# Patient Record
Sex: Female | Born: 1955
Health system: Southern US, Community
[De-identification: ages and names within clinical notes are randomized; demographics above are authoritative.]

## PROBLEM LIST (undated history)

## (undated) DIAGNOSIS — K219 Gastro-esophageal reflux disease without esophagitis: Secondary | ICD-10-CM

## (undated) DIAGNOSIS — E785 Hyperlipidemia, unspecified: Secondary | ICD-10-CM

## (undated) DIAGNOSIS — R1110 Vomiting, unspecified: Secondary | ICD-10-CM

## (undated) DIAGNOSIS — I10 Essential (primary) hypertension: Secondary | ICD-10-CM

## (undated) HISTORY — PX: TUBAL LIGATION: SHX77

## (undated) HISTORY — PX: COLONOSCOPY: SHX174

## (undated) HISTORY — DX: Vomiting, unspecified: R11.10

## (undated) HISTORY — DX: Essential (primary) hypertension: I10

## (undated) HISTORY — DX: Hyperlipidemia, unspecified: E78.5

## (undated) HISTORY — DX: Gastro-esophageal reflux disease without esophagitis: K21.9

---

## 2001-05-01 ENCOUNTER — Ambulatory Visit (HOSPITAL_COMMUNITY): Admission: RE | Admit: 2001-05-01 | Discharge: 2001-05-01 | Payer: Self-pay

## 2001-05-01 ENCOUNTER — Encounter: Payer: Self-pay | Admitting: Internal Medicine

## 2001-11-28 ENCOUNTER — Other Ambulatory Visit: Admission: RE | Admit: 2001-11-28 | Discharge: 2001-11-28 | Payer: Self-pay | Admitting: Obstetrics and Gynecology

## 2002-05-04 ENCOUNTER — Encounter: Payer: Self-pay | Admitting: Internal Medicine

## 2002-05-04 ENCOUNTER — Ambulatory Visit (HOSPITAL_COMMUNITY): Admission: RE | Admit: 2002-05-04 | Discharge: 2002-05-04 | Payer: Self-pay | Admitting: Internal Medicine

## 2003-05-05 ENCOUNTER — Ambulatory Visit (HOSPITAL_COMMUNITY): Admission: RE | Admit: 2003-05-05 | Discharge: 2003-05-05 | Payer: Self-pay | Admitting: Internal Medicine

## 2003-05-05 ENCOUNTER — Encounter: Payer: Self-pay | Admitting: Internal Medicine

## 2004-05-08 ENCOUNTER — Ambulatory Visit (HOSPITAL_COMMUNITY): Admission: RE | Admit: 2004-05-08 | Discharge: 2004-05-08 | Payer: Self-pay | Admitting: Internal Medicine

## 2004-11-23 ENCOUNTER — Ambulatory Visit (HOSPITAL_COMMUNITY): Admission: RE | Admit: 2004-11-23 | Discharge: 2004-11-23 | Payer: Self-pay | Admitting: Internal Medicine

## 2004-11-23 ENCOUNTER — Ambulatory Visit: Payer: Self-pay | Admitting: Internal Medicine

## 2005-05-09 ENCOUNTER — Ambulatory Visit (HOSPITAL_COMMUNITY): Admission: RE | Admit: 2005-05-09 | Discharge: 2005-05-09 | Payer: Self-pay | Admitting: Internal Medicine

## 2006-05-14 ENCOUNTER — Ambulatory Visit (HOSPITAL_COMMUNITY): Admission: RE | Admit: 2006-05-14 | Discharge: 2006-05-14 | Payer: Self-pay | Admitting: Internal Medicine

## 2007-05-16 ENCOUNTER — Ambulatory Visit (HOSPITAL_COMMUNITY): Admission: RE | Admit: 2007-05-16 | Discharge: 2007-05-16 | Payer: Self-pay | Admitting: Internal Medicine

## 2008-02-04 ENCOUNTER — Other Ambulatory Visit: Admission: RE | Admit: 2008-02-04 | Discharge: 2008-02-04 | Payer: Self-pay | Admitting: Obstetrics and Gynecology

## 2008-05-24 ENCOUNTER — Ambulatory Visit (HOSPITAL_COMMUNITY): Admission: RE | Admit: 2008-05-24 | Discharge: 2008-05-24 | Payer: Self-pay | Admitting: Internal Medicine

## 2009-02-28 ENCOUNTER — Other Ambulatory Visit: Admission: RE | Admit: 2009-02-28 | Discharge: 2009-02-28 | Payer: Self-pay | Admitting: Obstetrics and Gynecology

## 2009-05-25 ENCOUNTER — Ambulatory Visit (HOSPITAL_COMMUNITY): Admission: RE | Admit: 2009-05-25 | Discharge: 2009-05-25 | Payer: Self-pay | Admitting: Internal Medicine

## 2010-04-18 ENCOUNTER — Other Ambulatory Visit: Admission: RE | Admit: 2010-04-18 | Discharge: 2010-04-18 | Payer: Self-pay | Admitting: Obstetrics and Gynecology

## 2010-05-29 ENCOUNTER — Ambulatory Visit (HOSPITAL_COMMUNITY): Admission: RE | Admit: 2010-05-29 | Discharge: 2010-05-29 | Payer: Self-pay | Admitting: Internal Medicine

## 2010-10-12 ENCOUNTER — Ambulatory Visit (HOSPITAL_COMMUNITY): Admission: RE | Admit: 2010-10-12 | Discharge: 2010-10-12 | Payer: Self-pay | Admitting: Internal Medicine

## 2010-10-17 HISTORY — PX: ESOPHAGOGASTRODUODENOSCOPY: SHX1529

## 2010-11-07 ENCOUNTER — Ambulatory Visit: Payer: Self-pay | Admitting: Internal Medicine

## 2010-11-07 DIAGNOSIS — K219 Gastro-esophageal reflux disease without esophagitis: Secondary | ICD-10-CM

## 2010-11-07 DIAGNOSIS — I10 Essential (primary) hypertension: Secondary | ICD-10-CM

## 2010-11-07 HISTORY — DX: Gastro-esophageal reflux disease without esophagitis: K21.9

## 2010-11-07 HISTORY — DX: Essential (primary) hypertension: I10

## 2010-11-08 ENCOUNTER — Ambulatory Visit: Payer: Self-pay | Admitting: Internal Medicine

## 2010-11-08 ENCOUNTER — Ambulatory Visit (HOSPITAL_COMMUNITY): Admission: RE | Admit: 2010-11-08 | Discharge: 2010-11-08 | Payer: Self-pay | Admitting: Internal Medicine

## 2010-11-13 ENCOUNTER — Encounter (HOSPITAL_COMMUNITY)
Admission: RE | Admit: 2010-11-13 | Discharge: 2010-12-13 | Payer: Self-pay | Source: Home / Self Care | Attending: Internal Medicine | Admitting: Internal Medicine

## 2010-11-16 ENCOUNTER — Ambulatory Visit (HOSPITAL_COMMUNITY): Admission: RE | Admit: 2010-11-16 | Discharge: 2010-11-16 | Payer: Self-pay | Admitting: Internal Medicine

## 2010-12-25 ENCOUNTER — Ambulatory Visit
Admission: RE | Admit: 2010-12-25 | Discharge: 2010-12-25 | Payer: Self-pay | Source: Home / Self Care | Attending: Internal Medicine | Admitting: Internal Medicine

## 2011-01-10 HISTORY — PX: BRAVO PH STUDY: SHX5421

## 2011-03-19 ENCOUNTER — Ambulatory Visit (INDEPENDENT_AMBULATORY_CARE_PROVIDER_SITE_OTHER): Payer: 59 | Admitting: Internal Medicine

## 2011-03-19 DIAGNOSIS — R220 Localized swelling, mass and lump, head: Secondary | ICD-10-CM

## 2011-04-02 NOTE — Consult Note (Signed)
NAMEEARLY, ORD                ACCOUNT NO.:  [id number]  MEDICAL RECORD NO.:  [id number]           PATIENT TYPE: AMB.  LOCATION: [place].                    [facility]  PHYSICIAN:  Lionel December, M.D.    DATE OF BIRTH:  18-Feb-1956  DATE :  03/19/2011                                 CONSULTATION   PRESENTING COMPLAINT:  Persistent problems with throat, feeling of lump and/or fluid.  SUBJECTIVE:  Dawn Stafford is a 55 year old African American female, patient of Dr. Carylon Stafford, who is here for a scheduled visit.  She was initially seen in [facility] office in November for throat symptoms.  She states symptoms began soon after she had oral surgery in November 2009.  She had 2 first teeth extracted along with excision of a bone cyst.  Ever since she has had what she described as fizz in her throat.  She also feels as if she has a lump in her throat or fluid.  Only time she does not have this symptom is when she is actually eating food or drinking fluids.  Lot of times, she has to spit it, it is always clear frothy liquid. Occasionally, she may see food particles.  She states she has been able to sleep though.  She was treated for GERD by Dr. Ouida Stafford, but it did not make any difference.  She had upper GI series in October 2011 and there was a question of tiny Zenker's diverticulum and she had single episode of the GE reflux.  She underwent esophagogastrojejunoscopy in November 2011 and it was a normal exam.  She was subsequently evaluated by Ms. Dawn Stafford of speech pathology and no abnormality was noted.  She did have modified barium swallow. Finally, she was referred to Dawn Stafford, where she had esophageal pH study and impedance on January 10, 2011.  This study was performed off PPI and it was a normal study.  I talked with Dr. Ouida Stafford and he decided to treat her with gabapentin thinking that she may have a glossopharyngeal neuropathy or nerve damage.  Dawn Stafford states that she was begun  on Dipentum 3 weeks ago and she is feeling better.  She states her symptom is not as intense and she is able to rest better at night.  She has not experienced any side effects with therapy.  She still brings out frothy liquid at times.  She is not having any dysphagia.  She states she seemed to have less problem when she is lying supine and also when she is uses chewing gum.  She is maintaining her weight though.  CURRENT MEDICATIONS:  Hydrochlorothiazide 25 mg daily, simvastatin 40 mg daily, Xanax 0.5 mg t.i.d. p.r.n., ASA 81 mg daily, gabapentin 300 mg p.o. b.i.d.  OBJECTIVE:  VITAL SIGNS:  Weight 117 pounds, she 66 inches tall, pulse 68 per minute, blood pressure 98/70, temperature is 98. HEENT:  Conjunctivae are pink.  Sclerae are nonicteric.  Oral pharyngeal mucosa is normal.  Pharyngeal reflex is intact. NECK:  No neck masses or thyromegaly noted. ABDOMEN:  Flat, soft, and nontender without organomegaly or masses.  ASSESSMENT:  Persistent feeling of lump in her throat as  well as inability to clear liquids or saliva from her throat.  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  I suspect it must be related to oral surgery that she had in November 2009.  She has had fairly extensive workup and number of conditions have been ruled out.  It is possible that her symptoms may gradually resolve.  PLAN:  She will continue gabapentin at present dose.  Unless she has problem, she will return for OV in 4 months.  If symptoms persist, may consider repeating a barium study in 1 year.     Lionel December, M.D.     NR/MEDQ  D:  03/20/2011  T:  03/20/2011  Job:  [id number]  cc:   Kingsley Callander. Dawn Sills, MD Fax: [phone]  Electronically Signed by Lionel December M.D. on 04/01/2011 11:20:23 PM

## 2011-04-19 ENCOUNTER — Other Ambulatory Visit (INDEPENDENT_AMBULATORY_CARE_PROVIDER_SITE_OTHER): Payer: Self-pay | Admitting: Internal Medicine

## 2011-04-20 ENCOUNTER — Emergency Department (HOSPITAL_COMMUNITY)
Admission: EM | Admit: 2011-04-20 | Discharge: 2011-04-20 | Disposition: A | Discharge status: Home / Self Care | Payer: PRIVATE HEALTH INSURANCE | Attending: Emergency Medicine | Admitting: Emergency Medicine

## 2011-04-20 ENCOUNTER — Emergency Department (HOSPITAL_COMMUNITY): Payer: PRIVATE HEALTH INSURANCE

## 2011-04-20 DIAGNOSIS — S6990XA Unspecified injury of unspecified wrist, hand and finger(s), initial encounter: Secondary | ICD-10-CM | POA: Insufficient documentation

## 2011-04-20 DIAGNOSIS — R5383 Other fatigue: Secondary | ICD-10-CM | POA: Insufficient documentation

## 2011-04-20 DIAGNOSIS — Z79899 Other long term (current) drug therapy: Secondary | ICD-10-CM | POA: Insufficient documentation

## 2011-04-20 DIAGNOSIS — R5381 Other malaise: Secondary | ICD-10-CM | POA: Insufficient documentation

## 2011-04-20 DIAGNOSIS — S59909A Unspecified injury of unspecified elbow, initial encounter: Secondary | ICD-10-CM | POA: Insufficient documentation

## 2011-04-20 DIAGNOSIS — R42 Dizziness and giddiness: Secondary | ICD-10-CM | POA: Insufficient documentation

## 2011-04-20 DIAGNOSIS — M25539 Pain in unspecified wrist: Secondary | ICD-10-CM | POA: Insufficient documentation

## 2011-04-20 DIAGNOSIS — I10 Essential (primary) hypertension: Secondary | ICD-10-CM | POA: Insufficient documentation

## 2011-04-20 DIAGNOSIS — M25569 Pain in unspecified knee: Secondary | ICD-10-CM | POA: Insufficient documentation

## 2011-04-20 DIAGNOSIS — W010XXA Fall on same level from slipping, tripping and stumbling without subsequent striking against object, initial encounter: Secondary | ICD-10-CM | POA: Insufficient documentation

## 2011-04-20 DIAGNOSIS — Y9269 Other specified industrial and construction area as the place of occurrence of the external cause: Secondary | ICD-10-CM | POA: Insufficient documentation

## 2011-04-20 LAB — BASIC METABOLIC PANEL
BUN: 24 mg/dL — ABNORMAL HIGH (ref 6–23)
CO2: 32 mEq/L (ref 19–32)
Calcium: 9.4 mg/dL (ref 8.4–10.5)
Chloride: 99 mEq/L (ref 96–112)
GFR calc Af Amer: >60 mL/min (ref >60)
GFR calc non Af Amer: >60 mL/min (ref >60)
Potassium: 2.8 mEq/L — ABNORMAL LOW (ref 3.5–5.1)

## 2011-04-20 LAB — CBC
HCT: 40.6 % (ref 36.0–46.0)
MCH: 33.7 pg (ref 26.0–34.0)
MCHC: 33.7 g/dL (ref 30.0–36.0)
MCV: 99.8 fL (ref 78.0–100.0)
WBC: 4.7 10*3/uL (ref 4.0–10.5)

## 2011-04-20 LAB — DIFFERENTIAL
Basophils Absolute: 0 10*3/uL (ref 0.0–0.1)
Eosinophils Relative: 1 % (ref 0–5)
Lymphocytes Relative: 45 % (ref 12–46)
Lymphs Abs: 2.1 10*3/uL (ref 0.7–4.0)
Monocytes Absolute: 0.2 10*3/uL (ref 0.1–1.0)
Monocytes Relative: 5 % (ref 3–12)
Neutro Abs: 2.3 10*3/uL (ref 1.7–7.7)

## 2011-04-24 ENCOUNTER — Ambulatory Visit (HOSPITAL_COMMUNITY)
Admission: RE | Admit: 2011-04-24 | Discharge: 2011-04-24 | Disposition: A | Discharge status: Home / Self Care | Payer: 59 | Source: Ambulatory Visit | Attending: Internal Medicine | Admitting: Internal Medicine

## 2011-04-24 DIAGNOSIS — R1310 Dysphagia, unspecified: Secondary | ICD-10-CM | POA: Insufficient documentation

## 2011-04-24 MED ORDER — IOHEXOL 300 MG/ML  SOLN: 75.0000 mL | Freq: Once | INTRAMUSCULAR | Status: AC | PRN

## 2011-04-26 ENCOUNTER — Ambulatory Visit (INDEPENDENT_AMBULATORY_CARE_PROVIDER_SITE_OTHER): Payer: 59 | Admitting: Internal Medicine

## 2011-04-26 DIAGNOSIS — R1313 Dysphagia, pharyngeal phase: Secondary | ICD-10-CM

## 2011-05-04 NOTE — Op Note (Signed)
NAMEABBYGAIL, Stafford                ACCOUNT NO.:  [id number]   MEDICAL RECORD NO.:  [id number]          PATIENT TYPE:  AMB   LOCATION:  DAY                           FACILITY:  [facility]   PHYSICIAN:  Lionel December, M.D.    DATE OF BIRTH:  07-19-1956   DATE OF PROCEDURE:  11/23/2004  DATE OF DISCHARGE:                                 OPERATIVE REPORT   PROCEDURE:  Total colonoscopy.   INDICATIONS:  Joanny is a 55 year old African-American female who is here for  screening colonoscopy.  Family history is negative for colorectal carcinoma.  The procedure risks were reviewed with the patient and informed consent was  obtained.   PREMEDICATION:  Demerol 25 mg IV, Versed 6 mg IV.   FINDINGS:  Procedure performed in endoscopy suite.  The patient's vital  signs and O2 saturation were monitored during procedure and remained stable.  The patient was placed in the left lateral recumbent position and rectal  examination performed.  No abnormality noted on external or digital exam.  The Olympus video scope was placed in the rectum and advanced under vision  into sigmoid colon and beyond.  Preparation was satisfactory.  The scope was  advanced into cecum, which was identified by appendiceal orifice and  ileocecal valve.  Pictures taken for the record.  As the scope was  withdrawn, colonic mucosa was carefully examined and was normal throughout.  Rectal mucosa similarly was normal.  The scope was retroflexed to examine  the anorectal junction, and small hemorrhoids were noted below the dentate  line.  The endoscope was straightened and withdrawn.  The patient tolerated  the procedure well.   FINAL DIAGNOSIS:  Small external hemorrhoids, otherwise normal colonoscopy.   RECOMMENDATIONS:  1.  She will resume her usual medications.  2.  Yearly Hemoccults.  3.  She may consider next screening exam in 10 years from now.     Naje   NR/MEDQ  D:  11/23/2004  T:  11/23/2004  Job:  [id number]

## 2011-05-14 ENCOUNTER — Other Ambulatory Visit (HOSPITAL_COMMUNITY): Payer: Self-pay | Admitting: Internal Medicine

## 2011-05-14 ENCOUNTER — Other Ambulatory Visit (HOSPITAL_BASED_OUTPATIENT_CLINIC_OR_DEPARTMENT_OTHER): Payer: Self-pay | Admitting: Internal Medicine

## 2011-05-14 DIAGNOSIS — Z139 Encounter for screening, unspecified: Secondary | ICD-10-CM

## 2011-06-04 ENCOUNTER — Ambulatory Visit (HOSPITAL_COMMUNITY)
Admission: RE | Admit: 2011-06-04 | Discharge: 2011-06-04 | Disposition: A | Discharge status: Home / Self Care | Payer: 59 | Source: Ambulatory Visit | Attending: Internal Medicine | Admitting: Internal Medicine

## 2011-06-04 DIAGNOSIS — Z1231 Encounter for screening mammogram for malignant neoplasm of breast: Secondary | ICD-10-CM | POA: Insufficient documentation

## 2011-06-04 DIAGNOSIS — Z139 Encounter for screening, unspecified: Secondary | ICD-10-CM

## 2011-07-12 ENCOUNTER — Encounter (INDEPENDENT_AMBULATORY_CARE_PROVIDER_SITE_OTHER): Payer: Self-pay

## 2011-08-01 ENCOUNTER — Encounter (INDEPENDENT_AMBULATORY_CARE_PROVIDER_SITE_OTHER): Payer: Self-pay | Admitting: Internal Medicine

## 2011-08-01 ENCOUNTER — Ambulatory Visit (INDEPENDENT_AMBULATORY_CARE_PROVIDER_SITE_OTHER): Payer: 59 | Admitting: Internal Medicine

## 2011-08-01 VITALS — BP 100/64 | HR 72 | Temp 96.9°F | Ht 66.0 in | Wt 117.5 lb

## 2011-08-01 DIAGNOSIS — K219 Gastro-esophageal reflux disease without esophagitis: Secondary | ICD-10-CM

## 2011-08-01 NOTE — Progress Notes (Signed)
Subjective:     Patient ID: Dawn Stafford, female   DOB: 03/19/1956, 55 y.o.   MRN: [id number]  HPI Dawn Stafford is a 54 yr old female c/o that she is having acid reflux.   She underwent and PH and Impedence study Iin January of this year and both were normal.  There is poor symptom correlation for all symptoms. This study does not suggest that acid or non-acid gastroesophageal reflux accounts for this patients symptoms.Food feels like  they sitting in stomach and then she will have acid reflux.  She says acid reflux bubbles up into her esophagus.   When she is lying down she does not have this sensation.  When she is walking the symptoms are worse.  Appetite is good. Her weight in 2009 per Dr. Alonza Smoker dictated note was 150. Her weight today is 117. Dr. Ouida Sills has stopped all of her medications for the time being.  No abdominal pain. She also underwent a CT soft tissue of the neck with CM 04/2011 which was normal  She has been on multiple PPIs  with treatment failure.  EGD in October of last year was essential normal. She has also been evaluated by Ms. Havery Moros of Speech Pathology and no abnormalities were noted.  Symptoms since November 2009 after undergoing oral surgery.  Frequent growling of stomach.    Review of Systems:  see hpi     Objective:   Physical ExamAlert and oriented. Skin warm and dry. Oral mucosa is moist. Natural teeth in good condition. Several missing in lower back from previous dental surgery Sclera anicteric, conjunctivae is pink. Thyroid not enlarged. No cervical lymphadenopathy. Lungs clear. Heart regular rate and rhythm.  Abdomen is soft. Bowel sounds are positive. No hepatomegaly. No abdominal masses felt. No tenderness.  No edema to lower extremities. Patient is alert and oriented.      Assessment:    GERD, refractory.   So far she has had a negative work up.     Plan:    I am going to try her again on Dexilant to see how she does.  She will call with a progress report in 2  weeks.   GERD diet given to patient.  May consider gastric emptying study.

## 2011-08-15 ENCOUNTER — Other Ambulatory Visit (INDEPENDENT_AMBULATORY_CARE_PROVIDER_SITE_OTHER): Payer: Self-pay | Admitting: *Deleted

## 2011-08-15 NOTE — Telephone Encounter (Signed)
Dawn Stafford came in today wanting dexilant samples she did ask about the pharmcy at [facility] i told her they do cover ppi s so she wanted to know if you would call over to [facility]cy and send her a presc of nexium 40 mg qty 90 with 3 refills for her to start taking i advised her we would fax presc over and for her to check with the pharmcy after Monday to see if its been filled and sent to  to pick up she said any questions just give her a call at home .

## 2011-08-16 ENCOUNTER — Other Ambulatory Visit (INDEPENDENT_AMBULATORY_CARE_PROVIDER_SITE_OTHER): Payer: Self-pay | Admitting: Internal Medicine

## 2011-08-16 DIAGNOSIS — Z8719 Personal history of other diseases of the digestive system: Secondary | ICD-10-CM

## 2011-08-16 MED ORDER — ESOMEPRAZOLE MAGNESIUM 40 MG PO CPDR: 40.0000 mg | DELAYED_RELEASE_CAPSULE | Freq: Every day | ORAL | Status: DC

## 2011-08-16 NOTE — Telephone Encounter (Signed)
Will call an Rx for Nexium to [facility].

## 2011-08-17 NOTE — Telephone Encounter (Signed)
Rx for Nedium has been sent to [facility]

## 2012-02-19 ENCOUNTER — Other Ambulatory Visit (HOSPITAL_COMMUNITY): Payer: Self-pay | Admitting: Internal Medicine

## 2012-02-20 ENCOUNTER — Other Ambulatory Visit: Payer: Self-pay | Admitting: Family Medicine

## 2012-02-25 ENCOUNTER — Ambulatory Visit (HOSPITAL_COMMUNITY)
Admission: RE | Admit: 2012-02-25 | Discharge: 2012-02-25 | Disposition: A | Discharge status: Home / Self Care | Payer: 59 | Source: Ambulatory Visit | Attending: Internal Medicine | Admitting: Internal Medicine

## 2012-02-25 DIAGNOSIS — R1310 Dysphagia, unspecified: Secondary | ICD-10-CM | POA: Insufficient documentation

## 2012-02-25 DIAGNOSIS — D259 Leiomyoma of uterus, unspecified: Secondary | ICD-10-CM | POA: Insufficient documentation

## 2012-02-25 DIAGNOSIS — R911 Solitary pulmonary nodule: Secondary | ICD-10-CM | POA: Insufficient documentation

## 2012-02-25 DIAGNOSIS — R634 Abnormal weight loss: Secondary | ICD-10-CM | POA: Insufficient documentation

## 2012-02-25 MED ORDER — IOHEXOL 300 MG/ML  SOLN
100.0000 mL | Freq: Once | INTRAMUSCULAR | Status: AC | PRN
  Administered 2012-02-25: 100 mL via INTRAVENOUS

## 2012-03-11 DIAGNOSIS — R859 Unspecified abnormal finding in specimens from digestive organs and abdominal cavity: Secondary | ICD-10-CM | POA: Insufficient documentation

## 2012-04-08 DIAGNOSIS — K6389 Other specified diseases of intestine: Secondary | ICD-10-CM | POA: Insufficient documentation

## 2012-05-08 ENCOUNTER — Other Ambulatory Visit (HOSPITAL_COMMUNITY): Payer: Self-pay | Admitting: Internal Medicine

## 2012-05-08 DIAGNOSIS — Z139 Encounter for screening, unspecified: Secondary | ICD-10-CM

## 2012-06-02 ENCOUNTER — Other Ambulatory Visit: Payer: Self-pay | Admitting: Adult Health

## 2012-06-02 ENCOUNTER — Other Ambulatory Visit (HOSPITAL_COMMUNITY)
Admission: RE | Admit: 2012-06-02 | Discharge: 2012-06-02 | Disposition: A | Discharge status: Home / Self Care | Payer: 59 | Source: Ambulatory Visit | Attending: Obstetrics and Gynecology | Admitting: Obstetrics and Gynecology

## 2012-06-02 DIAGNOSIS — Z1159 Encounter for screening for other viral diseases: Secondary | ICD-10-CM | POA: Insufficient documentation

## 2012-06-02 DIAGNOSIS — Z01419 Encounter for gynecological examination (general) (routine) without abnormal findings: Secondary | ICD-10-CM | POA: Insufficient documentation

## 2012-06-16 ENCOUNTER — Ambulatory Visit (HOSPITAL_COMMUNITY)
Admission: RE | Admit: 2012-06-16 | Discharge: 2012-06-16 | Disposition: A | Discharge status: Home / Self Care | Payer: 59 | Source: Ambulatory Visit | Attending: Internal Medicine | Admitting: Internal Medicine

## 2012-06-16 DIAGNOSIS — Z139 Encounter for screening, unspecified: Secondary | ICD-10-CM

## 2012-06-16 DIAGNOSIS — Z1231 Encounter for screening mammogram for malignant neoplasm of breast: Secondary | ICD-10-CM | POA: Insufficient documentation

## 2013-05-12 DIAGNOSIS — F458 Other somatoform disorders: Secondary | ICD-10-CM | POA: Insufficient documentation

## 2013-05-18 ENCOUNTER — Other Ambulatory Visit (HOSPITAL_COMMUNITY): Payer: Self-pay | Admitting: Internal Medicine

## 2013-05-18 DIAGNOSIS — Z139 Encounter for screening, unspecified: Secondary | ICD-10-CM

## 2013-06-22 ENCOUNTER — Ambulatory Visit (HOSPITAL_COMMUNITY)
Admission: RE | Admit: 2013-06-22 | Discharge: 2013-06-22 | Disposition: A | Discharge status: Home / Self Care | Payer: 59 | Source: Ambulatory Visit | Attending: Internal Medicine | Admitting: Internal Medicine

## 2013-06-22 DIAGNOSIS — Z139 Encounter for screening, unspecified: Secondary | ICD-10-CM

## 2013-06-22 DIAGNOSIS — Z1231 Encounter for screening mammogram for malignant neoplasm of breast: Secondary | ICD-10-CM | POA: Insufficient documentation

## 2013-08-10 ENCOUNTER — Other Ambulatory Visit: Payer: Self-pay | Admitting: Adult Health

## 2013-11-03 ENCOUNTER — Ambulatory Visit (INDEPENDENT_AMBULATORY_CARE_PROVIDER_SITE_OTHER): Payer: 59 | Admitting: Internal Medicine

## 2013-11-03 ENCOUNTER — Encounter (INDEPENDENT_AMBULATORY_CARE_PROVIDER_SITE_OTHER): Payer: Self-pay | Admitting: Internal Medicine

## 2013-11-03 VITALS — BP 116/72 | HR 74 | Temp 97.9°F | Resp 18 | Ht 67.0 in | Wt 119.8 lb

## 2013-11-03 DIAGNOSIS — K219 Gastro-esophageal reflux disease without esophagitis: Secondary | ICD-10-CM | POA: Insufficient documentation

## 2013-11-03 DIAGNOSIS — E785 Hyperlipidemia, unspecified: Secondary | ICD-10-CM | POA: Insufficient documentation

## 2013-11-03 DIAGNOSIS — I10 Essential (primary) hypertension: Secondary | ICD-10-CM | POA: Insufficient documentation

## 2013-11-03 DIAGNOSIS — R1110 Vomiting, unspecified: Secondary | ICD-10-CM

## 2013-11-03 NOTE — Patient Instructions (Addendum)
Take Reglan or metoclopramide 10 mg 4 times a day for one week. Eat six small meals daily rather than three Keep records of vomiting spells for one week. Let us know if you're able [place].

## 2013-11-03 NOTE — Progress Notes (Signed)
Presenting complaint;  Frequent regurgitation and or vomiting.  Subjective:  Patient is 57 year old African female who presents for scheduled visit. She continues to complain of multiple episodes of regurgitation and vomiting where she every day. She states [place] the beach she has a trash can close by as she vomits often. She has no difficulty swallowing and she also denies heartburn. She denies hematemesis melena or rectal bleeding. Vomitus generally consists of fluid and scant amount of food debris. She states she has gained a few pounds over the last year. And 1 point her weight was down to 110 pounds. She was initially evaluated in November 2011 for persistent throat symptoms felt to be atypical manifestation of GERD but she did not respond to any PPI. She had an esophagogastroduodenoscopy in December 2011 which was within normal limits. Around the same time she was also evaluated by speech pathologist and no abnormality was found.  In May 2012 she also had CT neck which was unremarkable. He was subsequently referred to [facility] and underwent multiple studies. She was eventually placed on metoclopramide. She was advised to keep the dose to twice daily in order to reduce risk of side effects. She does not feel that metoclopramide 10 mg twice a day as helping. She denies a chronic cough sore throat or hoarseness.   Current Medications: Current Outpatient Prescriptions  Medication Sig Dispense Refill  . amLODipine (NORVASC) 5 MG tablet Take 5 mg by mouth daily.      . metoCLOPramide (REGLAN) 10 MG tablet Take 10 mg by mouth. Patient states that when she takes this medication she will take 1 -2 as needed      . pantoprazole (PROTONIX) 40 MG tablet Take 40 mg by mouth daily.      . simvastatin (ZOCOR) 40 MG tablet Take 40 mg by mouth at bedtime.        Marland Kitchen esomeprazole (NEXIUM) 40 MG capsule Take 1 capsule (40 mg total) by mouth daily.  30 capsule  1   No current facility-administered  medications for this visit.     Objective: Blood pressure 116/72, pulse 74, temperature 97.9 F (36.6 C), temperature source Oral, resp. rate 18, height 5\' 7"  (1.702 m), weight 119 lb 12.8 oz (54.341 kg). Patient is alert and in no acute distress. Conjunctiva is pink. Sclera is nonicteric Oropharyngeal mucosa is normal. No neck masses or thyromegaly noted. Cardiac exam with regular rhythm normal S1 and S2. No murmur or gallop noted. Lungs are clear to auscultation. Abdomen symmetrical soft and nontender without organomegaly or masses.  No LE edema or clubbing noted.  Labs/studies Results: Chest and abdominopelvic CT from 02/25/2012 reviewed; Chest CT pertinent for air fluid level in esophagus.   Assessment: Patient has persistent symptoms of regurgitation and or vomiting. She has not responded to anti-reflux therapy including PPI and low-dose promotility agents. Findings of air fluid level in esophagus on CT chest from last year would suggest esophageal dysmotility given that structural abnormalities have been ruled out. Patient has undergone multiple studies at [facility] which are not available at this time. These studies need to be reviewed before further evaluation is considered.  Plan:  Will request records from [facility]. Patient advised to eat 6 small meals. Patient advised to take metoclopramide 10 mg 4 times a day for one week. She will keep symptom diary for one week. Patient also advised to find out if she can [place]per[place] from [place] which would be safer medication for her to take.  Office visit in 2 months.

## 2013-12-01 ENCOUNTER — Encounter (INDEPENDENT_AMBULATORY_CARE_PROVIDER_SITE_OTHER): Payer: Self-pay

## 2014-01-12 ENCOUNTER — Encounter (INDEPENDENT_AMBULATORY_CARE_PROVIDER_SITE_OTHER): Payer: Self-pay | Admitting: Internal Medicine

## 2014-01-12 ENCOUNTER — Ambulatory Visit (INDEPENDENT_AMBULATORY_CARE_PROVIDER_SITE_OTHER): Payer: 59 | Admitting: Internal Medicine

## 2014-01-12 VITALS — BP 98/70 | HR 72 | Temp 97.3°F | Resp 18 | Ht 67.0 in | Wt 124.0 lb

## 2014-01-12 DIAGNOSIS — K219 Gastro-esophageal reflux disease without esophagitis: Secondary | ICD-10-CM

## 2014-01-12 DIAGNOSIS — R1110 Vomiting, unspecified: Secondary | ICD-10-CM

## 2014-01-12 NOTE — Progress Notes (Signed)
Presenting complaint;  Followup for vomiting.  Subjective:  Patient is 58 year old African female who presents for scheduled visit. She was last seen on 11/03/2013. She took metoclopramide for one week could not tell any difference in episodes of regurgitation and vomiting. She has been on domperidone for over a month now and again not feeling any better. She has at least one or 2 episodes of postprandial regurgitation and vomiting. This always occurs within few minutes to an are of medial and preceded by shortening and rumbling in her abdomen. This symptom is more pronounced with certain foods such as beef. Her heartburn is well-controlled with therapy. She denies hoarseness cough or sore throat. She has gained another 5 pounds. Since he symptoms began in 2011 she dropped weight by 30 pounds down to 110 lbs. She denies abdominal pain melena rectal bleeding diarrhea or constipation. Last EGD and ED was in May 2014 by Dr. Jerene Pitch of [facility].  Current Medications: Current Outpatient Prescriptions  Medication Sig Dispense Refill  . amLODipine (NORVASC) 5 MG tablet Take 5 mg by mouth daily.      Marland Kitchen ibuprofen (ADVIL,MOTRIN) 200 MG tablet Take 200 mg by mouth as needed for mild pain.      . NONFORMULARY OR COMPOUNDED ITEM Domperidone 10 mg - Patient takes twice a day , 30 minutes before a meal.      . pantoprazole (PROTONIX) 40 MG tablet Take 40 mg by mouth daily.      . simvastatin (ZOCOR) 40 MG tablet Take 40 mg by mouth at bedtime.        . metoCLOPramide (REGLAN) 10 MG tablet Take 10 mg by mouth. Patient states that when she takes this medication she will take 1 -2 as needed       No current facility-administered medications for this visit.     Objective: Blood pressure 98/70, pulse 72, temperature 97.3 F (36.3 C), temperature source Oral, resp. rate 18, height 5\' 7"  (1.702 m), weight 124 lb (56.246 kg). Patient is alert and in no acute distress. Conjunctiva is pink. Sclera is  nonicteric Oropharyngeal mucosa is normal. No neck masses or thyromegaly noted. Cardiac exam with regular rhythm normal S1 and S2. No murmur or gallop noted. Lungs are clear to auscultation. Abdomen is flat soft and nontender without organomegaly or masses. No succussion splash noted across upper abdomen.  No LE edema or clubbing noted.  Labs/studies Results:   Assessment:  #1. Chronic/recurrent postprandial spells of regurgitation and vomiting without clearly established etiology. She has undergone multiple studies over the last 3 years including neck CT, EGD, esophageal manometry chest and abdominopelvic CT as well as small bowel follow-through and gastric emptying study. Four  Hour gastric emptying study at [facility] was normal. #2.GERD. Symptoms are well controlled with therapy.    Plan:  UGIS to rule out SMA syndrome. Discontinue domperidone. Can go back on it if symptoms get worse.

## 2014-01-12 NOTE — Patient Instructions (Addendum)
Physician will contact you with results of UGI series when completed

## 2014-01-20 ENCOUNTER — Ambulatory Visit (HOSPITAL_COMMUNITY)
Admission: RE | Admit: 2014-01-20 | Discharge: 2014-01-20 | Disposition: A | Discharge status: Home / Self Care | Payer: 59 | Source: Ambulatory Visit | Attending: Internal Medicine | Admitting: Internal Medicine

## 2014-01-20 DIAGNOSIS — I10 Essential (primary) hypertension: Secondary | ICD-10-CM | POA: Insufficient documentation

## 2014-01-20 DIAGNOSIS — K219 Gastro-esophageal reflux disease without esophagitis: Secondary | ICD-10-CM

## 2014-01-20 DIAGNOSIS — R1110 Vomiting, unspecified: Secondary | ICD-10-CM

## 2014-02-15 ENCOUNTER — Telehealth (INDEPENDENT_AMBULATORY_CARE_PROVIDER_SITE_OTHER): Payer: Self-pay | Admitting: *Deleted

## 2014-02-15 ENCOUNTER — Other Ambulatory Visit (INDEPENDENT_AMBULATORY_CARE_PROVIDER_SITE_OTHER): Payer: Self-pay | Admitting: *Deleted

## 2014-02-15 MED ORDER — PANTOPRAZOLE SODIUM 40 MG PO TBEC: 40.0000 mg | DELAYED_RELEASE_TABLET | Freq: Two times a day (BID) | ORAL | Status: DC

## 2014-02-15 NOTE — Telephone Encounter (Signed)
Refill request sent to [facility].

## 2014-02-15 NOTE — Telephone Encounter (Signed)
Spoke with Dr. Laural Golden this morning and he said he would send a refill in for her Portonix. Would like to make sure it is sent to [facility].

## 2014-02-15 NOTE — Telephone Encounter (Signed)
Patient needs refill on Pantoprazole send to [facility].

## 2014-06-28 ENCOUNTER — Other Ambulatory Visit (HOSPITAL_COMMUNITY): Payer: Self-pay | Admitting: Internal Medicine

## 2014-06-28 DIAGNOSIS — Z1231 Encounter for screening mammogram for malignant neoplasm of breast: Secondary | ICD-10-CM

## 2014-07-12 ENCOUNTER — Ambulatory Visit (HOSPITAL_COMMUNITY)
Admission: RE | Admit: 2014-07-12 | Discharge: 2014-07-12 | Disposition: A | Discharge status: Home / Self Care | Payer: 59 | Source: Ambulatory Visit | Attending: Internal Medicine | Admitting: Internal Medicine

## 2014-07-12 DIAGNOSIS — Z1231 Encounter for screening mammogram for malignant neoplasm of breast: Secondary | ICD-10-CM | POA: Insufficient documentation

## 2014-07-13 ENCOUNTER — Ambulatory Visit (INDEPENDENT_AMBULATORY_CARE_PROVIDER_SITE_OTHER): Payer: 59 | Admitting: Internal Medicine

## 2014-07-13 ENCOUNTER — Encounter (INDEPENDENT_AMBULATORY_CARE_PROVIDER_SITE_OTHER): Payer: Self-pay | Admitting: Internal Medicine

## 2014-07-13 VITALS — BP 110/74 | HR 72 | Temp 97.3°F | Resp 18 | Ht 67.0 in | Wt 122.9 lb

## 2014-07-13 DIAGNOSIS — K219 Gastro-esophageal reflux disease without esophagitis: Secondary | ICD-10-CM

## 2014-07-13 DIAGNOSIS — R1110 Vomiting, unspecified: Secondary | ICD-10-CM

## 2014-07-13 MED ORDER — AMITRIPTYLINE HCL 10 MG PO TABS: 20.0000 mg | ORAL_TABLET | Freq: Every day | ORAL | Status: DC

## 2014-07-13 NOTE — Progress Notes (Signed)
Presenting complaint;  Followup for GERD and vomiting.  Subjective:  Patient is 58 year old African female who presents for scheduled visit. She was last seen in January 2015 and underwent upper GI series with small bowel follow-through. Study was done primarily to rule out SMA syndrome and it was negative. She's been maintained on double dose PPI. She has lost 2 pounds since her last visit. She is hoping again made up to 140 lbs. She denies heartburn but continues to complain of regurgitation and vomiting. These symptoms occur within minutes of her meals. Both of these symptoms seem to get worse when she bends or stoops forward. Most of the time while she wants to regurgitate since take fluid and rarely food particles. She describes his material to be "detergent". She feels her appetite is good. Her bowels move every other day. She denies melena rectal bleeding or abdominal pain. She is interested in trying nortriptyline or similar medication.  Current Medications: Outpatient Encounter Prescriptions as of 07/13/2014  Medication Sig  . amLODipine (NORVASC) 5 MG tablet Take 5 mg by mouth daily.  Marland Kitchen ibuprofen (ADVIL,MOTRIN) 200 MG tablet Take 200 mg by mouth as needed for mild pain.  . pantoprazole (PROTONIX) 40 MG tablet Take 1 tablet (40 mg total) by mouth 2 (two) times daily.  . Probiotic Product (PROBIOTIC DAILY PO) Take by mouth daily.  . simvastatin (ZOCOR) 40 MG tablet Take 40 mg by mouth at bedtime.    . [DISCONTINUED] metoCLOPramide (REGLAN) 10 MG tablet Take 10 mg by mouth. Patient states that when she takes this medication she will take 1 -2 as needed  . [DISCONTINUED] NONFORMULARY OR COMPOUNDED ITEM Domperidone 10 mg - Patient takes twice a day , 30 minutes before a meal.     Objective: Blood pressure 110/74, pulse 72, temperature 97.3 F (36.3 C), temperature source Oral, resp. rate 18, height 5\' 7"  (1.702 m), weight 122 lb 14.4 oz (55.747 kg). Patient is alert and in no acute  distress. Conjunctiva is pink. Sclera is nonicteric Oropharyngeal mucosa is normal. No neck masses or thyromegaly noted. Cardiac exam with regular rhythm normal S1 and S2. No murmur or gallop noted. Lungs are clear to auscultation. Abdomen is flat. Bowel sounds are normal. On palpation abdomen is soft and nontender without organomegaly or masses.  No LE edema or clubbing noted.     Assessment:  #1. Frequent postprandial vomiting. She has undergone extensive evaluation both locally and at [facility] but etiology unclear. She underwent upper GI with small bowel follow-through in February this year and was negative for SMA syndrome. She possibly has on recognized motility disorder or these symptoms could be psychosomatic. #2. GERD. Heartburn is well controlled with therapy.    Plan:  Amitriptyline 10 mg by mouth daily; she will increase dose to 20 mg by mouth daily after one week if she has no side effects. Office visit in 3 months.

## 2014-07-13 NOTE — Patient Instructions (Signed)
Start with amitriptyline 10 mg by mouth daily; if no side effects experienced increased dose to 20 mg every evening. Call if you experience any side effects

## 2014-09-13 ENCOUNTER — Other Ambulatory Visit (INDEPENDENT_AMBULATORY_CARE_PROVIDER_SITE_OTHER): Payer: Self-pay | Admitting: Internal Medicine

## 2014-10-26 ENCOUNTER — Encounter (INDEPENDENT_AMBULATORY_CARE_PROVIDER_SITE_OTHER): Payer: Self-pay | Admitting: Internal Medicine

## 2014-10-26 ENCOUNTER — Ambulatory Visit (INDEPENDENT_AMBULATORY_CARE_PROVIDER_SITE_OTHER): Payer: 59 | Admitting: Internal Medicine

## 2014-10-26 VITALS — BP 110/68 | HR 72 | Temp 97.7°F | Resp 18 | Ht 67.0 in | Wt 123.8 lb

## 2014-10-26 DIAGNOSIS — K219 Gastro-esophageal reflux disease without esophagitis: Secondary | ICD-10-CM

## 2014-10-26 DIAGNOSIS — R112 Nausea with vomiting, unspecified: Secondary | ICD-10-CM

## 2014-10-26 DIAGNOSIS — K6389 Other specified diseases of intestine: Secondary | ICD-10-CM

## 2014-10-26 MED ORDER — METRONIDAZOLE 500 MG PO TABS: 500.0000 mg | ORAL_TABLET | Freq: Three times a day (TID) | ORAL | Status: DC

## 2014-10-26 NOTE — Progress Notes (Signed)
Presenting complaint;  Persistent symptoms of nausea vomiting abdominal rumbling and burping.  Subjective:  Patient is 58 year old African-American female who presents for scheduled visit. She was last seen on 07/13/2014. On her last visit she was begun on amitriptyline and dose was doubled to 20 mg daily. She states it helps her rest better at night but her symptoms persist. She complains of intense postprandial rumbling and vomiting. Vomiting occurs within a few minutes of each meal and usually involves passing fluid or bile and very rarely food debris. She denies heartburn or dysphagia. She also denies hematemesis or melena. She has not lost any weight since last visit. She reminds me that on her symptoms began after she had cyst removed from her jaw over 5 years ago. She takes ibuprofen occasionally. She does not smoke cigarettes or drink alcohol.     Current Medications: Outpatient Encounter Prescriptions as of 10/26/2014  Medication Sig  . amitriptyline (ELAVIL) 10 MG tablet Take 2 tablets (20 mg total) by mouth at bedtime.  Marland Kitchen amLODipine (NORVASC) 5 MG tablet Take 5 mg by mouth daily.  Marland Kitchen ibuprofen (ADVIL,MOTRIN) 200 MG tablet Take 200 mg by mouth as needed for mild pain.  . pantoprazole (PROTONIX) 40 MG tablet TAKE 1 TABLET BY MOUTH 2 TIMES DAILY.  . Probiotic Product (PROBIOTIC DAILY PO) Take by mouth daily.  . simvastatin (ZOCOR) 40 MG tablet Take 40 mg by mouth at bedtime.       Objective: Blood pressure 110/68, pulse 72, temperature 97.7 F (36.5 C), temperature source Oral, resp. rate 18, height 5\' 7"  (1.702 m), weight 123 lb 12.8 oz (56.155 kg). Patient is alert and in no acute distress. Conjunctiva is pink. Sclera is nonicteric Oropharyngeal mucosa is normal. No neck masses or thyromegaly noted. Cardiac exam with regular rhythm normal S1 and S2. No murmur or gallop noted. Lungs are clear to auscultation. Abdomen is symmetrical. Bowel sounds are normal. On palpation  abdomen is soft and non-tender without organomegaly or masses.  No LE edema or clubbing noted.  Prior studies and tests as follows; Upper GI series in October 2011 questioning small Zenker's diverticulum and revealed reflux. Normal EGD November 2011([facility]). Evaluation by speech pathologist in December 2011 Abdominopelvic CT in March 2013revealing air-fluid level in esophagus insistent with reflux or motility problem([facility]). Abnormal hydrogen breath test at [facility] consistent with small intestinal bacterial overgrowth. Normal colonoscopy in May 2013([facility]). Normal gastric emptying study in July 2013([facility]). Normal esophageal manometry in August 2013([facility]). Normal esophagogram in November 2013([facility]). EGD with ED in May 2014 at [facility] for another barium swallow suggesting distal esophageal narrowing. Normal UGI with small bowel follow-through on 01/20/2014 at [facility]    Assessment:  #1. Chronic nausea and vomiting. She has undergone extensive evaluation both locally and at [facility]. No therapy has provided any relief. She does not have gastroparesis or pyloric stenosis. Similarly small bowel study last year was normal. One has to wonder if it is due to motility disorder psychosomatic. #2. GERD.heartburn is well controlled with therapy. #3. History of small intestinal bacterial overgrowth.she does not have typical symptoms and remains to be seen if nausea vomiting and abdominal rumbling and burping will improve with therapy. .   Plan:  Metronidazole 500 mg by mouth 3 times a day for 2 weeks at which time patient will call with progress report. If she does not respond to therapy will consider increasing amitriptyline dose or trial with anxiolytic. Office visit in 3 months.

## 2014-10-26 NOTE — Patient Instructions (Signed)
Call office with progress report in 2 weeks. 

## 2014-11-09 ENCOUNTER — Telehealth (INDEPENDENT_AMBULATORY_CARE_PROVIDER_SITE_OTHER): Payer: Self-pay | Admitting: *Deleted

## 2014-11-09 NOTE — Telephone Encounter (Signed)
Progress report - The Metronidazole left her nauseated all day on Saturday (11/06/14) and lots of gas and pain on Sunday (11/07/14). Dawn Stafford took only one pill Sunday then started back the 3 pills on Monday (11/08/14) and has had not trouble yet through the day.  The medicine she and Dr. Laural Golden talked about is Baclofen (Liresal) 10 mg. She took 1/2 pill 3 times daily. She did all right at the time but didn't feel like it worked for her. Dr. Laural Golden said he was going to try this medicine again. Would like to know when he going to try this or does she need to finish the Metronidazole first. Her return phone number is [phone].

## 2014-11-09 NOTE — Telephone Encounter (Signed)
Per Dr.Rehman - the patient should complete the Flagyl , then contact us for the other prescription. Patient was called and made aware.

## 2014-11-15 ENCOUNTER — Other Ambulatory Visit (INDEPENDENT_AMBULATORY_CARE_PROVIDER_SITE_OTHER): Payer: Self-pay | Admitting: *Deleted

## 2014-11-15 ENCOUNTER — Telehealth (INDEPENDENT_AMBULATORY_CARE_PROVIDER_SITE_OTHER): Payer: Self-pay | Admitting: *Deleted

## 2014-11-15 NOTE — Telephone Encounter (Signed)
Dawn Stafford stopped by to let Dr. Laural Golden know she has finished the medicine he put her on and is ready for the Baclofen to be filled. Her return phone number [phone].

## 2014-11-15 NOTE — Telephone Encounter (Signed)
Patient presented to the office today and states that she had completed the Flagyl and is ready for the Baclofen. A prescription request was forwarded to [place].

## 2014-11-15 NOTE — Telephone Encounter (Signed)
A refill request was sent to Dr.Rehman. 

## 2014-11-16 ENCOUNTER — Other Ambulatory Visit (INDEPENDENT_AMBULATORY_CARE_PROVIDER_SITE_OTHER): Payer: Self-pay | Admitting: Internal Medicine

## 2014-11-16 MED ORDER — BACLOFEN 10 MG PO TABS: 5.0000 mg | ORAL_TABLET | Freq: Three times a day (TID) | ORAL | Status: DC

## 2014-11-16 NOTE — Telephone Encounter (Signed)
Prescription for baclofen 5 mg 3 times a day; 1 month supply with 2 refills sent to patient's pharmacy

## 2014-11-17 NOTE — Telephone Encounter (Signed)
Dr.Rehman has addressed this. Patient's prescription was sent to her Pharmacy.

## 2014-12-02 ENCOUNTER — Encounter (INDEPENDENT_AMBULATORY_CARE_PROVIDER_SITE_OTHER): Payer: Self-pay | Admitting: *Deleted

## 2014-12-02 ENCOUNTER — Encounter (INDEPENDENT_AMBULATORY_CARE_PROVIDER_SITE_OTHER): Payer: Self-pay

## 2014-12-13 ENCOUNTER — Telehealth (INDEPENDENT_AMBULATORY_CARE_PROVIDER_SITE_OTHER): Payer: Self-pay | Admitting: *Deleted

## 2014-12-13 NOTE — Telephone Encounter (Signed)
Patient states Baclofen 10 mg doesn't work, took for 1 month -- wants to know if ok to take the following OTC meds deglycyrrhizinated, enzyme aid, jarro-dophilus -- if ok to take these can she still take protonix --please advise -- call and leave message if ok to take --her number is [phone]

## 2014-12-20 NOTE — Telephone Encounter (Signed)
Okay to stop baclofen; continue pantoprazole and can use OTC medications Please call patient

## 2014-12-21 NOTE — Telephone Encounter (Signed)
Patient given Dr. Olevia Perches recommendation. She states that she is having hard time with reflux, it seems that all the medication for this acts the opposite with her. Ask what else she can do?

## 2014-12-21 NOTE — Telephone Encounter (Signed)
Per Dr.Rehman -  Okay to stop baclofen; continue pantoprazole and can use OTC medications Please call patient .

## 2014-12-21 NOTE — Telephone Encounter (Signed)
Patient called, uncertain that I was able to leave a voice message on her line. 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

## 2014-12-23 NOTE — Telephone Encounter (Signed)
Reviewed with Dr.Rehman on 12/22/14. He will review the patient's  info before making a recommendation.

## 2014-12-24 NOTE — Telephone Encounter (Signed)
Forwarded to [place] for a review.

## 2015-01-01 NOTE — Telephone Encounter (Signed)
Patient's call returned She states GERD symptoms are poorly controlled. She has office visit next month at which time will consider further options including anti-reflux surgery

## 2015-02-01 ENCOUNTER — Ambulatory Visit (INDEPENDENT_AMBULATORY_CARE_PROVIDER_SITE_OTHER): Payer: 59 | Admitting: Internal Medicine

## 2015-02-01 ENCOUNTER — Encounter (INDEPENDENT_AMBULATORY_CARE_PROVIDER_SITE_OTHER): Payer: Self-pay | Admitting: Internal Medicine

## 2015-02-01 ENCOUNTER — Ambulatory Visit (INDEPENDENT_AMBULATORY_CARE_PROVIDER_SITE_OTHER): Payer: Self-pay | Admitting: Internal Medicine

## 2015-02-01 VITALS — BP 108/70 | HR 66 | Temp 97.7°F | Resp 18 | Ht 67.0 in | Wt 124.1 lb

## 2015-02-01 DIAGNOSIS — K219 Gastro-esophageal reflux disease without esophagitis: Secondary | ICD-10-CM

## 2015-02-01 DIAGNOSIS — R112 Nausea with vomiting, unspecified: Secondary | ICD-10-CM

## 2015-02-01 DIAGNOSIS — R634 Abnormal weight loss: Secondary | ICD-10-CM

## 2015-02-01 MED ORDER — DICYCLOMINE HCL 10 MG PO CAPS: 10.0000 mg | ORAL_CAPSULE | Freq: Three times a day (TID) | ORAL | Status: DC

## 2015-02-01 MED ORDER — ONDANSETRON HCL 4 MG PO TABS: 4.0000 mg | ORAL_TABLET | Freq: Two times a day (BID) | ORAL | Status: DC | PRN

## 2015-02-01 NOTE — Patient Instructions (Signed)
Ondansetron 4 mg by mouth 30 minutes before breakfast daily for 1-2 weeks and then on as-needed basis.

## 2015-02-01 NOTE — Progress Notes (Signed)
Presenting complaint;  Follow-up for chronic nausea and vomiting. Weight loss.  Database;  Patient is 59 year old African female who's been having problems ever since she had cyst removed from her right mandible about 6 years ago. She has undergone extensive evaluation in the past and now returns for follow-up visit. Prior workup is summarized below.  Upper GI series in October 2011 questioning small Zenker's diverticulum and revealed reflux. Normal EGD November 2011([facility]). Evaluation by speech pathologist in December 2011 Abdominopelvic CT in March 2013revealing air-fluid level in esophagus insistent with reflux or motility problem([facility]). Abnormal hydrogen breath test at [facility] consistent with small intestinal bacterial overgrowth. Normal colonoscopy in May 2013([facility]). Normal gastric emptying study in July 2013([facility]). Normal esophageal manometry in August 2013([facility]). Normal esophagogram in November 2013([facility]). EGD with ED in May 2014 at [facility] for another barium swallow suggesting distal esophageal narrowing. Normal UGI with small bowel follow-through on 01/20/2014 at [facility]  Subjective:  Patient states she does not feel any better. On her last visit she was given prescription for metronidazole but it did not make any difference. She is also treated with amitriptyline which helped her rest better at night but did not ameliorate her symptoms. She continues to complain of postprandial nausea and vomiting. She says every time she achieved as rumbling and gurgling in her abdomen which is followed by nausea and vomiting. She vomits at least 2-3 times a day. She seemed to have more problem after lunch since she is at work in her job requires bending and stooping. She usually vomits frothy liquid and on occasion she vomits food debris. She denies hematemesis. She also denies heartburn dysphagia hoarseness or chronic cough. She has good appetite but afraid to eat. She seemed to have more problem with him  burgers than any other food. She has not lost any weight since her last. She used to weigh around 140 pounds before her mandible surgery. Her weight dropped down to 16 pounds in May 2012. Bowels move regularly. She denies melena or rectal bleeding.   Current Medications: Outpatient Encounter Prescriptions as of 02/01/2015  Medication Sig  . amLODipine (NORVASC) 5 MG tablet Take 5 mg by mouth daily.  Marland Kitchen ibuprofen (ADVIL,MOTRIN) 200 MG tablet Take 200 mg by mouth as needed for mild pain.  . pantoprazole (PROTONIX) 40 MG tablet TAKE 1 TABLET BY MOUTH 2 TIMES DAILY.  . Probiotic Product (PROBIOTIC DAILY PO) Take by mouth daily.  . simvastatin (ZOCOR) 40 MG tablet Take 40 mg by mouth at bedtime.    . [DISCONTINUED] amitriptyline (ELAVIL) 10 MG tablet Take 2 tablets (20 mg total) by mouth at bedtime. (Patient not taking: Reported on 02/01/2015)  . [DISCONTINUED] baclofen (LIORESAL) 10 MG tablet Take 0.5 tablets (5 mg total) by mouth 3 (three) times daily. (Patient not taking: Reported on 02/01/2015)  . [DISCONTINUED] metroNIDAZOLE (FLAGYL) 500 MG tablet Take 1 tablet (500 mg total) by mouth 3 (three) times daily. (Patient not taking: Reported on 02/01/2015)     Objective: Blood pressure 108/70, pulse 66, temperature 97.7 F (36.5 C), temperature source Oral, resp. rate 18, height 5\' 7"  (1.702 m), weight 124 lb 1.6 oz (56.291 kg). Patient is alert and in no acute distress. Conjunctiva is pink. Sclera is nonicteric Oropharyngeal mucosa is normal. No neck masses or thyromegaly noted. Cardiac exam with regular rhythm normal S1 and S2. No murmur or gallop noted. Lungs are clear to auscultation. Abdomen is symmetrical. Bowel sounds are normal. On palpation abdomen is soft and nontender. No succussion splash  noted over epigastric region.  No LE edema or clubbing noted.   Assessment:  #1. Chronic postprandial nausea and vomiting. She has not responded to various therapies including PPI, promotility  agent, baclofen and low-dose amitriptyline. Given her weight loss I was concerned that she may have SMA syndrome but this could not be confirmed on upper GI series formed one year ago. I believe she does not have prominent gastroesophageal reflux disease. She could have gastric arrhythmia or pyloric spasm. #2. Weight loss. Even though she is well below her baseline weight but she has gained 8 pounds from low of 116 pounds in May 2012.  Plan:  Ondansetron 4 mg by mouth twice a day when necessary. Patient advised to take 1 dose every morning for 1-2 weeks and then on as-needed basis. Dicyclomine 10 mg by mouth before each meal daily. Patient informed of potential side effects. Patient will call office with progress report in 4 weeks. If she does not feel any better will consider benzodiazepine. Office visit in 3 months.

## 2015-02-28 ENCOUNTER — Telehealth (INDEPENDENT_AMBULATORY_CARE_PROVIDER_SITE_OTHER): Payer: Self-pay | Admitting: *Deleted

## 2015-02-28 NOTE — Telephone Encounter (Signed)
Dawn Stafford came by the office wanting Dr. Laural Golden to know the Dicyclomine is not working for her. She would like to know if she could try the Omeprazole? Her return phone number is [phone], if not home please LM.

## 2015-03-02 NOTE — Telephone Encounter (Signed)
This will be addressed with Dr.Rehman. 

## 2015-03-04 ENCOUNTER — Other Ambulatory Visit (INDEPENDENT_AMBULATORY_CARE_PROVIDER_SITE_OTHER): Payer: Self-pay | Admitting: *Deleted

## 2015-03-04 MED ORDER — OMEPRAZOLE 40 MG PO CPDR: 40.0000 mg | DELAYED_RELEASE_CAPSULE | Freq: Two times a day (BID) | ORAL | Status: DC

## 2015-03-04 NOTE — Telephone Encounter (Signed)
Per Dr.Rehman the patient may start the Omeprazole 40 - ! By mouth twice a day. This was faxed to [facility]. Patient was made aware.

## 2015-03-04 NOTE — Telephone Encounter (Signed)
Per Dr.Rehman patient may take Omeprazole 40 mg take 1 by mouth twice a day. A request was sent to [facility] fro this #180 3 refills It may be that the patient will only be allowed to take 1 by mouth daily - 40 mg of Omeprazole. A message was left on the patient 's voice mail.

## 2015-04-06 ENCOUNTER — Telehealth (INDEPENDENT_AMBULATORY_CARE_PROVIDER_SITE_OTHER): Payer: Self-pay | Admitting: *Deleted

## 2015-04-06 NOTE — Telephone Encounter (Signed)
Unable to leave message

## 2015-04-06 NOTE — Telephone Encounter (Signed)
Dawn Stafford would like to get something for gas. She has tried OTC medicines and they do not work. The return phone number is [phone] and may LM.

## 2015-04-07 ENCOUNTER — Encounter (INDEPENDENT_AMBULATORY_CARE_PROVIDER_SITE_OTHER): Payer: Self-pay

## 2015-04-11 NOTE — Telephone Encounter (Signed)
Dawn Stafford said her pharmacy has not received a Rx for her gas pain. It has been several days since she has LM. Please return her call at [phone].

## 2015-04-11 NOTE — Telephone Encounter (Signed)
Message left at home home

## 2015-04-11 NOTE — Telephone Encounter (Signed)
I tried call Dawn Stafford x 2 last week. Unable to leave a message. She may try Simethicone OTC and see if this helps.

## 2015-04-25 ENCOUNTER — Telehealth (INDEPENDENT_AMBULATORY_CARE_PROVIDER_SITE_OTHER): Payer: Self-pay | Admitting: *Deleted

## 2015-04-25 NOTE — Telephone Encounter (Signed)
Paiden would like to know if she could try Carafate. She is "really needing something to coat her stomach". The return phone number is [phone].

## 2015-04-26 ENCOUNTER — Other Ambulatory Visit (INDEPENDENT_AMBULATORY_CARE_PROVIDER_SITE_OTHER): Payer: Self-pay | Admitting: *Deleted

## 2015-04-26 MED ORDER — SUCRALFATE 1 G PO TABS: 1.0000 g | ORAL_TABLET | Freq: Four times a day (QID) | ORAL | Status: DC

## 2015-04-26 NOTE — Telephone Encounter (Signed)
A request for the Carafate has been sent to [facility]. Patient made aware.

## 2015-04-26 NOTE — Telephone Encounter (Signed)
Patient called an requested carafate to coat stomach. Per Dr.Rehman may try the tablets 1 month , 2 refills.

## 2015-05-03 ENCOUNTER — Ambulatory Visit (INDEPENDENT_AMBULATORY_CARE_PROVIDER_SITE_OTHER): Payer: 59 | Admitting: Internal Medicine

## 2015-05-03 ENCOUNTER — Encounter (INDEPENDENT_AMBULATORY_CARE_PROVIDER_SITE_OTHER): Payer: Self-pay | Admitting: Internal Medicine

## 2015-05-03 VITALS — BP 104/70 | HR 68 | Temp 97.0°F | Resp 18 | Ht 67.0 in | Wt 122.7 lb

## 2015-05-03 DIAGNOSIS — K219 Gastro-esophageal reflux disease without esophagitis: Secondary | ICD-10-CM

## 2015-05-03 DIAGNOSIS — R112 Nausea with vomiting, unspecified: Secondary | ICD-10-CM | POA: Diagnosis not present

## 2015-05-03 MED ORDER — SUCRALFATE 1 G PO TABS: 2.0000 g | ORAL_TABLET | Freq: Every day | ORAL | Status: DC

## 2015-05-03 MED ORDER — BETHANECHOL CHLORIDE 10 MG PO TABS: 20.0000 mg | ORAL_TABLET | Freq: Three times a day (TID) | ORAL | Status: DC

## 2015-05-03 NOTE — Patient Instructions (Signed)
Keep symptom diary until next office visit in 2 months. Call if he experiences side effects with urecholine or bethanechol

## 2015-05-03 NOTE — Progress Notes (Signed)
Presenting complaint;  Nausea vomiting and heartburn.  Subjective:  Dawn Stafford is 59-year-old African-American female who is here for scheduled visit. She was last seen 3 months ago. On her last visit she was given dicyclomine but could not tell any difference. Then she was tried on BuSpar which gave her nightmares. She does not feel any better. She has frequent postprandial regurgitation and vomits every day usually after lunch or evening meal and when she stoops or bends at work. She seemed to do the best when she is at home lying flat resting in bed. Her appetite is good but she is afraid to eat. She has lost 2 pounds his last visit. She denies sore throat cough or hoarseness. She also denies breathing difficulty melena or rectal bleeding. She is not sure if sucralfate is helping.   Current Medications: Outpatient Encounter Prescriptions as of 05/03/2015  Medication Sig  . amLODipine (NORVASC) 5 MG tablet Take 5 mg by mouth daily.  Marland Kitchen ibuprofen (ADVIL,MOTRIN) 200 MG tablet Take 200 mg by mouth as needed for mild pain.  Marland Kitchen omeprazole (PRILOSEC) 40 MG capsule Take 1 capsule (40 mg total) by mouth 2 (two) times daily.  . simvastatin (ZOCOR) 40 MG tablet Take 40 mg by mouth at bedtime.    . sucralfate (CARAFATE) 1 G tablet Take 1 tablet (1 g total) by mouth 4 (four) times daily.  Marland Kitchen ZOSTAVAX 88110 UNT/0.65ML injection   . [DISCONTINUED] dicyclomine (BENTYL) 10 MG capsule Take 1 capsule (10 mg total) by mouth 3 (three) times daily before meals. (Patient not taking: Reported on 05/03/2015)  . [DISCONTINUED] ondansetron (ZOFRAN) 4 MG tablet Take 1 tablet (4 mg total) by mouth 2 (two) times daily as needed for nausea or vomiting. (Patient not taking: Reported on 05/03/2015)  . [DISCONTINUED] pantoprazole (PROTONIX) 40 MG tablet TAKE 1 TABLET BY MOUTH 2 TIMES DAILY. (Patient not taking: Reported on 05/03/2015)   No facility-administered encounter medications on file as of 05/03/2015.    Objective: Blood  pressure 104/70, pulse 68, temperature 97 F (36.1 C), temperature source Oral, resp. rate 18, height 5\' 7"  (1.702 m), weight 122 lb 11.2 oz (55.656 kg). Patient is alert and in no acute distress. Conjunctiva is pink. Sclera is nonicteric Oropharyngeal mucosa is normal. No neck masses or thyromegaly noted. Cardiac exam with regular rhythm normal S1 and S2. No murmur or gallop noted. Lungs are clear to auscultation. Abdomen is symmetrical soft and nontender. No organomegaly or masses.  No LE edema or clubbing noted.    Assessment:  #1. Recurrent nausea and vomiting felt to be secondary to GERD unresponsive to therapy. She has undergone extensive workup locally as well as at [facility]. #2. Weight loss. She has lost 2 pounds since her last visit 3 months ago she is still 5 pounds better than she was in August 2012.  Plan:  Change sucralfate 2 g by mouth daily at bedtime. Bethanechol 20 mg by mouth before meals for 1 week followed by 20 mg by mouth before meals if no side effects. Office visit in 2 months

## 2015-06-07 ENCOUNTER — Telehealth (INDEPENDENT_AMBULATORY_CARE_PROVIDER_SITE_OTHER): Payer: Self-pay | Admitting: *Deleted

## 2015-06-07 NOTE — Telephone Encounter (Signed)
Dawn Stafford said she has been taking the Carafate for 5 weeks and the Bethanechol for 4 weeks. She can not see a difference with her digestive system. Please give her a call at [phone].

## 2015-06-07 NOTE — Telephone Encounter (Signed)
Forwarded to Dr.Rehman to address. 

## 2015-06-23 NOTE — Telephone Encounter (Signed)
Dawn Stafford presented to the office stating Dr. Laural Golden called her and told her what medications to stop, that he would be calling her back. She has not heard back at this time. Dawn Stafford said she is needing something for her digestive system. The return phone number is [phone].

## 2015-06-24 NOTE — Telephone Encounter (Signed)
Dr.Rehman , Lucendia would like for you to return the call to her to discuss what else she may be able to try for her digestive system. [phone].

## 2015-06-30 NOTE — Telephone Encounter (Signed)
I was finally able to talk with patient. No therapy has made any difference and improvement in her symptoms. No recommendations at this time.

## 2015-07-06 ENCOUNTER — Ambulatory Visit (INDEPENDENT_AMBULATORY_CARE_PROVIDER_SITE_OTHER): Payer: 59 | Admitting: Internal Medicine

## 2015-07-06 ENCOUNTER — Encounter (INDEPENDENT_AMBULATORY_CARE_PROVIDER_SITE_OTHER): Payer: Self-pay | Admitting: Internal Medicine

## 2015-07-06 VITALS — BP 102/64 | HR 70 | Temp 98.2°F | Ht 66.0 in | Wt 122.9 lb

## 2015-07-06 DIAGNOSIS — R634 Abnormal weight loss: Secondary | ICD-10-CM | POA: Diagnosis not present

## 2015-07-06 DIAGNOSIS — R112 Nausea with vomiting, unspecified: Secondary | ICD-10-CM

## 2015-07-06 DIAGNOSIS — K219 Gastro-esophageal reflux disease without esophagitis: Secondary | ICD-10-CM | POA: Diagnosis not present

## 2015-07-06 MED ORDER — MEGESTROL ACETATE 40 MG PO TABS: 40.0000 mg | ORAL_TABLET | Freq: Every day | ORAL | Status: DC

## 2015-07-06 NOTE — Progress Notes (Signed)
   Subjective:    Patient ID: Dawn Stafford, female    DOB: June 28, 1956, 59 y.o.   MRN: [id number]  HPI Here today for f/u. She was last seen in May. Hx of nausea and vomiting, hx of GERD, wieight loss. She says she has had these symptoms since 2009. Her last weight here was 122. Today her weight is 122.9. She tells me she is still having frequent acid reflux. Symptoms started after she had a wisdom tooth pulled and a cyst removed under her gum.  She has stopped the Carafate and the Bethanechol 3-4 weeks ago.  She tells me she is till having acid reflux every day.  Her appetite is okay. She says she vomits at every meal.  She is eating 2 meals a day with a snack.  Normal PH study at [facility] was normal in 2012    11/06/2010 [id number] EGD. Dr Laural Golden    Review of Systems Past Medical History  Diagnosis Date  . GERD (gastroesophageal reflux disease)   . Hypertension 11/07/2010  . Acid reflux 11/07/2010    Past Surgical History  Procedure Laterality Date  . Esophagogastroduodenoscopy  10/2010  . Bravo ph study  01/10/2011    Dawn Stafford  . Colonoscopy      04/2013    No Known Allergies  Current Outpatient Prescriptions on File Prior to Visit  Medication Sig Dispense Refill  . amLODipine (NORVASC) 5 MG tablet Take 5 mg by mouth daily.    Marland Kitchen ibuprofen (ADVIL,MOTRIN) 200 MG tablet Take 200 mg by mouth as needed for mild pain.    Marland Kitchen omeprazole (PRILOSEC) 40 MG capsule Take 1 capsule (40 mg total) by mouth 2 (two) times daily. 180 capsule 3  . simvastatin (ZOCOR) 40 MG tablet Take 40 mg by mouth at bedtime.      Marland Kitchen ZOSTAVAX 58850 UNT/0.65ML injection   0  . bethanechol (URECHOLINE) 10 MG tablet Take 2 tablets (20 mg total) by mouth 3 (three) times daily before meals. (Patient not taking: Reported on 07/06/2015) 180 tablet 1  . sucralfate (CARAFATE) 1 G tablet Take 2 tablets (2 g total) by mouth at bedtime. (Patient not taking: Reported on 07/06/2015) 120 tablet 0   No current  facility-administered medications on file prior to visit.        Objective:   Physical ExamBlood pressure 102/64, pulse 70, temperature 98.2 F (36.8 C), height 5\' 6"  (1.676 m), weight 122 lb 14.4 oz (55.747 kg). Alert and oriented. Skin warm and dry. Oral mucosa is moist.   . Sclera anicteric, conjunctivae is pink. Thyroid not enlarged. No cervical lymphadenopathy. Lungs clear. Heart regular rate and rhythm.  Abdomen is soft. Bowel sounds are positive. No hepatomegaly. No abdominal masses felt. No tenderness.  No edema to lower extremities.          Assessment & Plan:  Weight loss. GERD. Am going to try her on Megace 40mg  daily. She has maintained her weight since May. OV in 6 weeks.

## 2015-07-06 NOTE — Patient Instructions (Signed)
Megace to her pharmacy. OV in 6 weeks.

## 2015-07-26 ENCOUNTER — Other Ambulatory Visit (HOSPITAL_COMMUNITY): Payer: Self-pay | Admitting: Internal Medicine

## 2015-07-26 DIAGNOSIS — Z1231 Encounter for screening mammogram for malignant neoplasm of breast: Secondary | ICD-10-CM

## 2015-08-01 NOTE — Telephone Encounter (Signed)
Talked with patient. She was seen by Dr. Alyson Ingles PA and she was advised to undergo repeat solid phase gastric emptying study esophageal manometry as well as pH study. Patient encouraged to proceed with studies as recommended

## 2015-08-03 ENCOUNTER — Ambulatory Visit (HOSPITAL_COMMUNITY)
Admission: RE | Admit: 2015-08-03 | Discharge: 2015-08-03 | Disposition: A | Discharge status: Home / Self Care | Payer: 59 | Source: Ambulatory Visit | Attending: Internal Medicine | Admitting: Internal Medicine

## 2015-08-03 DIAGNOSIS — Z1231 Encounter for screening mammogram for malignant neoplasm of breast: Secondary | ICD-10-CM | POA: Diagnosis present

## 2015-08-08 ENCOUNTER — Other Ambulatory Visit (HOSPITAL_COMMUNITY)
Admission: RE | Admit: 2015-08-08 | Discharge: 2015-08-08 | Disposition: A | Discharge status: Home / Self Care | Payer: 59 | Source: Ambulatory Visit | Attending: Adult Health | Admitting: Adult Health

## 2015-08-08 ENCOUNTER — Encounter: Payer: Self-pay | Admitting: Adult Health

## 2015-08-08 ENCOUNTER — Ambulatory Visit (INDEPENDENT_AMBULATORY_CARE_PROVIDER_SITE_OTHER): Payer: 59 | Admitting: Adult Health

## 2015-08-08 VITALS — BP 128/60 | HR 84 | Ht 66.0 in | Wt 124.0 lb

## 2015-08-08 DIAGNOSIS — Z01419 Encounter for gynecological examination (general) (routine) without abnormal findings: Secondary | ICD-10-CM | POA: Insufficient documentation

## 2015-08-08 DIAGNOSIS — Z1151 Encounter for screening for human papillomavirus (HPV): Secondary | ICD-10-CM | POA: Diagnosis present

## 2015-08-08 DIAGNOSIS — Z1212 Encounter for screening for malignant neoplasm of rectum: Secondary | ICD-10-CM | POA: Diagnosis not present

## 2015-08-08 LAB — HEMOCCULT GUIAC POC 1CARD (OFFICE): FECAL OCCULT BLD: NEGATIVE

## 2015-08-08 NOTE — Progress Notes (Signed)
Patient ID: Dawn Stafford, female   DOB: 1956-04-15, 59 y.o.   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 PCP is Dr Willey Blade.  Current Medications, Allergies, Past Medical History, Past Surgical History, Family History and Social History were reviewed in [employer] record.     Review of Systems: Patient denies any headaches, hearing loss, fatigue, blurred vision, shortness of breath, chest pain,, problems with bowel movements, urination, or intercourse. No joint pain or mood swings.See HPI for positives.Had colonoscopy 2 years ago.    Physical Exam:BP 128/60 mmHg  Pulse 84  Ht 5\' 6"  (1.676 m)  Wt 124 lb (56.246 kg)  BMI 20.02 kg/m2 General:  Well developed, well nourished, no acute distress Skin:  Warm and dry Neck:  Midline trachea, normal thyroid, good ROM, no lymphadenopathy Lungs; Clear to auscultation bilaterally Breast:  No dominant palpable mass, retraction, or nipple discharge Cardiovascular: Regular rate and rhythm Abdomen:  Soft, non tender, no hepatosplenomegaly Pelvic:  External genitalia is normal in appearance, no lesions.  The vagina is normal in appearance. Urethra has no lesions or masses. The cervix is smooth, pap with HPV performed.  Uterus is felt to be normal size, shape, and contour.  No adnexal masses or tenderness noted.Bladder is non tender, no masses felt. Rectal: Good sphincter tone, no polyps, or hemorrhoids felt.  Hemoccult negative. Extremities/musculoskeletal:  No swelling or varicosities noted, no clubbing or cyanosis Psych:  No mood changes, alert and cooperative,seems happy   Impression: Well woman gyn exam with pap    Plan: Mammogram yearly, last one 8/17 was normal Pap and  physical in 3 years , if pap normal with negative HPV Labs with PCP

## 2015-08-08 NOTE — Addendum Note (Signed)
Addended by: Linton Rump on: 08/08/2015 03:27 PM   Modules accepted: Orders

## 2015-08-08 NOTE — Patient Instructions (Signed)
Pap and physical in 3 year Mammogram yearly Labs with PCP

## 2015-08-10 LAB — CYTOLOGY - PAP

## 2015-08-17 ENCOUNTER — Ambulatory Visit (INDEPENDENT_AMBULATORY_CARE_PROVIDER_SITE_OTHER): Payer: Self-pay | Admitting: Internal Medicine

## 2015-12-20 MED FILL — AMLODIPINE BESYLATE 5 MG TA: 5 | 90 days supply | Qty: 90 | Fill #2

## 2016-01-06 MED FILL — PANTOPRAZOLE SOD DR 40 MG T: 40 | 90 days supply | Qty: 90 | Fill #1

## 2016-01-18 DIAGNOSIS — E785 Hyperlipidemia, unspecified: Secondary | ICD-10-CM | POA: Diagnosis not present

## 2016-01-18 DIAGNOSIS — K219 Gastro-esophageal reflux disease without esophagitis: Secondary | ICD-10-CM | POA: Diagnosis not present

## 2016-01-18 DIAGNOSIS — Z79899 Other long term (current) drug therapy: Secondary | ICD-10-CM | POA: Diagnosis not present

## 2016-01-18 DIAGNOSIS — I10 Essential (primary) hypertension: Secondary | ICD-10-CM | POA: Diagnosis not present

## 2016-01-31 DIAGNOSIS — E785 Hyperlipidemia, unspecified: Secondary | ICD-10-CM | POA: Diagnosis not present

## 2016-01-31 DIAGNOSIS — Z6820 Body mass index (BMI) 20.0-20.9, adult: Secondary | ICD-10-CM | POA: Diagnosis not present

## 2016-01-31 DIAGNOSIS — I10 Essential (primary) hypertension: Secondary | ICD-10-CM | POA: Diagnosis not present

## 2016-02-06 ENCOUNTER — Telehealth (INDEPENDENT_AMBULATORY_CARE_PROVIDER_SITE_OTHER): Payer: Self-pay | Admitting: Internal Medicine

## 2016-02-06 NOTE — Telephone Encounter (Signed)
Dawn Stafford called saying she's currently seeing a GI Specialist in [facility] and noticed they're trying to prescribe her the same medications that Dr. Laural Golden prescribed while she was a patient here. She'd like a print out of all the medications Dr. Laural Golden prescribed for her from 2015-present so she can show her current GI Provider. Please call the patient if needed.  Pt's ph# [phone]  Thank you.

## 2016-02-07 NOTE — Telephone Encounter (Signed)
Med list printed, left message for patient letting her know she can come by office and pick up

## 2016-02-14 MED FILL — SIMVASTATIN 20 MG TABLET: 20 | 90 days supply | Qty: 90 | Fill #3

## 2016-03-05 DIAGNOSIS — K210 Gastro-esophageal reflux disease with esophagitis: Secondary | ICD-10-CM | POA: Diagnosis not present

## 2016-03-09 MED FILL — DEXILANT DR 60 MG CAPSULE: 60 | 90 days supply | Qty: 90 | Fill #0

## 2016-03-19 MED FILL — AMLODIPINE BESYLATE 5 MG TA: 5 | 90 days supply | Qty: 90 | Fill #3

## 2016-05-01 ENCOUNTER — Ambulatory Visit (INDEPENDENT_AMBULATORY_CARE_PROVIDER_SITE_OTHER): Payer: 59 | Admitting: Internal Medicine

## 2016-05-01 ENCOUNTER — Encounter (INDEPENDENT_AMBULATORY_CARE_PROVIDER_SITE_OTHER): Payer: Self-pay | Admitting: Internal Medicine

## 2016-05-01 VITALS — BP 110/70 | HR 72 | Temp 97.4°F | Resp 18 | Ht 66.0 in | Wt 124.3 lb

## 2016-05-01 DIAGNOSIS — R634 Abnormal weight loss: Secondary | ICD-10-CM

## 2016-05-01 DIAGNOSIS — K219 Gastro-esophageal reflux disease without esophagitis: Secondary | ICD-10-CM

## 2016-05-01 DIAGNOSIS — R1110 Vomiting, unspecified: Secondary | ICD-10-CM | POA: Diagnosis not present

## 2016-05-01 MED ORDER — BETHANECHOL CHLORIDE 10 MG PO TABS: 10.0000 mg | ORAL_TABLET | Freq: Three times a day (TID) | ORAL | Status: DC

## 2016-05-01 MED FILL — BETHANECHOL 10 MG TABLET: 10 | 30 days supply | Qty: 120 | Fill #0

## 2016-05-01 NOTE — Patient Instructions (Signed)
Call with progress report in 2 weeks or earlier if you experience side effects with Bethanecol.

## 2016-05-01 NOTE — Progress Notes (Signed)
Presenting complaint;  Recurrent nausea vomiting and weight loss.  Subjective:  Dawn Stafford 60 year old African-American female who is here for scheduled visit. She was last seen on 07/06/2015 and was referred back to [facility] for reevaluation. She had smart pill study esophageal manometry pH with impedance as well as gastric emptying study results of which are reviewed under lab data. Patient was given prescription for Mestinon she stopped after taking for 5 weeks because it did not help. She continues to complain of postprandial nausea and vomiting she vomits virtually every day. She vomits frothy liquid and sometimes she vomits food and her bowel. She denies hematemesis. She also complains of rumbling in her abdomen but denies diarrhea melena or rectal bleeding. She stays away from fatty and fried foods which always result in heartburn and regurgitation. Her bowels move every other day. She has gained 2 pounds since her last visit. When her symptoms started 6 years ago her weight dropped to 114 pounds. Once again patient reminds me that her symptoms started sooner or she had large cyst removed from her right mandible.   Current Medications: Outpatient Encounter Prescriptions as of 05/01/2016  Medication Sig  . Alpha-D-Galactosidase (BEANO PO) Take by mouth as needed.  Marland Kitchen amLODipine (NORVASC) 5 MG tablet Take 5 mg by mouth daily.  Marland Kitchen ibuprofen (ADVIL,MOTRIN) 200 MG tablet Take 200 mg by mouth as needed for mild pain.  . simvastatin (ZOCOR) 20 MG tablet Take 20 mg by mouth daily.  . [DISCONTINUED] dicyclomine (BENTYL) 10 MG capsule Take 10 mg by mouth 4 (four) times daily -  before meals and at bedtime. Reported on 05/01/2016   No facility-administered encounter medications on file as of 05/01/2016.     Objective: Blood pressure 110/70, pulse 72, temperature 97.4 F (36.3 C), temperature source Oral, resp. rate 18, height 5\' 6"  (1.676 m), weight 124 lb 4.8 oz (56.382 kg). Patient is alert and in no  acute distress. Conjunctiva is pink. Sclera is nonicteric Oropharyngeal mucosa is normal. No neck masses or thyromegaly noted. Cardiac exam with regular rhythm normal S1 and S2. No murmur or gallop noted. Lungs are clear to auscultation. Abdomen is symmetrical. Bowel sounds are hyperactive. On palpation abdomen is soft and nontender without organomegaly or masses.  No LE edema or clubbing noted.  Labs/studies Results:  Patient had esophageal manometry along with pH study an impedance on 09/07/2015. Esophageal manometry revealed 70% swallows to be peristaltic and 30% were failed swallows.  LES pressure was normal at 34.7 with relaxation of 58% and residual pressure was 9.9   PH and impedance study. DeMeester score was 21.8(normal up to 10.7) She had 59 episodes of reflux and upper limit of normal 73. She had 120 episodes of regurgitation but reported only 29. 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 PH study was performed off PPI.  Smart pill study on 09/08/2015 Revealed normal gastric, small bowel and colonic transit time.  Gastric emptying study on 09/15/2015. Retention is 60 minutes was 60.9% Retention at 120 minutes was 21.5% Retention at 180 minutes was 9.4 % Retention at 240 minutes was 1.6%   Assessment:  #1. Chronic nausea and vomiting unresponsive to various therapies. She has undergone extensive evaluation which is summarized in my note of Fabry 2016. She had yet another evaluation at [facility] icterus essentially unremarkable other than abnormal esophageal manometry would not explain her symptoms. She has not responded to PPI, anxiolytics anti-spasmodics and she also had a short trial with bethanechol and more recently Mestinon provided  no relief. Since extensive workup has been negative one has to wonder if her symptoms are psychosomatic. #2. Weight loss. It is reassuring to note that she has gained 10 pounds despite her symptoms. #3. GERD. Heartburn is well controlled  with therapy.  Plan:  Bethanechol 10 mg by mouth before meals and daily at bedtime. She will call with progress report in 2 weeks. If she does not respond will escalate dose to 15 and then eventually to 20 mg 3-4 times a day. If she does not respond to this therapy will consider Lexapro. Office visit in 3 months.

## 2016-05-21 MED FILL — SIMVASTATIN 20 MG TABLET: 20 | 90 days supply | Qty: 90 | Fill #0

## 2016-06-04 ENCOUNTER — Other Ambulatory Visit (INDEPENDENT_AMBULATORY_CARE_PROVIDER_SITE_OTHER): Payer: Self-pay | Admitting: *Deleted

## 2016-06-04 ENCOUNTER — Telehealth (INDEPENDENT_AMBULATORY_CARE_PROVIDER_SITE_OTHER): Payer: Self-pay | Admitting: Internal Medicine

## 2016-06-04 NOTE — Telephone Encounter (Signed)
I will address this request with Dr.Rehman.

## 2016-06-04 NOTE — Telephone Encounter (Signed)
Patient presented to the office requesting a refill on Dexilant DR 60mg  capsule.  Dawn Stafford

## 2016-06-07 MED FILL — DEXILANT DR 60 MG CAPSULE: 60 | 90 days supply | Qty: 90 | Fill #1

## 2016-06-20 MED FILL — AMLODIPINE BESYLATE 5 MG TA: 5 | 90 days supply | Qty: 90 | Fill #4

## 2016-07-16 ENCOUNTER — Other Ambulatory Visit (HOSPITAL_COMMUNITY): Payer: Self-pay | Admitting: Internal Medicine

## 2016-07-16 DIAGNOSIS — Z1231 Encounter for screening mammogram for malignant neoplasm of breast: Secondary | ICD-10-CM

## 2016-07-24 DIAGNOSIS — I10 Essential (primary) hypertension: Secondary | ICD-10-CM | POA: Diagnosis not present

## 2016-07-24 DIAGNOSIS — K210 Gastro-esophageal reflux disease with esophagitis: Secondary | ICD-10-CM | POA: Diagnosis not present

## 2016-08-06 ENCOUNTER — Ambulatory Visit (HOSPITAL_COMMUNITY)
Admission: RE | Admit: 2016-08-06 | Discharge: 2016-08-06 | Disposition: A | Discharge status: Home / Self Care | Payer: 59 | Source: Ambulatory Visit | Attending: Internal Medicine | Admitting: Internal Medicine

## 2016-08-06 DIAGNOSIS — Z1231 Encounter for screening mammogram for malignant neoplasm of breast: Secondary | ICD-10-CM | POA: Insufficient documentation

## 2016-08-08 MED FILL — SIMVASTATIN 20 MG TABLET: 20 | 90 days supply | Qty: 90 | Fill #1

## 2016-08-14 ENCOUNTER — Ambulatory Visit (INDEPENDENT_AMBULATORY_CARE_PROVIDER_SITE_OTHER): Payer: 59 | Admitting: Internal Medicine

## 2016-08-14 ENCOUNTER — Encounter (INDEPENDENT_AMBULATORY_CARE_PROVIDER_SITE_OTHER): Payer: Self-pay | Admitting: Internal Medicine

## 2016-08-14 VITALS — BP 108/68 | HR 70 | Temp 97.6°F | Resp 18 | Ht 66.0 in | Wt 124.5 lb

## 2016-08-14 DIAGNOSIS — R112 Nausea with vomiting, unspecified: Secondary | ICD-10-CM | POA: Diagnosis not present

## 2016-08-14 DIAGNOSIS — K219 Gastro-esophageal reflux disease without esophagitis: Secondary | ICD-10-CM

## 2016-08-14 NOTE — Progress Notes (Signed)
Presenting complaint;  Follow-up for nausea and vomiting.  Subjective:  Dawn Stafford is 60 year old African-American female who is here for scheduled visit. She was last seen over 3 months ago and retreated with bethanechol. She says it did not make any difference. She continues to complain of postprandial nausea and vomiting. It occurs within 15 minutes. She states she vomits at least 3 times a day. He vomits frothy liquid and sometimes food fragments. She never vomits full meal. She rarely has heartburn. She denies hematemesis melena or rectal bleeding. She has not lost any weight since her last visit. She denies fever chills or night sweats. She says her last ultrasound was over 4 years ago.     Current Medications: Outpatient Encounter Prescriptions as of 08/14/2016  Medication Sig  . Alpha-D-Galactosidase (BEANO PO) Take by mouth as needed.  Marland Kitchen amLODipine (NORVASC) 5 MG tablet Take 5 mg by mouth daily.  Marland Kitchen DEXILANT 60 MG capsule Take 60 mg by mouth daily.   Marland Kitchen ibuprofen (ADVIL,MOTRIN) 200 MG tablet Take 200 mg by mouth as needed for mild pain.  . Multiple Vitamin (MULTIVITAMIN) tablet 1 tablet. Patient states that she takes for 4 weeks then will stop so that her body can take a break.  . simvastatin (ZOCOR) 20 MG tablet Take 20 mg by mouth daily.  . [DISCONTINUED] bethanechol (URECHOLINE) 10 MG tablet Take 1 tablet (10 mg total) by mouth 4 (four) times daily -  with meals and at bedtime. (Patient not taking: Reported on 08/14/2016)   No facility-administered encounter medications on file as of 08/14/2016.      Objective: Blood pressure 108/68, pulse 70, temperature 97.6 F (36.4 C), temperature source Oral, resp. rate 18, height 5\' 6"  (1.676 m), weight 124 lb 8 oz (56.5 kg). Well-developed thin female in NAD. Conjunctiva is pink. Sclera is nonicteric Oropharyngeal mucosa is normal. No neck masses or thyromegaly noted. Cardiac exam with regular rhythm normal S1 and S2. No murmur or gallop  noted. Lungs are clear to auscultation. Abdomen is flat and soft without tenderness organomegaly or masses.  No LE edema or clubbing noted.  Labs/studies Results:   multiple studies at [facility] summarized in my note from 05/01/2026 and will not be repeated.  Assessment:  #1. Chronic nausea and vomiting. She has undergone multiple studies both locally at [facility]. She had repeat evaluation at [facility] last year with EGD esophageal motility study gastric emptying etc. no therapy has provided any relief. She also has been tried on anxiolytic taken TCAs as well as metoclopramide but without symptomatic benefit. Need to make sure she does not have biliary tract disease. Last ultrasound was several years ago.  #2. Weight loss. Reassuring to know that she is maintaining her weight. At one point she was down 214 pounds and now she is stable at 124 pounds.    Plan:  Upper abdominal ultrasound. Patient CT multiple small meals rather than 3 meals a day. Bethanechol removed from the list of her medications.    '

## 2016-08-14 NOTE — Patient Instructions (Signed)
Upper abdominal ultrasound to be scheduled. 

## 2016-08-15 ENCOUNTER — Other Ambulatory Visit (INDEPENDENT_AMBULATORY_CARE_PROVIDER_SITE_OTHER): Payer: Self-pay | Admitting: Internal Medicine

## 2016-08-15 DIAGNOSIS — R112 Nausea with vomiting, unspecified: Secondary | ICD-10-CM

## 2016-08-17 ENCOUNTER — Ambulatory Visit (HOSPITAL_COMMUNITY)
Admission: RE | Admit: 2016-08-17 | Discharge: 2016-08-17 | Disposition: A | Discharge status: Home / Self Care | Payer: 59 | Source: Ambulatory Visit | Attending: Internal Medicine | Admitting: Internal Medicine

## 2016-08-17 DIAGNOSIS — R112 Nausea with vomiting, unspecified: Secondary | ICD-10-CM | POA: Diagnosis not present

## 2016-08-22 ENCOUNTER — Other Ambulatory Visit (INDEPENDENT_AMBULATORY_CARE_PROVIDER_SITE_OTHER): Payer: Self-pay | Admitting: Internal Medicine

## 2016-08-22 DIAGNOSIS — R112 Nausea with vomiting, unspecified: Secondary | ICD-10-CM

## 2016-08-24 ENCOUNTER — Ambulatory Visit (HOSPITAL_COMMUNITY): Payer: 59

## 2016-08-27 ENCOUNTER — Encounter (HOSPITAL_COMMUNITY): Payer: Self-pay

## 2016-08-27 ENCOUNTER — Encounter (HOSPITAL_COMMUNITY)
Admission: RE | Admit: 2016-08-27 | Discharge: 2016-08-27 | Disposition: A | Discharge status: Home / Self Care | Payer: 59 | Source: Ambulatory Visit | Attending: Internal Medicine | Admitting: Internal Medicine

## 2016-08-27 DIAGNOSIS — R112 Nausea with vomiting, unspecified: Secondary | ICD-10-CM | POA: Insufficient documentation

## 2016-08-27 MED ORDER — SODIUM CHLORIDE 0.9% FLUSH
INTRAVENOUS | Status: AC
  Filled 2016-08-27: qty 160

## 2016-08-27 MED ORDER — TECHNETIUM TC 99M MEBROFENIN IV KIT
5.0000 | PACK | Freq: Once | INTRAVENOUS | Status: AC | PRN
  Administered 2016-08-27: 5 via INTRAVENOUS

## 2016-08-28 ENCOUNTER — Other Ambulatory Visit (INDEPENDENT_AMBULATORY_CARE_PROVIDER_SITE_OTHER): Payer: Self-pay | Admitting: Internal Medicine

## 2016-08-28 MED ORDER — ONDANSETRON HCL 4 MG PO TABS: 4.0000 mg | ORAL_TABLET | Freq: Two times a day (BID) | ORAL | 0 refills | Status: DC

## 2016-08-28 MED FILL — DEXILANT DR 60 MG CAPSULE: 60 | 90 days supply | Qty: 90 | Fill #0

## 2016-08-29 MED FILL — ONDANSETRON HCL 4 MG TABLET: 4 | 30 days supply | Qty: 60 | Fill #0

## 2016-09-17 MED FILL — AMLODIPINE BESYLATE 5 MG TA: 5 | 90 days supply | Qty: 90 | Fill #0

## 2016-10-01 ENCOUNTER — Telehealth (INDEPENDENT_AMBULATORY_CARE_PROVIDER_SITE_OTHER): Payer: Self-pay | Admitting: Internal Medicine

## 2016-10-01 NOTE — Telephone Encounter (Signed)
Patient presented to the office, would like Dr. Laural Golden to call in a nerve pill for her.  She also wants to know if she can take the nerve pill and the  Dicyclomine 10mg .  [phone]

## 2016-10-01 NOTE — Telephone Encounter (Signed)
This will be addressed with Dr.Rehman on 10/02/2016.

## 2016-10-02 ENCOUNTER — Other Ambulatory Visit (INDEPENDENT_AMBULATORY_CARE_PROVIDER_SITE_OTHER): Payer: Self-pay | Admitting: *Deleted

## 2016-10-02 MED ORDER — ALPRAZOLAM 0.25 MG PO TABS
0.2500 mg | ORAL_TABLET | Freq: Three times a day (TID) | ORAL | 1 refills | Status: DC
Start: 2016-10-02 — End: 2017-06-10

## 2016-10-02 MED FILL — ALPRAZolam 0.25 MG TABS: 0.25 | 30 days supply | Qty: 90 | Fill #0

## 2016-10-02 NOTE — Telephone Encounter (Signed)
Per Dr.Rehman the patient may have called in Xanax 0.25 mg - the patient will take 1 by mouth 3 times daily before a meal. This was sent to the patient's pharmacy. Patient will be made aware.

## 2016-11-14 ENCOUNTER — Telehealth (INDEPENDENT_AMBULATORY_CARE_PROVIDER_SITE_OTHER): Payer: Self-pay | Admitting: Internal Medicine

## 2016-11-14 NOTE — Telephone Encounter (Signed)
Patient also wants to know when she needs to come back.  I did not see that indicated in the office note from August.

## 2016-11-14 NOTE — Telephone Encounter (Signed)
Patient presented to the office, stated that the Alprazolam .25mg  is making her so sick.  Suggestions for something else.  She sometimes has pain in the left lower part of there stomach that comes and goes.    [phone]

## 2016-11-15 MED FILL — SIMVASTATIN 20 MG TABLET: 20 | 90 days supply | Qty: 90 | Fill #2

## 2016-11-15 NOTE — Telephone Encounter (Signed)
Reviewed with Dr.Rehman. Awaiting his response.

## 2016-11-26 MED FILL — DEXILANT DR 60 MG CAPSULE: 60 | 90 days supply | Qty: 90 | Fill #1

## 2016-12-12 ENCOUNTER — Other Ambulatory Visit (INDEPENDENT_AMBULATORY_CARE_PROVIDER_SITE_OTHER): Payer: Self-pay | Admitting: *Deleted

## 2016-12-12 ENCOUNTER — Telehealth (INDEPENDENT_AMBULATORY_CARE_PROVIDER_SITE_OTHER): Payer: Self-pay | Admitting: Internal Medicine

## 2016-12-12 MED ORDER — ONDANSETRON HCL 4 MG PO TABS: 4.0000 mg | ORAL_TABLET | Freq: Two times a day (BID) | ORAL | 1 refills | Status: DC

## 2016-12-12 MED FILL — ONDANSETRON HCL 4 MG TABLET: 4 | 15 days supply | Qty: 30 | Fill #0

## 2016-12-12 NOTE — Telephone Encounter (Signed)
Rx was sent to [facility]. Patient was called and a message was left on her voicemail.

## 2016-12-12 NOTE — Telephone Encounter (Signed)
Patient presented to the ov today and ask about this medication for her nausea. Per Dr.Rehman patient may try this.

## 2016-12-12 NOTE — Telephone Encounter (Signed)
Patient called, stated that someone told her about Zofran and she would like to try this and see what it does.  [phone]

## 2016-12-18 MED FILL — AMLODIPINE BESYLATE 5 MG TA: 5 | 90 days supply | Qty: 90 | Fill #1

## 2017-01-21 DIAGNOSIS — I10 Essential (primary) hypertension: Secondary | ICD-10-CM | POA: Diagnosis not present

## 2017-01-21 DIAGNOSIS — K219 Gastro-esophageal reflux disease without esophagitis: Secondary | ICD-10-CM | POA: Diagnosis not present

## 2017-01-21 DIAGNOSIS — E785 Hyperlipidemia, unspecified: Secondary | ICD-10-CM | POA: Diagnosis not present

## 2017-01-21 DIAGNOSIS — R1310 Dysphagia, unspecified: Secondary | ICD-10-CM | POA: Diagnosis not present

## 2017-01-21 DIAGNOSIS — Z79899 Other long term (current) drug therapy: Secondary | ICD-10-CM | POA: Diagnosis not present

## 2017-02-04 ENCOUNTER — Telehealth (INDEPENDENT_AMBULATORY_CARE_PROVIDER_SITE_OTHER): Payer: Self-pay | Admitting: Internal Medicine

## 2017-02-04 NOTE — Telephone Encounter (Signed)
Patient presented to the office, following up from the encounter on 11/14/16.  She wants to know if or when Dr. Laural Golden wants to see her.  She also stated that the Zofran did not help.  She would like something else for her nerves, said the 25mg  medication he gave her before was not for her, way too strong, she would like something different.  She wanted me to be sure to tell him to find something to help her.  [phone]

## 2017-02-04 NOTE — Telephone Encounter (Signed)
This will be addressed with Dr.Rehman 02/05/2017.

## 2017-02-06 NOTE — Telephone Encounter (Signed)
Dr.Rehman , please advise on this patient.

## 2017-02-07 NOTE — Telephone Encounter (Signed)
I would like for Dawn Stafford to talk with Dr. Willey Blade about starting on a nerve pill. Please call patient.

## 2017-02-08 NOTE — Telephone Encounter (Signed)
Patient was called and made aware, she states that she has an appointment with Dr.Fagan on Monday @02 /26/2018.

## 2017-02-11 DIAGNOSIS — E785 Hyperlipidemia, unspecified: Secondary | ICD-10-CM | POA: Diagnosis not present

## 2017-02-11 DIAGNOSIS — I10 Essential (primary) hypertension: Secondary | ICD-10-CM | POA: Diagnosis not present

## 2017-02-11 DIAGNOSIS — Z6820 Body mass index (BMI) 20.0-20.9, adult: Secondary | ICD-10-CM | POA: Diagnosis not present

## 2017-02-13 MED FILL — SIMVASTATIN 20 MG TABLET: 20 | 90 days supply | Qty: 90 | Fill #3

## 2017-03-01 MED FILL — PANTOPRAZOLE SOD DR 40 MG T: 40 | 30 days supply | Qty: 30 | Fill #0

## 2017-03-15 DIAGNOSIS — H5203 Hypermetropia, bilateral: Secondary | ICD-10-CM | POA: Diagnosis not present

## 2017-03-15 DIAGNOSIS — H52223 Regular astigmatism, bilateral: Secondary | ICD-10-CM | POA: Diagnosis not present

## 2017-03-15 DIAGNOSIS — H524 Presbyopia: Secondary | ICD-10-CM | POA: Diagnosis not present

## 2017-03-18 MED FILL — AMLODIPINE BESYLATE 5 MG TA: 5 | 90 days supply | Qty: 90 | Fill #2

## 2017-04-01 MED FILL — PANTOPRAZOLE SOD DR 40 MG T: 40 | 30 days supply | Qty: 30 | Fill #1

## 2017-04-29 MED FILL — PANTOPRAZOLE SOD DR 40 MG T: 40 | 30 days supply | Qty: 30 | Fill #2

## 2017-05-14 MED FILL — SIMVASTATIN 20 MG TABLET: 20 | 90 days supply | Qty: 90 | Fill #4

## 2017-05-24 MED FILL — PANTOPRAZOLE SOD DR 40 MG T: 40 | 30 days supply | Qty: 60 | Fill #0

## 2017-06-10 ENCOUNTER — Other Ambulatory Visit (INDEPENDENT_AMBULATORY_CARE_PROVIDER_SITE_OTHER): Payer: Self-pay | Admitting: Internal Medicine

## 2017-06-10 MED FILL — ALPRAZolam 0.25 MG TABS: 0.25 | 30 days supply | Qty: 90 | Fill #0

## 2017-06-17 MED FILL — AMLODIPINE BESYLATE 5 MG TA: 5 | 90 days supply | Qty: 90 | Fill #3

## 2017-06-25 MED FILL — PANTOPRAZOLE SOD DR 40 MG T: 40 | 30 days supply | Qty: 60 | Fill #1

## 2017-07-15 ENCOUNTER — Other Ambulatory Visit (HOSPITAL_COMMUNITY): Payer: Self-pay | Admitting: Internal Medicine

## 2017-07-15 DIAGNOSIS — Z1231 Encounter for screening mammogram for malignant neoplasm of breast: Secondary | ICD-10-CM

## 2017-07-26 MED FILL — PANTOPRAZOLE SOD DR 40 MG T: 40 | 90 days supply | Qty: 180 | Fill #2

## 2017-08-06 ENCOUNTER — Encounter (INDEPENDENT_AMBULATORY_CARE_PROVIDER_SITE_OTHER): Payer: Self-pay | Admitting: Internal Medicine

## 2017-08-06 ENCOUNTER — Ambulatory Visit (INDEPENDENT_AMBULATORY_CARE_PROVIDER_SITE_OTHER): Payer: 59 | Admitting: Internal Medicine

## 2017-08-06 VITALS — BP 130/60 | HR 76 | Temp 97.7°F | Ht 66.0 in | Wt 130.7 lb

## 2017-08-06 DIAGNOSIS — K219 Gastro-esophageal reflux disease without esophagitis: Secondary | ICD-10-CM | POA: Diagnosis not present

## 2017-08-06 DIAGNOSIS — R112 Nausea with vomiting, unspecified: Secondary | ICD-10-CM | POA: Diagnosis not present

## 2017-08-06 NOTE — Progress Notes (Signed)
   Subjective:    Patient ID: Dawn Stafford, female    DOB: 1956-04-12, 61 y.o.   MRN: [id number]  HPI Here today for f/u.  Follow by [facility] for her GERD, post prandial nausea. Last seen in August of 2017. Weight last year was 124.8. Today her weight is 130.7.  She is interest in E stem. She tells me someone at work at this procedure and the procedure was successful. She tells me she is doing pretty good.  She says she has a lot of gas in her abdomen and back.  She continues to have nausea and vomiting.  Her appetite is okay. She has gained about 6 pounds since her last visit.  BM are normal.  HIDA scan in 2017 was normal.  [place] in 2017 was normal.  Emptying study in 2013 was normal.  Edison study in 2016 completed off PPI dos not demonstrate pathologic reflux with ph testing alone.The number or reflux events detected by impedance is within normal limits.  Review of Systems Past Medical History:  Diagnosis Date  . Acid reflux 11/07/2010  . GERD (gastroesophageal reflux disease)   . Hypertension 11/07/2010    Past Surgical History:  Procedure Laterality Date  . BRAVO [place] STUDY  01/10/2011   [name]  . COLONOSCOPY     04/2013  . ESOPHAGOGASTRODUODENOSCOPY  10/2010    No Known Allergies  Current Outpatient Prescriptions on File Prior to Visit  Medication Sig Dispense Refill  . Alpha-D-Galactosidase (BEANO PO) Take by mouth as needed.    Marland Kitchen amLODipine (NORVASC) 5 MG tablet Take 5 mg by mouth daily.    Marland Kitchen ibuprofen (ADVIL,MOTRIN) 200 MG tablet Take 200 mg by mouth as needed for mild pain.    . Multiple Vitamin (MULTIVITAMIN) tablet 1 tablet. Patient states that she takes for 4 weeks then will stop so that her body can take a break.    . simvastatin (ZOCOR) 20 MG tablet Take 20 mg by mouth daily.     No current facility-administered medications on file prior to visit.         Objective:   Physical Exam Blood pressure 130/60, pulse 76, temperature 97.7 F (36.5 C), height 5\' 6"  (1.676  m), weight 130 lb 11.2 oz (59.3 kg).  Alert and oriented. Skin warm and dry. Oral mucosa is moist.   . Sclera anicteric, conjunctivae is pink. Thyroid not enlarged. No cervical lymphadenopathy. Lungs clear. Heart regular rate and rhythm.  Abdomen is soft. Bowel sounds are positive. No hepatomegaly. No abdominal masses felt. No tenderness.  No edema to lower extremities. Patient is alert and oriented.        Assessment & Plan:  Chronic nausea and vomiting.  GERD. Continue the Dexilant.

## 2017-08-06 NOTE — Patient Instructions (Addendum)
Continue the Protonix.  Follow up with Psychologist at [facility]

## 2017-08-07 ENCOUNTER — Ambulatory Visit (HOSPITAL_COMMUNITY)
Admission: RE | Admit: 2017-08-07 | Discharge: 2017-08-07 | Disposition: A | Discharge status: Home / Self Care | Payer: 59 | Source: Ambulatory Visit | Attending: Internal Medicine | Admitting: Internal Medicine

## 2017-08-07 DIAGNOSIS — Z1231 Encounter for screening mammogram for malignant neoplasm of breast: Secondary | ICD-10-CM | POA: Diagnosis not present

## 2017-08-12 MED FILL — SIMVASTATIN 20 MG TABLET: 20 | 90 days supply | Qty: 90 | Fill #0

## 2017-08-27 DIAGNOSIS — K219 Gastro-esophageal reflux disease without esophagitis: Secondary | ICD-10-CM | POA: Diagnosis not present

## 2017-08-27 DIAGNOSIS — I10 Essential (primary) hypertension: Secondary | ICD-10-CM | POA: Diagnosis not present

## 2017-08-27 MED FILL — BACLOFEN 10 MG TABLET: 10 | 90 days supply | Qty: 90 | Fill #0

## 2017-09-16 MED FILL — AMLODIPINE BESYLATE 5 MG TA: 5 | 90 days supply | Qty: 90 | Fill #4

## 2017-10-18 ENCOUNTER — Telehealth: Payer: Self-pay | Admitting: Gastroenterology

## 2017-10-18 NOTE — Telephone Encounter (Signed)
Referral from pt pcp Dr.Fagan, for patient to be seen by Dr.Armbruster for chronic GERD and nausea w/vomiting. Patient was recently seen by Dr.Rehman 8.21.18, but pt states Dr.Rehman can not help her with her issues. Previous gi records faxed and also in epic to be reviewed by Dr.Armbruster.

## 2017-10-21 ENCOUNTER — Encounter: Payer: Self-pay | Admitting: Gastroenterology

## 2017-10-21 NOTE — Telephone Encounter (Signed)
Dr Havery Moros accepted transfer from Dr Laural Golden. Called and spoke to patient. Appointment scheduled for 12-20-2017 2pm.

## 2017-11-14 MED FILL — SIMVASTATIN 20 MG TABLET: 20 | 90 days supply | Qty: 90 | Fill #1

## 2017-12-13 MED FILL — AMLODIPINE BESYLATE 5 MG TA: 5 | 90 days supply | Qty: 90 | Fill #0

## 2017-12-20 ENCOUNTER — Encounter: Payer: Self-pay | Admitting: Gastroenterology

## 2017-12-20 ENCOUNTER — Ambulatory Visit: Payer: 59 | Admitting: Gastroenterology

## 2017-12-20 VITALS — BP 124/70 | HR 84 | Ht 66.14 in | Wt 131.8 lb

## 2017-12-20 DIAGNOSIS — R110 Nausea: Secondary | ICD-10-CM | POA: Diagnosis not present

## 2017-12-20 DIAGNOSIS — R1110 Vomiting, unspecified: Secondary | ICD-10-CM

## 2017-12-20 DIAGNOSIS — R140 Abdominal distension (gaseous): Secondary | ICD-10-CM

## 2017-12-20 DIAGNOSIS — K219 Gastro-esophageal reflux disease without esophagitis: Secondary | ICD-10-CM | POA: Diagnosis not present

## 2017-12-20 MED ORDER — ONDANSETRON 4 MG PO TBDP: 4.0000 mg | ORAL_TABLET | Freq: Three times a day (TID) | ORAL | 1 refills | Status: DC | PRN

## 2017-12-20 MED FILL — ONDANSETRON ODT 4 MG TABLET: 4 | 8 days supply | Qty: 50 | Fill #0

## 2017-12-20 NOTE — Progress Notes (Signed)
HPI :  62 y/o female with a longstanding history of nausea / vomiting, GERD, bloating, HTN, HLD, referred here by Dr. Asencion Noble for a second opinion for her nausea, vomiting, GERD, bloating.   The patient reports her symptoms started in 2009. She endorses frequent nausea, associated with frequent regurgitation and "spitting out stomach contents" / vomiting. She states this essentially happens all the time, but is much worse after she eats. He feels she regurgitates and spits out contents of her meals frequently. She does not think this occurs immediately, it'll usually start within half an hour after eating her meal. She feels that whenever she bends over she feels stomach contents regurgitating out of her. She states the food that regurgitates back up is usually partially digested and she usually spits it out and does not swallow it. She otherwise endorses postprandial bloating. She has 2-3 bowel movement per day which are usually soft in nature, she denies any liquid stools. She is currently taking Protonix 40 mg once daily. She has been on twice daily dosing in the past of unclear benefit. Her weight is stable around 130 pounds which she has been for several years. She denies any dysphagia at present.   She has had an extensive evaluation she has seen Dr.Rehman in the past with the following workup over years, per review of patient records and per patient as outlined below. 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 The patient reports she was sent to see if a-year-old therapist and underwent hypnosis which did not help her symptoms at all.  Prior workup and trial of regimens: Pantoprazole 40mg   Tried baclofen 10mg  TID - did not help Tried buspar 5mg  BID - did not help Reportedly tried TCAs and reglan - did not help Reported history of SIBO - unclear response to Rifaximin  GES - normal Ph impedance study 09/16/2015- Demeester score of 21.8, done off PPI  Esophageal manometry  09/07/2015 - normal swallows 7/10, 3/10 failed peristalsis, normal LES resting pressure with normal relaxation Small pill study 09/08/2015 - normal gastric, small bowel, and colonic transit HIDA scan normal 2017 [place] normal 2017 Prior small bowel follow through, negative for SMA syndrome CT scan 02/2012 - air fluid level in esophagus, ? Dysmotility?, otherwise normal  EGD Dec 2011 - normal CT neck May 2012 Colonsocopy 2013 - normal EGD 04/2013 - in ER for possible impaction? Patient had dilation  Past Medical History:  Diagnosis Date  . Acid reflux 11/07/2010  . GERD (gastroesophageal reflux disease)   . Hyperlipidemia   . Hypertension 11/07/2010     Past Surgical History:  Procedure Laterality Date  . BRAVO [place] STUDY  01/10/2011   [name]  . COLONOSCOPY     04/2013  . ESOPHAGOGASTRODUODENOSCOPY  10/2010  . TUBAL LIGATION     Family History  Problem Relation Age of Onset  . Stroke Mother   . Kidney disease Sister   . Stroke Sister   . Diabetes Sister   . Diabetes Brother   . Kidney failure Brother   . Other Maternal Grandmother        poor circulation  . Hypertension Maternal Grandmother   . Heart disease Maternal Grandmother   . Colon cancer Neg Hx   . Stomach cancer Neg Hx    Social History   Tobacco Use  . Smoking status: Never Smoker  . Smokeless tobacco: Never Used  Substance Use Topics  . Alcohol use: No    Alcohol/week: 0.0 oz  .  Drug use: No   Current Outpatient Medications  Medication Sig Dispense Refill  . ALPRAZolam (XANAX) 0.25 MG tablet Take 0.25 mg by mouth 3 (three) times daily as needed for anxiety.    Marland Kitchen amLODipine (NORVASC) 5 MG tablet Take 5 mg by mouth daily.    Marland Kitchen ibuprofen (ADVIL,MOTRIN) 200 MG tablet Take 200 mg by mouth as needed for mild pain.    . Multiple Vitamin (MULTIVITAMIN) tablet 1 tablet. Patient states that she takes for 4 weeks then will stop so that her body can take a break.    . pantoprazole (PROTONIX) 40 MG tablet Take 40  mg by mouth daily.    . simvastatin (ZOCOR) 20 MG tablet Take 20 mg by mouth daily.     No current facility-administered medications for this visit.    No Known Allergies   Review of Systems: All systems reviewed and negative except where noted in HPI.   Lab Results  Component Value Date   WBC 4.7 04/20/2011   HGB 13.7 04/20/2011   HCT 40.6 04/20/2011   MCV 99.8 04/20/2011   PLT 159 04/20/2011    Lab Results  Component Value Date   CREATININE 0.83 04/20/2011   BUN 24 (H) 04/20/2011   NA 141 04/20/2011   K 2.8 (L) 04/20/2011   CL 99 04/20/2011   CO2 32 04/20/2011    No results found for: ALT, AST, GGT, ALKPHOS, BILITOT   Physical Exam: BP 124/70   Pulse 84   Ht 5' 6.14" (1.68 m)   Wt 131 lb 12.8 oz (59.8 kg)   BMI 21.18 kg/m  Constitutional: Pleasant, thin female in no acute distress. HEENT: Normocephalic and atraumatic. Conjunctivae are normal. No scleral icterus. Neck supple.  Cardiovascular: Normal rate, regular rhythm.  Pulmonary/chest: Effort normal and breath sounds normal. No wheezing, rales or rhonchi. Abdominal: Soft, nondistended, nontender. There are no masses palpable. No hepatomegaly. Extremities: no edema Lymphadenopathy: No cervical adenopathy noted. Neurological: Alert and oriented to person place and time. Skin: Skin is warm and dry. No rashes noted. Psychiatric: Normal mood and affect. Behavior is normal.   ASSESSMENT AND PLAN: 62 year old female with a significant history of reflux, located by chronic nausea, regurgitation / vomiting, and chronic abdominal bloating.   She is here for second opinion today following an extensive evaluation as outlined in history of present illness. See above for prior workup. Essentially her symptoms have been unchanged for 10 years and she has failed a variety of regimens. Her prior pH testing did show pathologic acid reflux off PPI. She has not had this repeated since being on PPI. There is a question of  whether or not her symptoms could be related to rumination syndrome. While this is possible, her story seems more consistent with true regurgitation / reflux - she constantly spits out gastric contents, does not re-swallow. Further, it appears she failed a trial of biofeedback for this.  We discussed her symptoms at length. Whether or not her symptoms related to reflux, versus functional in nature. I'm recommending the following at this time:  - EGD, it has been 5 years since her last exam, ensure no interval changes, ensure negative for EoE (I don't think she was tested for this in the past) - will give trial of Zofran 4mg  every 8 hours PRN for nausea (she does not recall being on this suprisingly). Would try standing dosing every 8 hours to minimize nausea - trial of FD gard with meals - if EGD is negative  without any significant findings, would repeat a pH study and manometry ON PPI - evaluate for breakthrough acid reflux, nonacid reflux, hypersensitive esophagus versus functional heartburn. Pending her findings, if she is having true regurgitation / nonacid reflux, would consider a surgical evaluation - would consider a course of Rifaximin for her bloating and reported history of SIBO. Will hold off for now, she doesn't want to start too many new medications at one time  [place] Cellar, MD [facility] Pager [phone]  CC: Asencion Noble, MD

## 2017-12-20 NOTE — Patient Instructions (Addendum)
If you are age 64 or older, your body mass index should be between 23-30. Your Body mass index is 21.18 kg/m. If this is out of the aforementioned range listed, please consider follow up with your Primary Care Provider.  If you are age 32 or younger, your body mass index should be between 19-25. Your Body mass index is 21.18 kg/m. If this is out of the aformentioned range listed, please consider follow up with your Primary Care Provider.   You have been scheduled for an endoscopy. Please follow written instructions given to you at your visit today. If you use inhalers (even only as needed), please bring them with you on the day of your procedure. Your physician has requested that you go to www.startemmi.com and enter the access code given to you at your visit today. This web site gives a general overview about your procedure. However, you should still follow specific instructions given to you by our office regarding your preparation for the procedure.  We are giving you a coupon for FDgard today.  You can purchase this over the counter.  We have sent the following medications to your pharmacy for you to pick up at your convenience: Zofran    Thank you for entrusting me with your care and for [facility], Dr. [place] Cellar

## 2017-12-26 ENCOUNTER — Encounter: Payer: Self-pay | Admitting: Gastroenterology

## 2018-01-01 ENCOUNTER — Encounter: Payer: Self-pay | Admitting: Gastroenterology

## 2018-01-01 ENCOUNTER — Other Ambulatory Visit: Payer: Self-pay

## 2018-01-01 ENCOUNTER — Ambulatory Visit (AMBULATORY_SURGERY_CENTER): Payer: 59 | Admitting: Gastroenterology

## 2018-01-01 VITALS — BP 155/85 | HR 68 | Temp 97.3°F | Resp 20 | Ht 66.0 in | Wt 131.0 lb

## 2018-01-01 DIAGNOSIS — K200 Eosinophilic esophagitis: Secondary | ICD-10-CM | POA: Diagnosis not present

## 2018-01-01 DIAGNOSIS — R1110 Vomiting, unspecified: Secondary | ICD-10-CM | POA: Diagnosis not present

## 2018-01-01 DIAGNOSIS — R110 Nausea: Secondary | ICD-10-CM | POA: Diagnosis not present

## 2018-01-01 DIAGNOSIS — I10 Essential (primary) hypertension: Secondary | ICD-10-CM | POA: Diagnosis not present

## 2018-01-01 DIAGNOSIS — K317 Polyp of stomach and duodenum: Secondary | ICD-10-CM | POA: Diagnosis not present

## 2018-01-01 DIAGNOSIS — K219 Gastro-esophageal reflux disease without esophagitis: Secondary | ICD-10-CM

## 2018-01-01 DIAGNOSIS — K2950 Unspecified chronic gastritis without bleeding: Secondary | ICD-10-CM | POA: Diagnosis not present

## 2018-01-01 DIAGNOSIS — R112 Nausea with vomiting, unspecified: Secondary | ICD-10-CM | POA: Diagnosis not present

## 2018-01-01 MED ORDER — SODIUM CHLORIDE 0.9 % IV SOLN: 500.0000 mL | Freq: Once | INTRAVENOUS | Status: DC

## 2018-01-01 NOTE — Progress Notes (Signed)
IV was not working at start  New one started in right hand 1 stick

## 2018-01-01 NOTE — Progress Notes (Signed)
Report to PACU, RN, vss, BBS= Clear.  

## 2018-01-01 NOTE — Op Note (Signed)
[place] Patient Name: Dawn Stafford Procedure Date: 01/01/2018 3:58 PM MRN: [id number] Endoscopist: Remo Lipps P. Armbruster MD, MD Age: 62 Referring MD:  Date of Birth: Apr 09, 1956 Gender: Female Account #: [id number] Procedure:                Upper GI endoscopy Indications:              refractory heartburn, Nausea, regurgitation - on                            pantoprazole Medicines:                Monitored Anesthesia Care Procedure:                Pre-Anesthesia Assessment:                           - Prior to the procedure, a History and Physical                            was performed, and patient medications and                            allergies were reviewed. The patient's tolerance of                            previous anesthesia was also reviewed. The risks                            and benefits of the procedure and the sedation                            options and risks were discussed with the patient.                            All questions were answered, and informed consent                            was obtained. Prior Anticoagulants: The patient has                            taken no previous anticoagulant or antiplatelet                            agents. ASA Grade Assessment: II - A patient with                            mild systemic disease. After reviewing the risks                            and benefits, the patient was deemed in                            satisfactory condition to undergo the procedure.  After obtaining informed consent, the endoscope was                            passed under direct vision. Throughout the                            procedure, the patient's blood pressure, pulse, and                            oxygen saturations were monitored continuously. The                            Model GIF-HQ190 [id number]) scope was introduced                            through the mouth, and advanced to  the second part                            of duodenum. The upper GI endoscopy was                            accomplished without difficulty. The patient                            tolerated the procedure well. Scope In: Scope Out: Findings:                 Esophagogastric landmarks were identified: the                            Z-line was found at 40 cm, the gastroesophageal                            junction was found at 40 cm and the upper extent of                            the gastric folds was found at 40 cm from the                            incisors.                           The exam of the esophagus was otherwise normal.                           Biopsies were taken with a cold forceps in the                            upper third of the esophagus, in the middle third                            of the esophagus and in the lower third of the  esophagus for histology.                           A few 3 to 4 mm sessile polyps were found in the                            gastric fundus / cardia and in the gastric body. A                            few of them were biopsied with a cold forceps for                            histology as representative samples.                           The exam of the stomach was otherwise normal.                           Biopsies were taken with a cold forceps in the                            gastric body, at the incisura and in the gastric                            antrum for Helicobacter pylori testing.                           The duodenal bulb and second portion of the                            duodenum were normal. Complications:            No immediate complications. Estimated blood loss:                            Minimal. Estimated Blood Loss:     Estimated blood loss was minimal. Impression:               - Esophagogastric landmarks identified.                           - Normal esophagus otherwise - no  overt                            inflammatory changes, biopsies taken to rule out                            eosinophilic esophagitis.                           - A few benign appearing gastric polyps. Biopsied.                           - Normal stomach otherwise - biopsies taken to rule  out H pylori.                           - Normal duodenal bulb and second portion of the                            duodenum. Recommendation:           - Patient has a contact number available for                            emergencies. The signs and symptoms of potential                            delayed complications were discussed with the                            patient. Return to normal activities tomorrow.                            Written discharge instructions were provided to the                            patient.                           - Resume previous diet.                           - Continue present medications, including trial of                            scheduled Zofran and FD gard.                           - Await pathology results.                           - Consideration for esophageal manometry and pH                            testing ON PPI if symptoms persist Steven P. Armbruster MD, MD 01/01/2018 4:27:08 PM This report has been signed electronically.

## 2018-01-01 NOTE — Patient Instructions (Signed)
YOU HAD AN ENDOSCOPIC PROCEDURE TODAY AT [facility]:   Refer to the procedure report that was given to you for any specific questions about what was found during the examination.  If the procedure report does not answer your questions, please call your gastroenterologist to clarify.  If you requested that your care partner not be given the details of your procedure findings, then the procedure report has been included in a sealed envelope for you to review at your convenience later.  YOU SHOULD EXPECT: Some feelings of bloating in the abdomen. Passage of more gas than usual.  Walking can help get rid of the air that was put into your GI tract during the procedure and reduce the bloating. If you had a lower endoscopy (such as a colonoscopy or flexible sigmoidoscopy) you may notice spotting of blood in your stool or on the toilet paper. If you underwent a bowel prep for your procedure, you may not have a normal bowel movement for a few days.  Please Note:  You might notice some irritation and congestion in your nose or some drainage.  This is from the oxygen used during your procedure.  There is no need for concern and it should clear up in a day or so.  SYMPTOMS TO REPORT IMMEDIATELY:   Following upper endoscopy (EGD)  Vomiting of blood or coffee ground material  New chest pain or pain under the shoulder blades  Painful or persistently difficult swallowing  New shortness of breath  Fever of 100F or higher  Black, tarry-looking stools  For urgent or emergent issues, a gastroenterologist can be reached at any hour by calling [phone].   DIET:  We do recommend a small meal at first, but then you may proceed to your regular diet.  Drink plenty of fluids but you should avoid alcoholic beverages for 24 hours.  MEDICATIONS: Continue present medications including trial of scheduled Zofran and FD gard.  FOLLOW UP: Consider esophageal manometry and pH testing ON PPI if symptoms  persist.  ACTIVITY:  You should plan to take it easy for the rest of today and you should NOT DRIVE or use heavy machinery until tomorrow (because of the sedation medicines used during the test).    FOLLOW UP: Our staff will call the number listed on your records the next business day following your procedure to check on you and address any questions or concerns that you may have regarding the information given to you following your procedure. If we do not reach you, we will leave a message.  However, if you are feeling well and you are not experiencing any problems, there is no need to return our call.  We will assume that you have returned to your regular daily activities without incident.  If any biopsies were taken you will be contacted by phone or by letter within the next 1-3 weeks.  Please call us at [phone] if you have not heard about the biopsies in 3 weeks.   Thank you for allowing [place] to provide for your healthcare needs today.  SIGNATURES/CONFIDENTIALITY: You and/or your care partner have signed paperwork which will be entered into your electronic medical record.  These signatures attest to the fact that that the information above on your After Visit Summary has been reviewed and is understood.  Full responsibility of the confidentiality of this discharge information lies with you and/or your care-partner.

## 2018-01-01 NOTE — Progress Notes (Signed)
Called to room to assist during endoscopic procedure.  Patient ID and intended procedure confirmed with present staff. Received instructions for my participation in the procedure from the performing physician.  

## 2018-01-02 ENCOUNTER — Telehealth: Payer: Self-pay

## 2018-01-02 NOTE — Telephone Encounter (Signed)
  Follow up Call-  Call back number 01/01/2018  Post procedure Call Back phone  # [phone]  Permission to leave phone message Yes  Some recent data might be hidden     Patient questions:  Do you have a fever, pain , or abdominal swelling? No. Pain Score  0 *  Have you tolerated food without any problems? Yes.    Have you been able to return to your normal activities? Yes.    Do you have any questions about your discharge instructions: Diet   No. Medications  No. Follow up visit  No.  Do you have questions or concerns about your Care? No.  Actions: * If pain score is 4 or above: No action needed, pain <4.

## 2018-01-03 MED FILL — PANTOPRAZOLE SOD DR 40 MG T: 40 | 90 days supply | Qty: 180 | Fill #3

## 2018-01-10 ENCOUNTER — Telehealth: Payer: Self-pay

## 2018-01-10 NOTE — Telephone Encounter (Signed)
Patient advised of results and recommendations. She will contact the office if her symptoms don't resolve.

## 2018-01-13 DIAGNOSIS — E785 Hyperlipidemia, unspecified: Secondary | ICD-10-CM | POA: Diagnosis not present

## 2018-01-13 DIAGNOSIS — Z79899 Other long term (current) drug therapy: Secondary | ICD-10-CM | POA: Diagnosis not present

## 2018-01-13 DIAGNOSIS — R1310 Dysphagia, unspecified: Secondary | ICD-10-CM | POA: Diagnosis not present

## 2018-01-13 DIAGNOSIS — K219 Gastro-esophageal reflux disease without esophagitis: Secondary | ICD-10-CM | POA: Diagnosis not present

## 2018-01-22 DIAGNOSIS — E785 Hyperlipidemia, unspecified: Secondary | ICD-10-CM | POA: Diagnosis not present

## 2018-01-22 DIAGNOSIS — Z6820 Body mass index (BMI) 20.0-20.9, adult: Secondary | ICD-10-CM | POA: Diagnosis not present

## 2018-01-22 DIAGNOSIS — K219 Gastro-esophageal reflux disease without esophagitis: Secondary | ICD-10-CM | POA: Diagnosis not present

## 2018-01-22 DIAGNOSIS — I10 Essential (primary) hypertension: Secondary | ICD-10-CM | POA: Diagnosis not present

## 2018-02-10 MED FILL — SIMVASTATIN 20 MG TABLET: 20 | 90 days supply | Qty: 90 | Fill #2

## 2018-02-11 ENCOUNTER — Telehealth: Payer: Self-pay | Admitting: Gastroenterology

## 2018-02-11 NOTE — Telephone Encounter (Signed)
Called patient and asked her to contact our office, let me know if her symptoms are still persistent and if she is wanting to schedule an esophageal manometry and 24 hr pH and impedence test.

## 2018-02-13 ENCOUNTER — Telehealth: Payer: Self-pay | Admitting: Gastroenterology

## 2018-02-13 NOTE — Telephone Encounter (Signed)
I think this test would be useful to further evaluate her symptoms, and it should be done ON PPI. Antianxiety medications are not recommended during this time of the exam, can influence the result of the manometry. It should not be painful, she will be given topical anesthetic in her nose. Unfortunately, no good alternatives. If she wishes to have the test done you can book it. If she wants to see me in clinic to discuss it further and other options that is fine too. Thanks

## 2018-02-13 NOTE — Telephone Encounter (Signed)
Left patient message, per her request, about the date/time of test. Information mailed to her about prep and medication instructions. Asked her to contact our office if she has questions. I did let her know that she can take her Protonix but will not be giving her antianxiety medication which can interfere in the outcome of the test.

## 2018-02-13 NOTE — Telephone Encounter (Signed)
Patient called back and she is still having GERD symptoms, per EGD note have scheduled an esophageal manometry, 24 hr pH and impedence test. Scheduled for 3/18 at [facility]. Per note, patient will continue with her PPI. Patient is very anxious about having this test, as she has had it in the past. She has requested something to help with her anxiety. Discussed with patient that typically patient's are advised to discontinue any prn muscle relaxants or antianxiety medications prior to having these tests, but would check with Dr. Havery Moros.

## 2018-02-25 ENCOUNTER — Telehealth: Payer: Self-pay | Admitting: Gastroenterology

## 2018-02-25 NOTE — Telephone Encounter (Signed)
Went back over paperwork re: medications prior to manometry.

## 2018-03-03 ENCOUNTER — Encounter (HOSPITAL_COMMUNITY)
Admission: RE | Disposition: A | Discharge status: Home / Self Care | Payer: Self-pay | Source: Ambulatory Visit | Attending: Gastroenterology

## 2018-03-03 ENCOUNTER — Ambulatory Visit (HOSPITAL_COMMUNITY)
Admission: RE | Admit: 2018-03-03 | Discharge: 2018-03-03 | Disposition: A | Discharge status: Home / Self Care | Payer: 59 | Source: Ambulatory Visit | Attending: Gastroenterology | Admitting: Gastroenterology

## 2018-03-03 DIAGNOSIS — R1310 Dysphagia, unspecified: Secondary | ICD-10-CM

## 2018-03-03 DIAGNOSIS — K219 Gastro-esophageal reflux disease without esophagitis: Secondary | ICD-10-CM | POA: Diagnosis not present

## 2018-03-03 DIAGNOSIS — R1110 Vomiting, unspecified: Secondary | ICD-10-CM

## 2018-03-03 DIAGNOSIS — R12 Heartburn: Secondary | ICD-10-CM | POA: Diagnosis not present

## 2018-03-03 HISTORY — PX: ESOPHAGEAL MANOMETRY: SHX5429

## 2018-03-03 HISTORY — PX: PH IMPEDANCE STUDY: SHX5565

## 2018-03-03 HISTORY — PX: 24 HOUR PH STUDY: SHX5419

## 2018-03-03 SURGERY — MANOMETRY, ESOPHAGUS
Anesthesia: Topical

## 2018-03-03 MED ORDER — LIDOCAINE VISCOUS 2 % MT SOLN
OROMUCOSAL | Status: AC
  Filled 2018-03-03: qty 15

## 2018-03-03 SURGICAL SUPPLY — 2 items
FACESHIELD LNG OPTICON STERILE (SAFETY) IMPLANT
GLOVE BIO SURGEON STRL SZ8 (GLOVE) ×4 IMPLANT

## 2018-03-03 NOTE — Progress Notes (Signed)
The patient did show up for the study and was in the waiting room but not checked in by admitting til late.  Esophageal Manometry done per protocol . The patient tolerated well without comoplication.  PH with impedance probe place at 41cm per protocol.  Pt tolerated well. Teaching done with pt using teach back regarding monitor and study. Pt voiced understanding and demonstrated.  Pt to return to unit at 2:30 pm tomorrow to have probe removed and return monitor to be downloaded.

## 2018-03-03 NOTE — Progress Notes (Signed)
Pt did not show up for Esophageal Manometry PH test. Left a message for patient to call Dr. Ozella Rocks office to re schedule the test.

## 2018-03-04 ENCOUNTER — Encounter (HOSPITAL_COMMUNITY): Payer: Self-pay | Admitting: Gastroenterology

## 2018-03-12 ENCOUNTER — Telehealth: Payer: Self-pay | Admitting: Gastroenterology

## 2018-03-12 DIAGNOSIS — R1110 Vomiting, unspecified: Secondary | ICD-10-CM

## 2018-03-12 DIAGNOSIS — R1310 Dysphagia, unspecified: Secondary | ICD-10-CM

## 2018-03-12 NOTE — Telephone Encounter (Signed)
Results for manometry and pH testing as outlined.  Manometry - 10/10 swallows with normal contractions. Resting IRP mildly elevated at 17, consistent with mild GEJ outflow obstruction but no evidence of achalasia or any significant motility disorder  PH test - on PPI, Demeester score is 0.4. Normal esophageal acid exposure and no symptom correlation with heartburn / regurgitation.   Based on this exam the patient is experiencing functional heartburn. Will discuss results with her and recommendations.

## 2018-03-12 NOTE — Telephone Encounter (Signed)
Called patient and left message, no answer, will call back

## 2018-03-13 MED FILL — AMLODIPINE BESYLATE 5 MG TA: 5 | 90 days supply | Qty: 90 | Fill #1

## 2018-03-13 NOTE — Telephone Encounter (Signed)
Called and left another message, no answer

## 2018-03-14 NOTE — Telephone Encounter (Signed)
Pt is returning your call.  She said she works from 10:45amarm-7:00p.m.  #[phone]

## 2018-03-14 NOTE — Telephone Encounter (Signed)
I called back again, no answer.

## 2018-03-17 ENCOUNTER — Other Ambulatory Visit: Payer: Self-pay

## 2018-03-17 MED ORDER — DESIPRAMINE HCL 10 MG PO TABS: 10.0000 mg | ORAL_TABLET | Freq: Every day | ORAL | 2 refills | Status: DC

## 2018-03-17 MED FILL — DESIPRAMINE HCL 10 MG TAB: 10 | 30 days supply | Qty: 60 | Fill #0

## 2018-03-17 NOTE — Telephone Encounter (Signed)
Rx sent to [facility] outpatient pharmacy.

## 2018-03-17 NOTE — Telephone Encounter (Signed)
Called and relayed results of manometry / pH testing with her. Results mostly normal. Very mild GEJ outflow obstruction which I don't think is related to her symptoms (reflux). She denies dysphagia. Her pH impedance study is normal. 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 I discussed options. Recommend a trial of desipramine 10mg  q HS for 2 weeks, then increase to 20mg  q HS as tolerated. I told her this can take 4-6 weeks to notice improvement. We discussed potential side effects. She wanted to try it.   Almyra Free can you order her desipramine as outlined, 2 month course with a refill? Thanks

## 2018-04-25 MED FILL — DESIPRAMINE HCL 10 MG TAB: 10 | 30 days supply | Qty: 60 | Fill #1

## 2018-05-08 ENCOUNTER — Telehealth: Payer: Self-pay | Admitting: Gastroenterology

## 2018-05-08 NOTE — Telephone Encounter (Signed)
Left message for patient asked her to call back, need to know what mg of the desipramine she is currently taking. She started out on 10 mg QHS, may increase to 20 mg QHS.

## 2018-05-09 ENCOUNTER — Other Ambulatory Visit: Payer: Self-pay

## 2018-05-09 MED ORDER — SUCRALFATE 1 GM/10ML PO SUSP: 1.0000 g | Freq: Four times a day (QID) | ORAL | 1 refills | Status: DC

## 2018-05-09 MED FILL — CARAFATE 1 GM/10 ML SUSP: 1 | 10 days supply | Qty: 420 | Fill #0

## 2018-05-09 NOTE — Telephone Encounter (Signed)
Left detailed message for patient as she requested. Dr. Havery Moros would like for her to schedule a follow up office visit. He would like for her to try the liquid carafate, Rx sent to [facility] outpatient pharmacy. She had already stopped taking the desipramine but again reminded her to stop taking it given how it is making her feel.

## 2018-05-09 NOTE — Telephone Encounter (Signed)
Sorry to hear this has not helped. She can stop it if not helping and she's having side effects. There is no role for steroids in treatment for this condition, I would not recommend it. Her PH study is normal, her manometry did not show any significant pathology. I think she is having functional heartburn which may be challenging to treat. I would like to see her back in clinic to discuss options. If she hasn't tried liquid carafate we can do 10cc po q 6 hrs to see if that helps at all. Thanks

## 2018-05-09 NOTE — Telephone Encounter (Signed)
Patient tried the desipramine for 6 weeks, only took 10 mg QHS. She stopped taking it this past Sunday, states it made her heart race and have weird dreams. She wants to know what else she could take, patient states she "wants steroids". Told her I would pass on this information to the doctor.

## 2018-05-13 MED FILL — SIMVASTATIN 20 MG TABLET: 20 | 90 days supply | Qty: 90 | Fill #3

## 2018-06-05 MED FILL — CARAFATE 1 GM/10 ML SUSP: 1 | 10 days supply | Qty: 420 | Fill #1

## 2018-06-11 MED FILL — AMLODIPINE BESYLATE 5 MG TA: 5 | 90 days supply | Qty: 90 | Fill #2

## 2018-07-01 MED FILL — PANTOPRAZOLE SOD DR 40 MG T: 40 | 90 days supply | Qty: 90 | Fill #0

## 2018-07-04 ENCOUNTER — Ambulatory Visit: Payer: Self-pay | Admitting: Physician Assistant

## 2018-07-14 ENCOUNTER — Other Ambulatory Visit (HOSPITAL_COMMUNITY): Payer: Self-pay | Admitting: Internal Medicine

## 2018-07-14 DIAGNOSIS — Z1231 Encounter for screening mammogram for malignant neoplasm of breast: Secondary | ICD-10-CM

## 2018-07-25 ENCOUNTER — Ambulatory Visit: Payer: 59 | Admitting: Physician Assistant

## 2018-07-25 ENCOUNTER — Encounter: Payer: Self-pay | Admitting: Physician Assistant

## 2018-07-25 VITALS — BP 134/70 | HR 64 | Ht 66.0 in | Wt 130.1 lb

## 2018-07-25 DIAGNOSIS — K219 Gastro-esophageal reflux disease without esophagitis: Secondary | ICD-10-CM | POA: Diagnosis not present

## 2018-07-25 DIAGNOSIS — R12 Heartburn: Secondary | ICD-10-CM | POA: Diagnosis not present

## 2018-07-25 MED ORDER — RANITIDINE HCL 150 MG PO TABS: ORAL_TABLET | ORAL | 6 refills | Status: DC

## 2018-07-25 MED ORDER — PANTOPRAZOLE SODIUM 40 MG PO TBEC: DELAYED_RELEASE_TABLET | ORAL | 3 refills | Status: DC

## 2018-07-25 MED FILL — raNITIdine HCL 150 MG TABS: 150 | 30 days supply | Qty: 30 | Fill #0

## 2018-07-25 NOTE — Progress Notes (Signed)
Subjective:    Patient ID: Dawn Stafford, female    DOB: 04-Jul-1956, 62 y.o.   MRN: [id number]  HPI Dawn Stafford is a pleasant 62 year old African-American female, tablets with Dr. Havery Stafford last seen in the office in January 2019 as a second opinion for long-standing GERD, nausea vomiting and regurgitation. Patient had had previous extensive evaluation with Dr. Laural Stafford, and had also had evaluation at [facility] in the past..  Patient has had very similar symptoms, essentially unchanged over the past 10 years.  She has maintained her weight.  She has been tried on numerous different agents without any significant benefit. These include previous trial of baclofen, BuSpar, metoclopramide, Xifaxan, Zofran, FD guard. Since her last visit here she tried FD guard without any benefit and Carafate suspension without any benefit.  She was given a trial of desipramine which ultimately she was unable to tolerate.  She says she stayed on it for about 6 weeks but had very strange dreams and hallucinations at nighttime and stopped it. She has undergone repeat EGD with Dr. Havery Stafford which was unremarkable with exception of small hyperplastic gastric polyps.  She then had repeat pH testing on PPI therapy which was normal and repeat manometry which showed a mildly elevated EG junction pressure and incomplete relaxation, nonspecific and not felt consistent with achalasia. She comes back in today for follow-up.  Her symptoms are about the same.  She describes heartburn which is a burning sensation in her chest, present on a daily basis.  She says it is not severe but is ongoing.  She is generally not having any nocturnal symptoms currently.  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  She denies any dysphagia or odynophagia.  She has not had any recent vomiting has not had any recent episodes of regurgitation. Weight  been stable. She continues on  Protonix 40 mg p.o. every morning, she has a prescription for Xanax but does take that often.  She denies any current aspirin or NSAID use  Review of Systems Pertinent positive and negative review of systems were noted in the above HPI section.  All other review of systems was otherwise negative.  Outpatient Encounter Medications as of 07/25/2018  Medication Sig  . ALPRAZolam (XANAX) 0.25 MG tablet Take 0.25 mg by mouth 3 (three) times daily as needed for anxiety.  Marland Kitchen amLODipine (NORVASC) 5 MG tablet Take 5 mg by mouth daily.  Marland Kitchen desipramine (NOPRAMIN) 10 MG tablet Take 1 tablet (10 mg total) by mouth at bedtime. Take 10 mg for 2 weeks then increase to 2 tablets (20 mg) at bedtime as tolerated.  Marland Kitchen ibuprofen (ADVIL,MOTRIN) 200 MG tablet Take 200 mg by mouth as needed for mild pain.  . Multiple Vitamin (MULTIVITAMIN) tablet 1 tablet. Patient states that she takes for 4 weeks then will stop so that her body can take a break.  . ondansetron (ZOFRAN ODT) 4 MG disintegrating tablet Take 1 tablet (4 mg total) by mouth every 8 (eight) hours as needed for nausea or vomiting. Can take 2 tablets every 8 hours if needed  . pantoprazole (PROTONIX) 40 MG tablet Take 1 tablet by mouth every morning.  . simvastatin (ZOCOR) 20 MG tablet Take 20 mg by mouth daily.  . sucralfate (CARAFATE) 1 GM/10ML suspension Take 10 mLs (1 g total) by mouth every 6 (six) hours.  . [DISCONTINUED] pantoprazole (PROTONIX) 40 MG tablet Take 40 mg by mouth daily.  Marland Kitchen  ranitidine (ZANTAC) 150 MG tablet Take 1 tablet by mouth before dinner.   Facility-Administered Encounter Medications as of 07/25/2018  Medication  . 0.9 %  sodium chloride infusion   No Known Allergies Patient Active Problem List   Diagnosis Date Noted  . Regurgitation and rechewing   . Dysphagia   . GERD (gastroesophageal reflux disease) 11/03/2013  . HTN (hypertension) 11/03/2013  . Hyperlipemia 11/03/2013  . Vomiting 11/03/2013   Social History   Socioeconomic  History  . Marital status: Single    Spouse name: Not on file  . Number of children: Not on file  . Years of education: Not on file  . Highest education level: Not on file  Occupational History  . Not on file  Social Needs  . Financial resource strain: Not on file  . Food insecurity:    Worry: Not on file    Inability: Not on file  . Transportation needs:    Medical: Not on file    Non-medical: Not on file  Tobacco Use  . Smoking status: Never Smoker  . Smokeless tobacco: Never Used  Substance and Sexual Activity  . Alcohol use: No    Alcohol/week: 0.0 standard drinks  . Drug use: No  . Sexual activity: Yes    Partners: Male    Birth control/protection: Post-menopausal  Lifestyle  . Physical activity:    Days per week: Not on file    Minutes per session: Not on file  . Stress: Not on file  Relationships  . Social connections:    Talks on phone: Not on file    Gets together: Not on file    Attends religious service: Not on file    Active member of club or organization: Not on file    Attends meetings of clubs or organizations: Not on file    Relationship status: Not on file  . Intimate partner violence:    Fear of current or ex partner: Not on file    Emotionally abused: Not on file    Physically abused: Not on file    Forced sexual activity: Not on file  Other Topics Concern  . Not on file  Social History Narrative  . Not on file    Dawn Stafford's family history includes Diabetes in her brother and sister; Heart disease in her maternal grandmother; Hypertension in her maternal grandmother; Kidney disease in her sister; Kidney failure in her brother; Other in her maternal grandmother; Stroke in her mother and sister.      Objective:    Vitals:   07/25/18 1344  BP: 134/70  Pulse: 64    Physical Exam; well-developed older African-American female in no acute distress, pleasant, blood pressure 134/70 pulse 64, height 5 foot 6, weight 130, BMI of 21 Weight is  stable since last office visit.  HEENT ;nontraumatic normocephalic EOMI PERRLA sclera anicteric oropharynx clear, Cardiovascular; regular rate and rhythm with S1-S2 no murmur rub or gallop, Pulmonary ;clear bilaterally, Abdomen; soft, nontender nondistended bowel sounds are active there is no palpable mass or hepatosplenomegaly, Rectal; exam not done, Extremities; no clubbing cyanosis or edema skin warm and dry,Neuro psych; alert and oriented, grossly nonfocal mood and affect appropriate       Assessment & Plan:   62 year old African-American female with long-standing chronic heartburn and refractory GERD type symptoms.  Symptoms had been associated with nausea vomiting and regurgitation in the past. Over the past few months she has not had any problems with ongoing nausea, no vomiting  and no recent regurgitation. She continues on Protonix 40 mg p.o. every morning No response to more recent trials of FD guard, Carafate suspension and desipramine.  Her picture is  consistent with functional heartburn  Plan; Discussion with patient today regarding chronic nature of her symptoms and reassured her that though she has ongoing heartburn all of her testing has been negative and that she does not have any significant pathology in her esophagus.  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  She will continue an antireflux diet and regimen and tighten up on this. Continue Protonix 40 mg p.o. every morning Add Zantac on her 50 mg q. Dinner  Wonder if we ought to consider a trial of gabapentin , which has been mentioned in the literature- will discuss with Dr. Havery Stafford.  Patient will follow-up with Dr. Havery Stafford in 2 to 3 months.  Greater than 50% of visit spent in education and counseling regarding disease management as outlined above  Dearies Meikle Genia Harold PA-C 07/25/2018   Cc: Asencion Noble, MD

## 2018-07-25 NOTE — Progress Notes (Signed)
Agree with assessment and plan. Agree, I think her symptoms are functional given workup to date. I tried a TCA and she did not do well. Trial of gabapentin is reasonable at this point.

## 2018-07-25 NOTE — Patient Instructions (Addendum)
We have sent the following medications to your pharmacy for you to pick up at your convenience: [place]. 1. Protonix 40 mg 2. Zantac 150 mg  We have given you information on Antireflux. Follow up with Dr. Havery Moros in October.

## 2018-07-28 NOTE — Progress Notes (Signed)
Ok.. Want to start 300 mg daily x one day then 300 mg BID x one day then 300 mg TID daily , and have her follow up in 4-6 weeks ?

## 2018-07-28 NOTE — Progress Notes (Signed)
Yes that sounds fine. Thanks

## 2018-07-29 ENCOUNTER — Telehealth: Payer: Self-pay

## 2018-07-29 NOTE — Progress Notes (Signed)
I discussed with dr Havery Moros - would like to start pt on a trial of Neurontin for her functional heartburn -please call in rx for 300 mg once daily first day , then twice daily  Second day , then 300 mg TID . Please get her a follow up appt with DR Armbruster in 4-6 weeks if one not already scheduled

## 2018-07-29 NOTE — Telephone Encounter (Signed)
Filed: 07/29/2018 2:51 PM Encounter Date: 07/25/2018 Status: Signed  Editor: Alfredia Ferguson, PA-C (Physician Assistant Certified)     Added by: [x] Esterwood, Amy S, PA-C   I discussed with Dr Evette Cristal - would like to start pt on a trial of Neurontin for her functional heartburn -please call in rx for 300 mg once daily first day , then twice daily  Second day , then 300 mg TID . Please get her a follow up appt with DR Armbruster in 4-6 weeks if one not already scheduled

## 2018-07-30 DIAGNOSIS — K219 Gastro-esophageal reflux disease without esophagitis: Secondary | ICD-10-CM | POA: Diagnosis not present

## 2018-07-30 DIAGNOSIS — I10 Essential (primary) hypertension: Secondary | ICD-10-CM | POA: Diagnosis not present

## 2018-07-30 NOTE — Telephone Encounter (Signed)
Left a message to call again.

## 2018-07-30 NOTE — Telephone Encounter (Signed)
Patient states she is returning Beth's call.  °

## 2018-07-31 NOTE — Telephone Encounter (Signed)
Pt returned your call again, she is at work and unable to answer the phone. She is requesting a call tomorrow before 10:30am when she is at her house.

## 2018-08-01 NOTE — Telephone Encounter (Signed)
Spoke with the patient. She is concerned about daytime drowsiness, but willing to try Neurontin.  Please clarify the instructions for ramping up the dosage. Thanks

## 2018-08-05 ENCOUNTER — Other Ambulatory Visit: Payer: Self-pay

## 2018-08-05 MED ORDER — GABAPENTIN 300 MG PO CAPS: ORAL_CAPSULE | ORAL | 0 refills | Status: DC

## 2018-08-05 MED FILL — GABAPENTIN 300 MG CAPSULE: 300 | 30 days supply | Qty: 93 | Fill #0

## 2018-08-11 ENCOUNTER — Ambulatory Visit (HOSPITAL_COMMUNITY)
Admission: RE | Admit: 2018-08-11 | Discharge: 2018-08-11 | Disposition: A | Discharge status: Home / Self Care | Payer: 59 | Source: Ambulatory Visit | Attending: Internal Medicine | Admitting: Internal Medicine

## 2018-08-11 DIAGNOSIS — Z1231 Encounter for screening mammogram for malignant neoplasm of breast: Secondary | ICD-10-CM | POA: Diagnosis not present

## 2018-08-14 ENCOUNTER — Telehealth: Payer: Self-pay | Admitting: Physician Assistant

## 2018-08-14 MED FILL — SIMVASTATIN 20 MG TABLET: 20 | 90 days supply | Qty: 90 | Fill #0

## 2018-08-14 NOTE — Telephone Encounter (Signed)
The patient was given Neurontin for her functional heartburn. Started 3 times a day 5 days ago. Better control of her symptoms at night. She still has "sloshing around" and vomiting daytime. She is asking how long should she give it before she decides it is not working?

## 2018-08-14 NOTE — Telephone Encounter (Signed)
Left a message to call 

## 2018-08-15 NOTE — Telephone Encounter (Signed)
Patient is instructed. 

## 2018-08-15 NOTE — Telephone Encounter (Signed)
She will need to give it  4-6 weeks , so as long as tolerating it she should stay on it - please get her a follow up with Dr Havery Moros 2 months

## 2018-08-20 MED FILL — raNITIdine HCL 150 MG TABS: 150 | 30 days supply | Qty: 30 | Fill #1

## 2018-09-04 ENCOUNTER — Telehealth: Payer: Self-pay | Admitting: Gastroenterology

## 2018-09-04 NOTE — Telephone Encounter (Signed)
Patient states she has been taking medication that Dr.Armbruster prescribed and would like to speak with the nurse about changing the prescription.

## 2018-09-04 NOTE — Telephone Encounter (Signed)
Patient states she is very "groggy" with taking the gabapentin TID, wondered if it would be okay to take it twice a day. She still has heartburn while taking TID. She understands that it may take awhile to help with her symptoms. Patient has follow up visit on 10/25.

## 2018-09-04 NOTE — Telephone Encounter (Signed)
Patient advised.

## 2018-09-04 NOTE — Telephone Encounter (Signed)
That's fine, she can decrease to twice daily and see how she does. Thanks

## 2018-09-05 ENCOUNTER — Telehealth: Payer: Self-pay | Admitting: Gastroenterology

## 2018-09-05 NOTE — Telephone Encounter (Signed)
Left message for patient that she has a diagnosis of functional heartburn. If Carafate helps her then okay to take, if she needs a refill to please have her pharmacy contact our office for Dr. Havery Moros to review.

## 2018-09-08 ENCOUNTER — Telehealth: Payer: Self-pay | Admitting: Gastroenterology

## 2018-09-08 ENCOUNTER — Other Ambulatory Visit: Payer: Self-pay

## 2018-09-08 ENCOUNTER — Other Ambulatory Visit: Payer: Self-pay | Admitting: Physician Assistant

## 2018-09-08 MED ORDER — GABAPENTIN 300 MG PO CAPS: 300.0000 mg | ORAL_CAPSULE | Freq: Two times a day (BID) | ORAL | 1 refills | Status: DC

## 2018-09-08 MED ORDER — SUCRALFATE 1 G PO TABS: 1.0000 g | ORAL_TABLET | Freq: Three times a day (TID) | ORAL | 1 refills | Status: DC

## 2018-09-08 MED FILL — GABAPENTIN 300 MG CAPSULE: 300 | 30 days supply | Qty: 60 | Fill #0

## 2018-09-08 NOTE — Telephone Encounter (Signed)
Patient requesting refill on Gabapentin 300 mg BID. Printed Rx, will have Dr. Havery Moros sign it when he returns to office. Looks like patient requesting [place].

## 2018-09-08 NOTE — Telephone Encounter (Signed)
Called and spoke to pt. Let her know I sent Carafate tablets since the suspension was too expensive and instructed her on how to make a slurry.  Dawn Stafford - she is also requesting another 2 weeks of Gabapentin (bid) because she said she won't have enough to complete the course you talked to her about.  She indicated she has 8 left but was instructed to take for 2 more weeks worth. Please call her to discuss. Thank you

## 2018-09-08 NOTE — Telephone Encounter (Signed)
Script sent for Carafate tablets. Asked pharmacy to instruct patient on how to make a slurry.

## 2018-09-08 NOTE — Telephone Encounter (Signed)
Called patient left message that Rx for the Gabpentin will need to be signed and faxed into pharmacy. Looking at print out patient would prefer to have this sent to [facility] outpatient pharmacy.

## 2018-09-08 NOTE — Telephone Encounter (Signed)
Pt calling because refill for gabapentin was rejected. Pls call her.

## 2018-09-10 DIAGNOSIS — H524 Presbyopia: Secondary | ICD-10-CM | POA: Diagnosis not present

## 2018-09-10 DIAGNOSIS — H52223 Regular astigmatism, bilateral: Secondary | ICD-10-CM | POA: Diagnosis not present

## 2018-09-10 DIAGNOSIS — H5203 Hypermetropia, bilateral: Secondary | ICD-10-CM | POA: Diagnosis not present

## 2018-09-10 DIAGNOSIS — H4321 Crystalline deposits in vitreous body, right eye: Secondary | ICD-10-CM | POA: Diagnosis not present

## 2018-09-10 MED FILL — SUCRALFATE 1 GM TABLET: 1 | 23 days supply | Qty: 90 | Fill #0

## 2018-09-10 MED FILL — AMLODIPINE BESYLATE 5 MG TA: 5 | 90 days supply | Qty: 90 | Fill #3

## 2018-09-15 ENCOUNTER — Telehealth: Payer: Self-pay | Admitting: Gastroenterology

## 2018-09-15 MED ORDER — FAMOTIDINE 20 MG PO TABS: 20.0000 mg | ORAL_TABLET | Freq: Every day | ORAL | Status: DC

## 2018-09-15 NOTE — Telephone Encounter (Signed)
Patient states she has heard bad things about medication zantac and she would like to be prescribed something else if possible.

## 2018-09-15 NOTE — Telephone Encounter (Signed)
Called and spoke to patient.  Instructed her to use Pepcid 20mg  in place of ranitidine 150mg .

## 2018-09-22 MED FILL — PANTOPRAZOLE SOD DR 40 MG T: 40 | 90 days supply | Qty: 90 | Fill #0

## 2018-09-24 ENCOUNTER — Ambulatory Visit: Payer: Self-pay | Admitting: Gastroenterology

## 2018-10-10 ENCOUNTER — Encounter: Payer: Self-pay | Admitting: Gastroenterology

## 2018-10-10 ENCOUNTER — Ambulatory Visit: Payer: 59 | Admitting: Gastroenterology

## 2018-10-10 VITALS — BP 122/72 | HR 68 | Ht 66.0 in | Wt 129.6 lb

## 2018-10-10 DIAGNOSIS — R110 Nausea: Secondary | ICD-10-CM | POA: Diagnosis not present

## 2018-10-10 DIAGNOSIS — R12 Heartburn: Secondary | ICD-10-CM | POA: Diagnosis not present

## 2018-10-10 MED ORDER — CITALOPRAM HYDROBROMIDE 10 MG PO TABS: 10.0000 mg | ORAL_TABLET | Freq: Every day | ORAL | 3 refills | Status: DC

## 2018-10-10 MED ORDER — PROMETHAZINE HCL 12.5 MG PO TABS: 12.5000 mg | ORAL_TABLET | Freq: Two times a day (BID) | ORAL | 0 refills | Status: DC

## 2018-10-10 MED FILL — CITALOPRAM HBR 10 MG TABLET: 10 | 30 days supply | Qty: 30 | Fill #0

## 2018-10-10 MED FILL — PROMETHAZINE 12.5 MG TABLET: 12.5 | 15 days supply | Qty: 30 | Fill #0

## 2018-10-10 NOTE — Patient Instructions (Addendum)
If you are age 64 or older, your body mass index should be between 23-30. Your Body mass index is 20.92 kg/m. If this is out of the aforementioned range listed, please consider follow up with your Primary Care Provider.  If you are age 18 or younger, your body mass index should be between 19-25. Your Body mass index is 20.92 kg/m. If this is out of the aformentioned range listed, please consider follow up with your Primary Care Provider.   Discontinue gabapentin, Zofran, Desipramine and ibuprofen.  We have sent the following medications to your pharmacy for you to pick up at your convenience: Phenergan 12.5 mg: take twice a day, as needed  START at night to ensure you tolerate it.  Celexa 10 mg: Take once a day.  Thank you for entrusting me with your care and for choosing [facility], Dr. [place] Cellar

## 2018-10-10 NOTE — Progress Notes (Signed)
HPI :  62 y/o female with a longstanding history of nausea / vomiting, GERD, bloating, here for a follow up visit.  The patient reports her symptoms started in 2009. She endorses frequent nausea, associated with frequent regurgitation and "spitting out stomach contents" / vomiting. She states this essentially happens all the time, but is much worse after she eats. He feels she regurgitates and spits out contents of her meals frequently. She does not think this occurs immediately, it'll usually start within half an hour after eating her meal. She feels that whenever she bends over she feels stomach contents regurgitating out of her. She states the food that regurgitates back up is usually partially digested and she usually spits it out and does not swallow it. She otherwise endorses postprandial bloating. She has 2-3 bowel movement per day which are usually soft in nature, she denies any liquid stools. She is currently taking Protonix 40 mg once daily. She has been on twice daily dosing in the past of unclear benefit. Her weight is stable around 130 pounds which she has been for several years. She denies any dysphagia at present.   She has had an extensive evaluation she has seen Dr.Rehman in the past with the following workup over years, per review of patient records and per patient as outlined below. 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 The patient reports she was sent to see behavioral therapist and underwent hypnosis which did not help her symptoms at all.  Prior workup  GES - normal Ph impedance study 09/16/2015- Demeester score of 21.8, done off PPI  Esophageal manometry 09/07/2015 - normal swallows 7/10, 3/10 failed peristalsis, normal LES resting pressure with normal relaxation Small pill study 09/08/2015 - normal gastric, small bowel, and colonic transit HIDA scan normal 2017 [place] normal 2017 Prior small bowel follow through, negative for SMA syndrome CT scan 02/2012 - air  fluid level in esophagus, ? Dysmotility?, otherwise normal  EGD Dec 2011 - normal CT neck May 2012 Colonsocopy 2013 - normal EGD 04/2013 - in ER for possible impaction? Patient had dilation  Since I've met her earlier this year we repeated an EGD in January which was normal and no evidence of EoE. She underwent esophageal manometry testing which showed very mild GEJ outflow obstruction. Otherwise her pH impedance testing was NORMAL - Demeester score of 0.4 on PPI with no correlation of her reflux events to symptoms. 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 We gave her a trial of gabapentin which did not help at all.   Overall she remains largely asymptomatic. Her main complaints is chronic nausea and ongoing regurgitation and reflux. She feels nauseated all the time, regurgitates but does not have any vomiting. Nausea is not related to eating. She denies any early satiety. She denies any postprandial abdominal pain or abdominal distress. Her weight is stable, she states she is otherwise eating okay. No dysphagia. Been taking Zofran which has not helped her nausea at all.  Overall summary of regimens try this far: Tried baclofen 65m TID - did not help Tried buspar 549mBID - did not help Reportedly tried TCAs and reglan - did not help Reported history of SIBO - unclear response to Rifaximin Desipramine - no benefit Gabapentin - no benefit Trial of biofeed back / hypnosis for rumination - no benefit  Most recent workup Ph / impedance testing / esophageal manometry 03/12/18 - Results mostly normal. Very mild GEJ outflow obstruction  pH  impedance study is normal. Demeester 0.4, no correlation of symptoms to reflux episodes. EGD 01/01/2018 - normal esophagus, biopsies taken and no evidence of EoE, benign gastric polyps, normal stomach / duodenum -   Past Medical History:  Diagnosis Date  . Acid reflux 11/07/2010  . GERD (gastroesophageal reflux disease)   .  Hyperlipidemia   . Hypertension 11/07/2010     Past Surgical History:  Procedure Laterality Date  . [place] STUDY N/A 03/03/2018   Procedure: 24 HOUR PH STUDY (on PPI);  Surgeon: Mauri Pole, MD;  Location: [facility];  Service: Endoscopy;  Laterality: N/A;  . BRAVO [place] STUDY  01/10/2011   [name]  . COLONOSCOPY     04/2013  . ESOPHAGEAL MANOMETRY N/A 03/03/2018   Procedure: ESOPHAGEAL MANOMETRY (EM);  Surgeon: Mauri Pole, MD;  Location: [facility];  Service: Endoscopy;  Laterality: N/A;  . ESOPHAGOGASTRODUODENOSCOPY  10/2010  . [place] IMPEDANCE STUDY N/A 03/03/2018   Procedure: PH IMPEDANCE STUDY (on PPI);  Surgeon: Mauri Pole, MD;  Location: [facility];  Service: Endoscopy;  Laterality: N/A;  . TUBAL LIGATION     Family History  Problem Relation Age of Onset  . Stroke Mother   . Kidney disease Sister   . Stroke Sister   . Diabetes Sister   . Diabetes Brother   . Kidney failure Brother   . Other Maternal Grandmother        poor circulation  . Hypertension Maternal Grandmother   . Heart disease Maternal Grandmother   . Colon cancer Neg Hx   . Stomach cancer Neg Hx    Social History   Tobacco Use  . Smoking status: Never Smoker  . Smokeless tobacco: Never Used  Substance Use Topics  . Alcohol use: No    Alcohol/week: 0.0 standard drinks  . Drug use: No   Current Outpatient Medications  Medication Sig Dispense Refill  . amLODipine (NORVASC) 5 MG tablet Take 5 mg by mouth daily.    Marland Kitchen desipramine (NOPRAMIN) 10 MG tablet Take 1 tablet (10 mg total) by mouth at bedtime. Take 10 mg for 2 weeks then increase to 2 tablets (20 mg) at bedtime as tolerated. 60 tablet 2  . famotidine (PEPCID) 20 MG tablet Take 1 tablet (20 mg total) by mouth daily.    Marland Kitchen gabapentin (NEURONTIN) 300 MG capsule Take 1 capsule (300 mg total) by mouth 2 (two) times daily. 60 capsule 1  . ibuprofen (ADVIL,MOTRIN) 200 MG tablet Take 200 mg by mouth as needed for mild pain.     Marland Kitchen ondansetron (ZOFRAN ODT) 4 MG disintegrating tablet Take 1 tablet (4 mg total) by mouth every 8 (eight) hours as needed for nausea or vomiting. Can take 2 tablets every 8 hours if needed 50 tablet 1  . pantoprazole (PROTONIX) 40 MG tablet Take 1 tablet by mouth every morning. 90 tablet 3  . simvastatin (ZOCOR) 20 MG tablet Take 20 mg by mouth daily.    . sucralfate (CARAFATE) 1 g tablet Take 1 tablet (1 g total) by mouth 4 (four) times daily -  with meals and at bedtime. Dissolve tablet in 2 teaspoons water for 15 mins 90 tablet 1  . Multiple Vitamin (MULTIVITAMIN) tablet 1 tablet. Patient states that she takes for 4 weeks then will stop so that her body can take a break.     Current Facility-Administered Medications  Medication Dose Route Frequency Provider Last Rate Last Dose  . 0.9 %  sodium  chloride infusion  500 mL Intravenous Once Aireal Slater, Carlota Raspberry, MD       No Known Allergies   Review of Systems: All systems reviewed and negative except where noted in HPI.   No recent labson file  Physical Exam: BP 122/72   Pulse 68   Ht '5\' 6"'  (1.676 m)   Wt 129 lb 9.6 oz (58.8 kg)   SpO2 99%   BMI 20.92 kg/m  Constitutional: Pleasant, thin female in no acute distress. HEENT: Normocephalic and atraumatic. Conjunctivae are normal. No scleral icterus. Neck supple.  Cardiovascular: Normal rate, regular rhythm.  Pulmonary/chest: Effort normal and breath sounds normal. No wheezing, rales or rhonchi. Abdominal: Soft, nondistended, nontender.  There are no masses palpable. No hepatomegaly. Extremities: no edema Lymphadenopathy: No cervical adenopathy noted. Neurological: Alert and oriented to person place and time. Skin: Skin is warm and dry. No rashes noted. Psychiatric: Normal mood and affect. Behavior is normal.   ASSESSMENT AND PLAN: 62 year old female here for reassessment of the following issues:  Nausea / functional reflux symptoms - she's had an extensive evaluation up to  this point as above and failed numerous trials of therapy. Based off this workup I suspect she is having functional symptoms. She endorses ongoing reflux of gastric contents into her throat, barium studies and pH impedance studies have not shown this, pH impedance study did not correlate reflux episodes with any of her symptoms. Her endoscopy is normal. I reassured her that this is not causing any harm to her health problem from what we can see, I explained functional heartburn to her in the benign nature of it. Goal is to minimize her symptoms. Rumination is a possibility however her symptoms aren't classic for this and prior attempts at therapy for this has not been successful. For her ongoing chronic nausea, we'll try Phenergan as needed, she states she hasn't had this before, she will started at night to ensure she tolerates it. I discussed other options with her, there is some evidence for the use of Celexa with functional reflux. I discussed risks / benefits. We'll try 10 mg daily for a month, titrate up as already noted over time. She will continue the protonix. I think she can stop pepcid, doesn't seem to help. She can continue carafate PRN if it helps.  I spent a lot of time reassuring her today, this may not be something were able to fix but we can try to minimize her symptoms. She agreed with the plan of verbalized understanding.  [place] Cellar, MD [facility]

## 2018-10-22 MED FILL — SUCRALFATE 1 GM TABLET: 1 | 23 days supply | Qty: 90 | Fill #1

## 2018-11-17 ENCOUNTER — Other Ambulatory Visit: Payer: Self-pay | Admitting: Gastroenterology

## 2018-11-17 MED FILL — SUCRALFATE 1 GM TABLET: 1 | 30 days supply | Qty: 120 | Fill #0

## 2018-11-20 MED FILL — SIMVASTATIN 20 MG TABLET: 20 | 90 days supply | Qty: 90 | Fill #1

## 2018-12-12 MED FILL — AMLODIPINE BESYLATE 5 MG TA: 5 | 90 days supply | Qty: 90 | Fill #4

## 2018-12-19 MED FILL — SUCRALFATE 1 GM TABLET: 1 | 30 days supply | Qty: 120 | Fill #1

## 2018-12-19 MED FILL — PANTOPRAZOLE SOD DR 40 MG T: 40 | 90 days supply | Qty: 90 | Fill #1

## 2019-01-19 DIAGNOSIS — M79674 Pain in right toe(s): Secondary | ICD-10-CM | POA: Diagnosis not present

## 2019-01-19 DIAGNOSIS — M7740 Metatarsalgia, unspecified foot: Secondary | ICD-10-CM | POA: Diagnosis not present

## 2019-01-19 DIAGNOSIS — L851 Acquired keratosis [keratoderma] palmaris et plantaris: Secondary | ICD-10-CM | POA: Diagnosis not present

## 2019-01-20 ENCOUNTER — Other Ambulatory Visit: Payer: Self-pay | Admitting: Gastroenterology

## 2019-01-20 MED FILL — SUCRALFATE 1 GM TABLET: 1 | 30 days supply | Qty: 120 | Fill #0

## 2019-02-02 DIAGNOSIS — K219 Gastro-esophageal reflux disease without esophagitis: Secondary | ICD-10-CM | POA: Diagnosis not present

## 2019-02-02 DIAGNOSIS — I10 Essential (primary) hypertension: Secondary | ICD-10-CM | POA: Diagnosis not present

## 2019-02-02 DIAGNOSIS — Z79899 Other long term (current) drug therapy: Secondary | ICD-10-CM | POA: Diagnosis not present

## 2019-02-02 DIAGNOSIS — R1310 Dysphagia, unspecified: Secondary | ICD-10-CM | POA: Diagnosis not present

## 2019-02-11 DIAGNOSIS — K219 Gastro-esophageal reflux disease without esophagitis: Secondary | ICD-10-CM | POA: Diagnosis not present

## 2019-02-11 DIAGNOSIS — Z6820 Body mass index (BMI) 20.0-20.9, adult: Secondary | ICD-10-CM | POA: Diagnosis not present

## 2019-02-11 DIAGNOSIS — I10 Essential (primary) hypertension: Secondary | ICD-10-CM | POA: Diagnosis not present

## 2019-02-11 DIAGNOSIS — E785 Hyperlipidemia, unspecified: Secondary | ICD-10-CM | POA: Diagnosis not present

## 2019-02-18 MED FILL — SUCRALFATE 1 GM TABLET: 1 | 30 days supply | Qty: 120 | Fill #1

## 2019-02-24 ENCOUNTER — Telehealth: Payer: Self-pay | Admitting: Gastroenterology

## 2019-02-24 MED FILL — SIMVASTATIN 20 MG TABLET: 20 | 90 days supply | Qty: 90 | Fill #2 | Status: TO

## 2019-02-24 NOTE — Telephone Encounter (Signed)
The pt has nausea and would like to see Dr Havery Moros and discuss possible H Pylori appt made for 3/16.  The pt has been advised of the information and verbalized understanding.

## 2019-02-24 NOTE — Telephone Encounter (Signed)
Pt's last OV 10.25.20.  Pt requested H. Pylori test.  She has been diagnosed with this in [place] with previous GI doctor.

## 2019-03-02 ENCOUNTER — Other Ambulatory Visit: Payer: Self-pay

## 2019-03-02 ENCOUNTER — Encounter: Payer: Self-pay | Admitting: Gastroenterology

## 2019-03-02 ENCOUNTER — Ambulatory Visit: Payer: 59 | Admitting: Gastroenterology

## 2019-03-02 VITALS — BP 110/70 | HR 76 | Temp 97.7°F | Ht 66.0 in | Wt 130.2 lb

## 2019-03-02 DIAGNOSIS — R12 Heartburn: Secondary | ICD-10-CM | POA: Diagnosis not present

## 2019-03-02 DIAGNOSIS — R110 Nausea: Secondary | ICD-10-CM

## 2019-03-02 DIAGNOSIS — R194 Change in bowel habit: Secondary | ICD-10-CM

## 2019-03-02 MED ORDER — SCOPOLAMINE 1 MG/3DAYS TD PT72: 1.0000 | MEDICATED_PATCH | TRANSDERMAL | 3 refills | Status: DC

## 2019-03-02 MED FILL — SCOPOLAMINE 1 MG/3DAYS PT72: 1 | 30 days supply | Qty: 10 | Fill #0

## 2019-03-02 NOTE — Patient Instructions (Addendum)
If you are age 65 or older, your body mass index should be between 23-30. Your Body mass index is 21.01 kg/m. If this is out of the aforementioned range listed, please consider follow up with your Primary Care Provider.  If you are age 40 or younger, your body mass index should be between 19-25. Your Body mass index is 21.01 kg/m. If this is out of the aformentioned range listed, please consider follow up with your Primary Care Provider.   You have been scheduled for an MRI of the Brain at [facility], located at [address] in the [facility]. Your appointment is scheduled on Friday, 03-13-19 at 1:00pm. Please arrive 30 minutes prior to your appointment time for registration purposes. In addition, if you have any metal in your body, have a pacemaker or defibrillator, please be sure to let your ordering physician know. This test typically takes 45 minutes to 1 hour to complete. Should you need to reschedule, please call [phone].   Take Imodium every morning and then as needed.  We have sent the following medications to your pharmacy for you to pick up at your convenience: Scolpolamine patch: Change every 3 days  You will need to go to the lab for a fasting Cortisol level and a Creatinine.  Please go to the lab in the morning before you have hand anything to eat or drink. They open at 7:30am.  Thank you for entrusting me with your care and for choosing [facility], Dr. [place] Cellar

## 2019-03-02 NOTE — Progress Notes (Signed)
HPI :  63 y/o femalewith a longstanding history of nausea / vomiting, GERD, bloating, here for a follow up visit.  Symptoms ongoing for years, started in 2009. I have seen her multiple times for this. She has chronic nausea, often associated with frequent regurgitation and"spitting out stomach contents" / vomiting.She does not think this occurs immediately,it'll usually start within half an hour after eating her meal. She also feels that whenever she bends over she feels stomach contents regurgitating out of her.She states the food that regurgitates back up is usually partially digested and she usually spits it out and does not swallow it. Since I have last seen her she has had some urgent postprandial loose stools, she has a BM a few times per day, often within 20 minutes of eating if she eats heavilty. No abdominal pains. She does have a lot of borborygmi. Weight has been stable. She continues to take protonix and carafate PRN. She is not taking pepcid. She stopped phenergan which is what I gave her at the last visit, did not help. I also had given her a trial of celexa for functional heartburn since the last visit which did not help and she stopped it. She denies any dysphagia.  She has had an extensive evaluation and has tried multiple regimens, all as outlined below. she has seen Dr.Rehman in the past with the following workup over years, per review of patient records and per patientas outlined below. 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 The patient reports she was sent to see behavioral therapist and underwent hypnosis which did not help her symptoms at all.  Prior workup  GES - normal Ph impedance study 09/16/2015- Demeester score of 21.8, done off PPI  Esophageal manometry 09/07/2015 - normal swallows 7/10, 3/10 failed peristalsis, normal LES resting pressure with normal relaxation Small pill study 09/08/2015 - normal gastric, small bowel, and colonic transit HIDA scan  normal 2017 [place] normal 2017 Prior small bowel follow through, negative for SMA syndrome CT scan 02/2012 - air fluid level in esophagus, ? Dysmotility?, otherwise normal  EGD Dec 2011 - normal CT neck May 2012 Colonsocopy 2013 - normal EGD 04/2013 - in ER for possible impaction?Patient had dilation EGD 12/2017 - normal and no evidence of EoE.  Esophageal manometry testing 02/2018 - very mild GEJ outflow obstruction.  PH impedance testing 02/2018 -  NORMAL - Demeester score of 0.4 on PPI with no correlation of her reflux events to symptoms. 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 We gave her a trial of gabapentin which did not help at all.   Overall summary of regimens try this far: Tried baclofen 10mg  TID- did not help Tried buspar 5mg  BID- did not help Reportedly tried TCAs and reglan- did not help Reported history of SIBO- unclear response to Rifaximin Desipramine - no benefit Gabapentin - no benefit Trial of biofeed back / hypnosis for rumination - no benefit Trial of celexa - no benefit  Most recent workup Ph / impedance testing / esophageal manometry 03/12/18 - Results mostly normal. Very mild GEJ outflow obstruction  pH impedance study is normal. Demeester 0.4, no correlation of symptoms to reflux episodes. EGD 01/01/2018 - normal esophagus, biopsies taken and no evidence of EoE, benign gastric polyps, normal stomach / duodenum     Past Medical History:  Diagnosis Date  . Acid reflux 11/07/2010  . GERD (gastroesophageal reflux disease)   . Hyperlipidemia   . Hypertension 11/07/2010  Past Surgical History:  Procedure Laterality Date  . [place] STUDY N/A 03/03/2018   Procedure: 24 HOUR PH STUDY (on PPI);  Surgeon: Mauri Pole, MD;  Location: [facility];  Service: Endoscopy;  Laterality: N/A;  . BRAVO [place] STUDY  01/10/2011   [name]  . COLONOSCOPY     04/2013  . ESOPHAGEAL MANOMETRY N/A 03/03/2018   Procedure:  ESOPHAGEAL MANOMETRY (EM);  Surgeon: Mauri Pole, MD;  Location: [facility];  Service: Endoscopy;  Laterality: N/A;  . ESOPHAGOGASTRODUODENOSCOPY  10/2010  . [place] IMPEDANCE STUDY N/A 03/03/2018   Procedure: PH IMPEDANCE STUDY (on PPI);  Surgeon: Mauri Pole, MD;  Location: [facility];  Service: Endoscopy;  Laterality: N/A;  . TUBAL LIGATION     Family History  Problem Relation Age of Onset  . Stroke Mother   . Kidney disease Sister   . Stroke Sister   . Diabetes Sister   . Diabetes Brother   . Kidney failure Brother   . Other Maternal Grandmother        poor circulation  . Hypertension Maternal Grandmother   . Heart disease Maternal Grandmother   . Colon cancer Neg Hx   . Stomach cancer Neg Hx    Social History   Tobacco Use  . Smoking status: Never Smoker  . Smokeless tobacco: Never Used  Substance Use Topics  . Alcohol use: No    Alcohol/week: 0.0 standard drinks  . Drug use: No   Current Outpatient Medications  Medication Sig Dispense Refill  . amLODipine (NORVASC) 5 MG tablet Take 5 mg by mouth daily.    . famotidine (PEPCID) 20 MG tablet Take 1 tablet (20 mg total) by mouth daily.    . Multiple Vitamin (MULTIVITAMIN) tablet 1 tablet. Patient states that she takes for 4 weeks then will stop so that her body can take a break.    . pantoprazole (PROTONIX) 40 MG tablet Take 1 tablet by mouth every morning. 90 tablet 3  . promethazine (PHENERGAN) 12.5 MG tablet Take 1 tablet (12.5 mg total) by mouth 2 (two) times daily. Start this medication at night to start to make sure you tolerate well. 30 tablet 0  . simvastatin (ZOCOR) 20 MG tablet Take 20 mg by mouth daily.    . sucralfate (CARAFATE) 1 g tablet DISSOLVE 1 TABLET IN 2 TEASPOONS OF WATER FOR 15 MINS TO MAKE A SLURRY AND THEN TAKE BY MOUTH 4 TIMES A DAY WITH MEALS AND AT BEDTIME 120 tablet 1   Current Facility-Administered Medications  Medication Dose Route Frequency Provider Last Rate Last Dose  . 0.9  %  sodium chloride infusion  500 mL Intravenous Once Camari Quintanilla, Carlota Raspberry, MD       No Known Allergies   Review of Systems: All systems reviewed and negative except where noted in HPI.    Physical Exam: BP 110/70   Pulse 76   Temp 97.7 F (36.5 C)   Ht 5\' 6"  (1.676 m)   Wt 130 lb 3.2 oz (59.1 kg)   SpO2 99%   BMI 21.01 kg/m  Constitutional: Pleasant, thin female in no acute distress. HEENT: Normocephalic and atraumatic. Conjunctivae are normal. No scleral icterus. Neck supple.  Cardiovascular: Normal rate, regular rhythm.  Pulmonary/chest: Effort normal and breath sounds normal. No wheezing, rales or rhonchi. Abdominal: Soft, nondistended, nontender.  There are no masses palpable. No hepatomegaly. Extremities: no edema Lymphadenopathy: No cervical adenopathy noted. Neurological: Alert and oriented to person place and  time. Skin: Skin is warm and dry. No rashes noted. Psychiatric: Normal mood and affect. Behavior is normal.   ASSESSMENT AND PLAN: 63 y/o female here for reassessment of the following issues:  Chronic nausea / functional reflux symptoms / change in bowel habits - extensive evaluation as outlined, tried numerous trials of therapy including behavioral therapy and hypnosis for possible rumination, none of which has helped. Her barium swallows, EGDs, manometry, pH testing, GES are all normal - I think she has functional symptoms which I explained to her at length today. Unfortunately she has not had any benefit and symptoms largely persist. From the nausea perspective, this is chronic and bothers her significantly. She states she had recent normal labs per PCP, will obtain these. She's not had CNS imaging, will refer for MRI brain, and will screen for adrenal insufficiency to make sure we aren't missing that, but think unlikely. I offered her a trial of scopolamine patch given nothing has helped so far, counseled her on dry mouth, etc, but will see if it helps. She can  take some immodium as needed for loose stool. If no benefit on this regimen will try accupuncture and referral back to behavioral health. She also can consider tertiary care center evaluation. She agreed.  Nicholson Cellar, MD [facility]

## 2019-03-04 ENCOUNTER — Other Ambulatory Visit: Payer: Self-pay | Admitting: Gastroenterology

## 2019-03-04 DIAGNOSIS — R110 Nausea: Secondary | ICD-10-CM | POA: Diagnosis not present

## 2019-03-04 DIAGNOSIS — R12 Heartburn: Secondary | ICD-10-CM | POA: Diagnosis not present

## 2019-03-05 LAB — CREATININE, SERUM
Creatinine, Ser: 0.74 mg/dL (ref 0.57–1.00)
GFR, EST AFRICAN AMERICAN: 99 mL/min/{1.73_m2} (ref >59)
GFR, EST NON AFRICAN AMERICAN: 86 mL/min/{1.73_m2} (ref >59)

## 2019-03-05 LAB — CORTISOL-AM, BLOOD: Cortisol - AM: 10.5 ug/dL (ref 6.2–19.4)

## 2019-03-10 ENCOUNTER — Other Ambulatory Visit (HOSPITAL_COMMUNITY): Payer: Self-pay | Admitting: Internal Medicine

## 2019-03-10 ENCOUNTER — Other Ambulatory Visit: Payer: Self-pay

## 2019-03-10 ENCOUNTER — Ambulatory Visit (HOSPITAL_COMMUNITY)
Admission: RE | Admit: 2019-03-10 | Discharge: 2019-03-10 | Disposition: A | Discharge status: Home / Self Care | Payer: PRIVATE HEALTH INSURANCE | Source: Ambulatory Visit | Attending: Internal Medicine | Admitting: Internal Medicine

## 2019-03-10 DIAGNOSIS — M25552 Pain in left hip: Secondary | ICD-10-CM | POA: Diagnosis not present

## 2019-03-11 MED FILL — AMLODIPINE BESYLATE 5 MG TA: 5 | 90 days supply | Qty: 90 | Fill #0

## 2019-03-12 DIAGNOSIS — S0093XA Contusion of unspecified part of head, initial encounter: Secondary | ICD-10-CM | POA: Diagnosis not present

## 2019-03-13 ENCOUNTER — Ambulatory Visit (HOSPITAL_COMMUNITY): Payer: 59

## 2019-03-23 MED FILL — PANTOPRAZOLE SOD DR 40 MG T: 40 | 90 days supply | Qty: 90 | Fill #0

## 2019-03-25 ENCOUNTER — Emergency Department (HOSPITAL_COMMUNITY)
Admission: EM | Admit: 2019-03-25 | Discharge: 2019-03-25 | Disposition: A | Discharge status: Home / Self Care | Payer: PRIVATE HEALTH INSURANCE | Attending: Emergency Medicine | Admitting: Emergency Medicine

## 2019-03-25 ENCOUNTER — Other Ambulatory Visit: Payer: Self-pay

## 2019-03-25 ENCOUNTER — Emergency Department (HOSPITAL_COMMUNITY): Payer: PRIVATE HEALTH INSURANCE

## 2019-03-25 ENCOUNTER — Encounter (HOSPITAL_COMMUNITY): Payer: Self-pay | Admitting: Emergency Medicine

## 2019-03-25 DIAGNOSIS — I10 Essential (primary) hypertension: Secondary | ICD-10-CM | POA: Insufficient documentation

## 2019-03-25 DIAGNOSIS — Z79899 Other long term (current) drug therapy: Secondary | ICD-10-CM | POA: Diagnosis not present

## 2019-03-25 DIAGNOSIS — G44319 Acute post-traumatic headache, not intractable: Secondary | ICD-10-CM | POA: Insufficient documentation

## 2019-03-25 DIAGNOSIS — G44309 Post-traumatic headache, unspecified, not intractable: Secondary | ICD-10-CM | POA: Diagnosis not present

## 2019-03-25 MED FILL — DICLOFENAC SOD EC 50 MG TAB: 50 | 30 days supply | Qty: 60 | Fill #0

## 2019-03-25 NOTE — ED Provider Notes (Signed)
[facility] EMERGENCY DEPARTMENT Provider Note   CSN: [id number] Arrival date & time: 03/25/19  1411    History   Chief Complaint Chief Complaint  Patient presents with  . Headache    HPI Dawn Stafford is a 63 y.o. female with a history of htn, GERD and hyperlipidemia presenting with persistent headache since falling and striking her head on 3/20.  She was on our campus when she tripped at the 3rd step from the bottom of a stairwell, fell forward and hitting her left lateral head against the wall. She denies having loc but felt dazed after the event.  She was seen by her pcp 4 days after the fall at which time lumbar hip imaging was negative (and these injuries are now improved). Her headache worsened this past weekend. She called her pcp who recommended coming her for CT imaging.  She denies any worsened nausea (has sx at baseline with her gerd), no vomiting, denies photophobia, dizziness, focal weakness.  She last took tylenol 4 hours ago which "takes the edge off".       The history is provided by the patient.    Past Medical History:  Diagnosis Date  . Acid reflux 11/07/2010  . GERD (gastroesophageal reflux disease)   . Hyperlipidemia   . Hypertension 11/07/2010    Patient Active Problem List   Diagnosis Date Noted  . Regurgitation and rechewing   . Dysphagia   . GERD (gastroesophageal reflux disease) 11/03/2013  . HTN (hypertension) 11/03/2013  . Hyperlipemia 11/03/2013  . Vomiting 11/03/2013    Past Surgical History:  Procedure Laterality Date  . [place] STUDY N/A 03/03/2018   Procedure: 24 HOUR PH STUDY (on PPI);  Surgeon: Mauri Pole, MD;  Location: [facility];  Service: Endoscopy;  Laterality: N/A;  . BRAVO [place] STUDY  01/10/2011   [name]  . COLONOSCOPY     04/2013  . ESOPHAGEAL MANOMETRY N/A 03/03/2018   Procedure: ESOPHAGEAL MANOMETRY (EM);  Surgeon: Mauri Pole, MD;  Location: [facility];  Service: Endoscopy;  Laterality: N/A;  .  ESOPHAGOGASTRODUODENOSCOPY  10/2010  . [place] IMPEDANCE STUDY N/A 03/03/2018   Procedure: PH IMPEDANCE STUDY (on PPI);  Surgeon: Mauri Pole, MD;  Location: [facility];  Service: Endoscopy;  Laterality: N/A;  . TUBAL LIGATION       OB History    Gravida  1   Para      Term      Preterm      AB  1   Living        SAB  1   TAB      Ectopic      Multiple      Live Births               Home Medications    Prior to Admission medications   Medication Sig Start Date End Date Taking? Authorizing Provider  amLODipine (NORVASC) 5 MG tablet Take 5 mg by mouth daily.    [provider]  famotidine (PEPCID) 20 MG tablet Take 1 tablet (20 mg total) by mouth daily. 09/15/18   Armbruster, Carlota Raspberry, MD  Multiple Vitamin (MULTIVITAMIN) tablet 1 tablet. Patient states that she takes for 4 weeks then will stop so that her body can take a break.    [provider]  pantoprazole (PROTONIX) 40 MG tablet Take 1 tablet by mouth every morning. 07/25/18   Esterwood, Amy S, PA-C  promethazine (PHENERGAN) 12.5 MG  tablet Take 1 tablet (12.5 mg total) by mouth 2 (two) times daily. Start this medication at night to start to make sure you tolerate well. 10/10/18   Armbruster, Carlota Raspberry, MD  scopolamine (TRANSDERM-SCOP, 1.5 MG,) 1 MG/3DAYS Place 1 patch (1.5 mg total) onto the skin every 3 (three) days. 03/02/19   Armbruster, Carlota Raspberry, MD  simvastatin (ZOCOR) 20 MG tablet Take 20 mg by mouth daily.    [provider]  sucralfate (CARAFATE) 1 g tablet DISSOLVE 1 TABLET IN 2 TEASPOONS OF WATER FOR 15 MINS TO MAKE A SLURRY AND THEN TAKE BY MOUTH 4 TIMES A DAY WITH MEALS AND AT BEDTIME 01/20/19   Armbruster, Carlota Raspberry, MD    Family History Family History  Problem Relation Age of Onset  . Stroke Mother   . Kidney disease Sister   . Stroke Sister   . Diabetes Sister   . Diabetes Brother   . Kidney failure Brother   . Other Maternal Grandmother        poor circulation  .  Hypertension Maternal Grandmother   . Heart disease Maternal Grandmother   . Colon cancer Neg Hx   . Stomach cancer Neg Hx     Social History Social History   Tobacco Use  . Smoking status: Never Smoker  . Smokeless tobacco: Never Used  Substance Use Topics  . Alcohol use: No    Alcohol/week: 0.0 standard drinks  . Drug use: No     Allergies   Patient has no known allergies.   Review of Systems Review of Systems  Constitutional: Negative for fever.  HENT: Negative for congestion and sore throat.   Eyes: Negative.  Negative for photophobia and visual disturbance.  Respiratory: Negative for chest tightness and shortness of breath.   Cardiovascular: Negative for chest pain.  Gastrointestinal: Positive for nausea. Negative for abdominal pain.  Genitourinary: Negative.   Musculoskeletal: Negative for arthralgias, joint swelling and neck pain.  Skin: Negative.  Negative for rash and wound.  Neurological: Positive for headaches. Negative for dizziness, speech difficulty, weakness, light-headedness and numbness.  Psychiatric/Behavioral: Negative.      Physical Exam Updated Vital Signs BP (!) 155/67 (BP Location: Left Arm)   Pulse 99   Temp 98.8 F (37.1 C) (Oral)   Resp 17   SpO2 99%   Physical Exam Vitals signs and nursing note reviewed.  Constitutional:      Appearance: She is well-developed. She is not ill-appearing.     Comments: Uncomfortable appearing  HENT:     Head: Normocephalic and atraumatic.  Eyes:     General: No visual field deficit.    Pupils: Pupils are equal, round, and reactive to light.  Neck:     Musculoskeletal: Normal range of motion and neck supple.  Cardiovascular:     Rate and Rhythm: Normal rate.     Heart sounds: Normal heart sounds.  Pulmonary:     Effort: Pulmonary effort is normal.  Abdominal:     Palpations: Abdomen is soft.     Tenderness: There is no abdominal tenderness.  Musculoskeletal: Normal range of motion.   Lymphadenopathy:     Cervical: No cervical adenopathy.  Skin:    General: Skin is warm and dry.     Findings: No rash.  Neurological:     Mental Status: She is alert and oriented to person, place, and time.     GCS: GCS eye subscore is 4. GCS verbal subscore is 5. GCS motor subscore is  6.     Cranial Nerves: No cranial nerve deficit, dysarthria or facial asymmetry.     Sensory: No sensory deficit.     Coordination: Coordination normal.     Gait: Gait normal.     Comments: Normal heel-shin, normal rapid alternating movements. Cranial nerves III-XII intact.  No pronator drift.  Psychiatric:        Speech: Speech normal.        Behavior: Behavior normal.        Thought Content: Thought content normal.      ED Treatments / Results  Labs (all labs ordered are listed, but only abnormal results are displayed) Labs Reviewed - No data to display  EKG None  Radiology Ct Head Wo Contrast  Result Date: 03/25/2019 CLINICAL DATA:  FALL 3 WEEKS AGO, HIT LEFT SIDE OF HEAD ON WALL, DENIES LOC, INTERMITTENT HEADACHES EVER SINCE. EXAM: CT HEAD WITHOUT CONTRAST TECHNIQUE: Contiguous axial images were obtained from the base of the skull through the vertex without intravenous contrast. COMPARISON:  None. FINDINGS: Brain: No evidence of acute infarction, hemorrhage, hydrocephalus, extra-axial collection or mass lesion/mass effect. Mild patchy bilateral white matter hypoattenuation, mostly subcortical white matter, consistent with chronic microvascular ischemic change. Vascular: No hyperdense vessel or unexpected calcification. Skull: Normal. Negative for fracture or focal lesion. Sinuses/Orbits: Globes and orbits are unremarkable. Visualized sinuses are essentially clear. Other: None. IMPRESSION: 1. No acute intracranial abnormalities. 2. Mild chronic microvascular ischemic change. Electronically Signed   By: Lajean Manes M.D.   On: 03/25/2019 15:14    Procedures Procedures (including critical care  time)  Medications Ordered in ED Medications - No data to display   Initial Impression / Assessment and Plan / ED Course  I have reviewed the triage vital signs and the nursing notes.  Pertinent labs & imaging results that were available during my care of the patient were reviewed by me and considered in my medical decision making (see chart for details).        Pt with 55 week old h/o head injury, headache starting this weekend, no neuro deficits on exam.  Ct imaging today negative for acute injury/bleed.  She states her pcp called in pain medicine (mail order) which she should get tomorrow.  Minor head injury instructions given. Precautions outlined. Given injury almost 63 weeks old, low risk for worsening sx at this time.  Prn f/u anticipated with pcp as needed for new or worsening sx.   Final Clinical Impressions(s) / ED Diagnoses   Final diagnoses:  Acute post-traumatic headache, not intractable    ED Discharge Orders    None       Landis Martins 03/25/19 1518    Dorie Rank, MD 03/28/19 [phone]

## 2019-03-25 NOTE — ED Triage Notes (Signed)
Pt c/o headaches intermittent x 4 days. [place] [address] and hit head on march 20 but no loc. A/o. Nad. Moving all extremities. Denies dizziness.

## 2019-03-25 NOTE — Discharge Instructions (Signed)
Your CT imaging is negative today for any internal injury or bleeding from your head injury.  You may continue taking your tylenol and/or the new pain medicine your doctor has prescribed you.  Refer to the head injury instructions outlined.  Plan a recheck with your doctor if your symptoms persist or worsen.

## 2019-03-27 ENCOUNTER — Other Ambulatory Visit: Payer: Self-pay | Admitting: Gastroenterology

## 2019-03-30 MED FILL — SUCRALFATE 1 GM TABLET: 1 | 30 days supply | Qty: 120 | Fill #0

## 2019-04-01 DIAGNOSIS — M545 Low back pain: Secondary | ICD-10-CM | POA: Diagnosis not present

## 2019-04-01 DIAGNOSIS — F3289 Other specified depressive episodes: Secondary | ICD-10-CM | POA: Diagnosis not present

## 2019-04-01 DIAGNOSIS — M25552 Pain in left hip: Secondary | ICD-10-CM | POA: Diagnosis not present

## 2019-04-22 MED FILL — METHYLPREDNISOLONE 4 MG TBP: 4 | 6 days supply | Qty: 21 | Fill #0

## 2019-04-22 MED FILL — SUCRALFATE 1 GM TABLET: 1 | 30 days supply | Qty: 120 | Fill #1

## 2019-04-23 ENCOUNTER — Ambulatory Visit: Payer: 59 | Admitting: Orthopaedic Surgery

## 2019-04-29 ENCOUNTER — Other Ambulatory Visit: Payer: Self-pay

## 2019-04-29 ENCOUNTER — Telehealth (HOSPITAL_COMMUNITY): Payer: Self-pay

## 2019-04-29 ENCOUNTER — Encounter (HOSPITAL_COMMUNITY): Payer: Self-pay

## 2019-04-29 ENCOUNTER — Ambulatory Visit (HOSPITAL_COMMUNITY): Payer: PRIVATE HEALTH INSURANCE | Attending: Orthopedic Surgery

## 2019-04-29 DIAGNOSIS — M545 Low back pain, unspecified: Secondary | ICD-10-CM

## 2019-04-29 DIAGNOSIS — M6281 Muscle weakness (generalized): Secondary | ICD-10-CM | POA: Insufficient documentation

## 2019-04-29 NOTE — Telephone Encounter (Signed)
In-office visit- Changed occurance code to 04 worker's comp, Leafy Ro put it in as private insurance. NF 04/29/2019

## 2019-04-29 NOTE — Therapy (Signed)
[place], [place], [address] Phone: [phone]   Fax:  [phone]  Physical Therapy Evaluation  Patient Details  Name: Dawn Stafford MRN: [id number] Date of Birth: Jun 16, 1956 Referring Provider (PT): Renette Butters, MD   Encounter Date: 04/29/2019   PT End of Session - 04/29/19 1741   Visit Number 1  Number of Visits 9  Date for PT Re-Evaluation 05/27/19  Authorization  Authorization Type Workmans Comp (1-eval and 8 visits approved)  Authorization Time Period 04/29/19-05/29/19  Authorization - Visit Number 0  Authorization - Number of Visits 8  PT Time Calculation  PT Start Time 1610  PT Stop Time 1650  PT Time Calculation (min) 40 min  PT - End of Session  Activity Tolerance Patient tolerated treatment well  Behavior During Therapy [facility] for tasks assessed/performed     Past Medical History:  Diagnosis Date  . Acid reflux 11/07/2010  . GERD (gastroesophageal reflux disease)   . Hyperlipidemia   . Hypertension 11/07/2010    Past Surgical History:  Procedure Laterality Date  . [place] STUDY N/A 03/03/2018   Procedure: 24 HOUR PH STUDY (on PPI);  Surgeon: Mauri Pole, MD;  Location: [facility];  Service: Endoscopy;  Laterality: N/A;  . BRAVO [place] STUDY  01/10/2011   Dawn Stafford  . COLONOSCOPY     04/2013  . ESOPHAGEAL MANOMETRY N/A 03/03/2018   Procedure: ESOPHAGEAL MANOMETRY (EM);  Surgeon: Mauri Pole, MD;  Location: [facility];  Service: Endoscopy;  Laterality: N/A;  . ESOPHAGOGASTRODUODENOSCOPY  10/2010  . [place] IMPEDANCE STUDY N/A 03/03/2018   Procedure: PH IMPEDANCE STUDY (on PPI);  Surgeon: Mauri Pole, MD;  Location: [facility];  Service: Endoscopy;  Laterality: N/A;  . TUBAL LIGATION      There were no vitals filed for this visit.     Subjective Assessment - 04/29/19 1614   Subjective Dawn Stafford reports while walking up the stairs at work on 03/06/2019 she  slipped and fell. She states she slipped down 3 steps to a landing and fall against the concrete wall and floor hitting her Lt side. She reports initially she had a large bruise along her Lt hip but that has resolved however she remains sore along her left lower back and hip. She has been on FMLA since 03/23/19 and is not scheduled to go back to work at this time. 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 She states if she were to return there is no such thing as "light duty".  Limitations House hold activities;Lifting;Standing;Walking  How long can you sit comfortably? not limited  How long can you stand comfortably? 5 minutes of cleaning in standing  How long can you walk comfortably? 30-40 minutes  Patient Stated Goals to have no pain with household and work activities  Pain Assessment  Currently in Pain? Yes  Pain Score 7  Pain Location Back  Pain Orientation Left  Pain Descriptors / Indicators Aching  Pain Type Acute pain (subacute)  Aggravating Factors  working around the house, laundry is difficult and she can only carry light grocery bags  Pain Relieving Factors laying on her Rt side is the most comfortable position.     [facility] PT Assessment - 04/29/19 0001   Medical Diagnosis Low Back Pain  Referring Provider (PT) Renette Butters, MD  Onset Date/Surgical  Date 03/06/19  Prior Therapy none  Precautions  Precautions None  Restrictions  Weight Bearing Restrictions No  Balance Screen  Has the patient fallen in the past 6 months Yes  How many times? 1  Has the patient had a decrease in activity level because of a fear of falling?  Yes  Is the patient reluctant to leave their home because of a fear of falling?  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) (hours are currently 11am-7pm)  Leisure Pt has enjoyed getting out to walk about 35-45 minutes but is limited by work schedule sometimes  Cognition  Overall Cognitive Status Within Functional Limits for tasks assessed  Observation/Other Assessments  Focus on Therapeutic Outcomes (FOTO)  52% limited  Posture/Postural Control  Posture/Postural Control No significant limitations  ROM / Strength  AROM / PROM / Strength AROM;Strength  AROM  Overall AROM Comments Quadrants: Patient with discomfort in Rt flex; Lt ext quadrants  AROM Assessment Site Lumbar  Lumbar Flexion WFL (pain in Lt  lower back)  Lumbar Extension WFL  Lumbar - Right Side Bend WFL (pain in Lt  lower back)  Lumbar - Left Side Bend WFL (pain in Lt  lower back)  Strength  Strength Assessment Site Hip;Knee;Ankle  Right/Left Knee Right;Left  Right/Left Ankle Right;Left  Right Ankle Dorsiflexion 4+/5  Left Ankle Dorsiflexion 4+/5  Right Knee Flexion 4-/5  Right Knee Extension 4/5  Left Knee Flexion 4-/5  Left Knee Extension 4/5  Right Hip Flexion 4/5  Right Hip Extension 3/5  Right Hip ABduction 3-/5  Left Hip Flexion 4/5  Left Hip Extension 3/5  Left Hip ABduction 3-/5  Palpation  Spinal mobility Normal mobility and no pain/symptom provocation with PA's to L1-5  SI assessment  normal alignment with standing, supine, and prone.  Palpation comment tenderness to palpation along Lt quad lumborem and Lt gluteus medius    Objective measurements completed on examination: See above findings.    PT Education - 04/29/19 1743    Education Details  Educated  patient on appropriate POC and interventions that will be used for treatment.    Person(s) Educated  Patient    Methods  Explanation    Comprehension  Verbalized understanding       PT Short Term Goals - 04/29/19 1743      PT SHORT TERM GOAL #1   Title  Patient will be independent with HEP, updated PRN, to improve functional strength and activity tolerance.    Time  2    Period  Weeks    Target Date  05/13/19      PT SHORT TERM GOAL #2   Title  Patient will demonstrate full pain free motion with lumbar AROM indicating reduced sensitivity to movement to return to prior activities.    Time  2    Period  Weeks    Status  New        PT Long Term Goals - 04/29/19 1743      PT  LONG TERM GOAL #1   Title  Patient will demonstrate proper squat form with no pain to demonstrate improve mobility to return to work duties and home activities such as laundray and cleaning.    Time  4    Period  Weeks    Status  New    Target Date  05/27/19      PT LONG TERM GOAL #2   Title  Patient will demonstrate 1 grade improvement in MMT for bil LE's for improved functional LE strength to be able to perform work duties for full 8 hour shifts.    Time  4    Period  Weeks    Status  New      PT LONG TERM GOAL #3   Title  Patient will achieve 20 seconds for SLS on bil LE's to indicate improved balance to be able to safely negotiate stairs with step over step pattern.    Time  4    Period  Weeks    Status  New         Plan - 04/29/19 1742   Clinical Impression Statement Ms. Bartle presents to physical therapy for evaluation of lower back pain following a fall at work on 03/06/19. She presents with good functional ROM throughout lumbar spine and no hypomobilities or abnormal postural alignments. She reports pain provocation with AROM into Rt flexion and Lt extension quadrants and has tenderness to palpation along Lt quadratus lumborum. Based on objective testing patient is likely presenting with pain  referral from a musculoskeletal source. She was found to be weak throughout bil LE's primarily around her hips and will benefit from manual interventions to address pain and muscle restrictions as well as therapeutic strengthening to improve function and mobility. Patient will benefit from skilled physical therapy for above mentioned reasons.   Personal Factors and Comorbidities Age;Comorbidity 1  Comorbidities HTN  Examination-Activity Limitations Squat;Stand;Sit;Reach Overhead;Stairs  Examination-Participation Restrictions Cleaning;Community Activity;Laundry;Yard Work;Other  Pt will benefit from skilled therapeutic intervention in order to improve on the following deficits Decreased activity tolerance;Decreased balance;Decreased mobility;Increased muscle spasms;Impaired flexibility;Pain;Decreased strength;Decreased endurance;Impaired perceived functional ability;Increased fascial restricitons  Stability/Clinical Decision Making Stable/Uncomplicated  Clinical Decision Making Low  Rehab Potential Good  PT Frequency 2x / week  PT Duration 4 weeks  PT Treatment/Interventions ADLs/Self Care Home Management;Cryotherapy;Electrical Stimulation;Iontophoresis 4mg /ml Dexamethasone;Moist Heat;Functional mobility training;Therapeutic activities;Therapeutic exercise;Balance training;Neuromuscular re-education;Patient/family education;Manual techniques;Passive range of motion;Joint Manipulations;Spinal Manipulations  PT Next Visit Plan Review goals and initiate  Consulted and Agree with Plan of Care Patient     Patient will benefit from skilled therapeutic intervention in order to improve the following deficits and impairments:  Decreased activity tolerance, Decreased balance, Decreased mobility, Increased muscle spasms, Impaired flexibility, Pain, Decreased strength, Decreased endurance, Impaired perceived functional ability, Increased fascial restricitons  Visit Diagnosis: Left-sided low back pain without  sciatica, unspecified chronicity  Muscle weakness (generalized)     Problem List  Patient Active Problem List   Diagnosis Date Noted  . Regurgitation and rechewing   . Dysphagia   . GERD (gastroesophageal reflux disease) 11/03/2013  . HTN (hypertension) 11/03/2013  . Hyperlipemia 11/03/2013  . Vomiting 11/03/2013    Kipp Brood, PT, DPT, [facility] Physical Therapist with [facility]  04/29/2019 5:44 PM    [place] [address] [place], [place], [address] Phone: [phone]   Fax:  [phone]  Name: Dawn Stafford MRN: [id number] Date of Birth: 20-Dec-1955

## 2019-05-01 ENCOUNTER — Telehealth (HOSPITAL_COMMUNITY): Payer: Self-pay

## 2019-05-01 ENCOUNTER — Ambulatory Visit (HOSPITAL_COMMUNITY): Payer: PRIVATE HEALTH INSURANCE

## 2019-05-01 DIAGNOSIS — M25552 Pain in left hip: Secondary | ICD-10-CM | POA: Diagnosis not present

## 2019-05-01 DIAGNOSIS — M545 Low back pain: Secondary | ICD-10-CM | POA: Diagnosis not present

## 2019-05-01 NOTE — Telephone Encounter (Signed)
I called Ms. Fishel at her home number today and spoke directly to her. I informed her that the therapist she was scheduled to see today was not going to be in as late as her appointment is set for. I apologized for the scheduling mix up. I informed her we would cancel today's visit and start therapy on Monday 05/04/19 at 4:45 PM. She was agreeable to this plan.  Kipp Brood, PT, DPT, [facility] Physical Therapist with [facility]  05/01/2019 9:11 AM

## 2019-05-04 ENCOUNTER — Other Ambulatory Visit: Payer: Self-pay

## 2019-05-04 ENCOUNTER — Ambulatory Visit (HOSPITAL_COMMUNITY): Payer: PRIVATE HEALTH INSURANCE | Attending: Internal Medicine | Admitting: Physical Therapy

## 2019-05-04 DIAGNOSIS — M6281 Muscle weakness (generalized): Secondary | ICD-10-CM

## 2019-05-04 DIAGNOSIS — M545 Low back pain, unspecified: Secondary | ICD-10-CM

## 2019-05-04 NOTE — Patient Instructions (Signed)
Bridge    Lie back, legs bent. Inhale, pressing hips up. Keeping ribs in, lengthen lower back. Exhale, rolling down along spine from top. Repeat _10___ times.  Straight Leg Raise    Keep opposite knee bent and tighten stomach.  Lift and lower leg slowly, controlled.  Repeat __10__times.   Abduction: Side Leg Lift   Lie on side. Lift top leg slightly higher than shoulder level. Keep top leg straight with body, toes pointing forward. Slowly lower for 3-5 seconds. _10__ reps ___ sets    HIP / KNEE: Extension - Prone    Squeeze glutes. Raise leg up. Keep knee straight. 10___ reps

## 2019-05-04 NOTE — Therapy (Signed)
[place], [place], [address] Phone: [phone]   Fax:  [phone]  Physical Therapy Treatment  Patient Details  Name: Dawn Stafford MRN: [id number] Date of Birth: 23-Sep-1956 Referring Provider (PT): Renette Butters, MD   Encounter Date: 05/04/2019  PT End of Session - 05/04/19 1719    Visit Number  2    Number of Visits  9    Date for PT Re-Evaluation  05/27/19    Authorization Type  Workmans Comp (1-eval and 8 visits approved)    Authorization Time Period  04/29/19-05/29/19    Authorization - Visit Number  2    Authorization - Number of Visits  9    PT Start Time  5465    PT Stop Time  1730    PT Time Calculation (min)  45 min    Activity Tolerance  Patient tolerated treatment well    Behavior During Therapy  [facility] for tasks assessed/performed       Past Medical History:  Diagnosis Date  . Acid reflux 11/07/2010  . GERD (gastroesophageal reflux disease)   . Hyperlipidemia   . Hypertension 11/07/2010    Past Surgical History:  Procedure Laterality Date  . [place] STUDY N/A 03/03/2018   Procedure: 24 HOUR PH STUDY (on PPI);  Surgeon: Mauri Pole, MD;  Location: [facility];  Service: Endoscopy;  Laterality: N/A;  . BRAVO [place] STUDY  01/10/2011   [name]  . COLONOSCOPY     04/2013  . ESOPHAGEAL MANOMETRY N/A 03/03/2018   Procedure: ESOPHAGEAL MANOMETRY (EM);  Surgeon: Mauri Pole, MD;  Location: [facility];  Service: Endoscopy;  Laterality: N/A;  . ESOPHAGOGASTRODUODENOSCOPY  10/2010  . [place] IMPEDANCE STUDY N/A 03/03/2018   Procedure: PH IMPEDANCE STUDY (on PPI);  Surgeon: Mauri Pole, MD;  Location: [facility];  Service: Endoscopy;  Laterality: N/A;  . TUBAL LIGATION      There were no vitals filed for this visit.  Subjective Assessment - 05/04/19 1651    Subjective  pt states her pain is still 7/10 in her Lt hip.  States she is currently not doing any regular  exercises.     Currently in Pain?  Yes    Pain Score  7     Pain Location  Hip    Pain Orientation  Left    Pain Descriptors / Indicators  Aching    Pain Type  Acute pain                       [facility] Adult PT Treatment/Exercise - 05/04/19 0001      Knee/Hip Exercises: Seated   Sit to Sand  10 reps;without UE support      Knee/Hip Exercises: Supine   Bridges  10 reps    Straight Leg Raises  Both;10 reps      Knee/Hip Exercises: Sidelying   Hip ABduction  Both;10 reps    Clams  both 10 reps 5" holds      Knee/Hip Exercises: Prone   Hamstring Curl  1 set;10 reps    Hip Extension  Both;1 set;10 reps      Modalities   Modalities  Moist Heat      Moist Heat Therapy   Number Minutes Moist Heat  5 Minutes    Moist Heat Location  Hip   Lt hip in Rt sidelying     Manual Therapy   Manual Therapy  Soft tissue mobilization    Manual therapy comments  in Rt sidelying completed at end of session following MHP    Soft tissue mobilization  to Lt hip and glute to reduce tightness, pain.             PT Education - 05/04/19 1700    Education Details  reveiwed goals for therapy and initiated HEP    Person(s) Educated  Patient    Methods  Explanation;Demonstration;Tactile cues;Verbal cues;Handout    Comprehension  Verbalized understanding;Verbal cues required;Returned demonstration;Tactile cues required;Need further instruction       PT Short Term Goals - 05/04/19 1708      PT SHORT TERM GOAL #1   Title  Patient will be independent with HEP, updated PRN, to improve functional strength and activity tolerance.    Time  2    Period  Weeks    Status  On-going    Target Date  05/13/19      PT SHORT TERM GOAL #2   Title  Patient will demonstrate full pain free motion with lumbar AROM indicating reduced sensitivity to movement to return to prior activities.    Time  2    Period  Weeks    Status  On-going        PT Long Term Goals - 05/04/19 1708      PT  LONG TERM GOAL #1   Title  Patient will demonstrate proper squat form with no pain to demonstrate improve mobility to return to work duties and home activities such as laundray and cleaning.    Time  4    Period  Weeks    Status  On-going      PT LONG TERM GOAL #2   Title  Patient will demonstrate 1 grade improvement in MMT for bil LE's for improved functional LE strength to be able to perform work duties for full 8 hour shifts.    Time  4    Period  Weeks    Status  On-going      PT LONG TERM GOAL #3   Title  Patient will achieve 20 seconds for SLS on bil LE's to indicate improved balance to be able to safely negotiate stairs with step over step pattern.    Time  4    Period  Weeks    Status  On-going            Plan - 05/04/19 1720    Clinical Impression Statement  Reviewed goals and initiated therex according to evaluation findings.  Given HEP including leg lifts and bridges.  Manual completed to Lt hip in sidelying following 5 minutes of moist heat. Noted slightly raised protrusion on Lt hip that is not present on Rt hip.  Pt did not display any pain behaviors or have complaints with any exercise or with manual.  Reported pain reduced to 6/10 at conclusion of session.    Personal Factors and Comorbidities  Age;Comorbidity 1    Comorbidities  HTN    Examination-Activity Limitations  Squat;Stand;Sit;Reach Overhead;Stairs    Examination-Participation Restrictions  Cleaning;Community Activity;Laundry;Yard Work;Other    Stability/Clinical Decision Making  Stable/Uncomplicated    Rehab Potential  Good    PT Frequency  2x / week    PT Duration  4 weeks    PT Treatment/Interventions  ADLs/Self Care Home Management;Cryotherapy;Electrical Stimulation;Iontophoresis 4mg /ml Dexamethasone;Moist Heat;Functional mobility training;Therapeutic activities;Therapeutic exercise;Balance training;Neuromuscular re-education;Patient/family education;Manual techniques;Passive range of motion;Joint  Manipulations;Spinal Manipulations    PT Next Visit Plan  Follow up on HEP next session and progress therex, adding nustep, functional strengthening and balance.      PT Home Exercise Plan  SLR, bridge, sidelying hip abduction, prone hip extension    Consulted and Agree with Plan of Care  Patient       Patient will benefit from skilled therapeutic intervention in order to improve the following deficits and impairments:  Decreased activity tolerance, Decreased balance, Decreased mobility, Increased muscle spasms, Impaired flexibility, Pain, Decreased strength, Decreased endurance, Impaired perceived functional ability, Increased fascial restricitons  Visit Diagnosis: Left-sided low back pain without sciatica, unspecified chronicity  Muscle weakness (generalized)     Problem List Patient Active Problem List   Diagnosis Date Noted  . Regurgitation and rechewing   . Dysphagia   . GERD (gastroesophageal reflux disease) 11/03/2013  . HTN (hypertension) 11/03/2013  . Hyperlipemia 11/03/2013  . Vomiting 11/03/2013   Teena Irani, PTA/CLT [phone]  Teena Irani 05/04/2019, 5:40 PM  [place] [address] [place], [place], [address] Phone: [phone]   Fax:  [phone]  Name: Dawn Stafford MRN: [id number] Date of Birth: [birth year]

## 2019-05-05 ENCOUNTER — Other Ambulatory Visit: Payer: Self-pay

## 2019-05-05 ENCOUNTER — Telehealth: Payer: Self-pay | Admitting: Gastroenterology

## 2019-05-05 DIAGNOSIS — R110 Nausea: Secondary | ICD-10-CM

## 2019-05-05 NOTE — Telephone Encounter (Signed)
Pt called wanting to know if she can resched her MRI that was cx due the covid-19 on 03/13/2019? Please gv pt call to advised.

## 2019-05-05 NOTE — Telephone Encounter (Signed)
Patient scheduled for MRI of Brain w/wo contrast @ [facility] 05/21/19 patient to arrive at 12:45pm. Patient called with appt.

## 2019-05-05 NOTE — Telephone Encounter (Signed)
I called Radiology at the hospital to see if they are back to  scheduling MRIs, and they are. Please advise.

## 2019-05-05 NOTE — Telephone Encounter (Signed)
Yes that is fine. MRI brain with contrast, for chronic unexplained nausea. Thanks

## 2019-05-06 ENCOUNTER — Other Ambulatory Visit: Payer: Self-pay

## 2019-05-06 ENCOUNTER — Ambulatory Visit (HOSPITAL_COMMUNITY): Payer: PRIVATE HEALTH INSURANCE

## 2019-05-06 ENCOUNTER — Encounter (HOSPITAL_COMMUNITY): Payer: Self-pay

## 2019-05-06 DIAGNOSIS — M6281 Muscle weakness (generalized): Secondary | ICD-10-CM

## 2019-05-06 DIAGNOSIS — M545 Low back pain, unspecified: Secondary | ICD-10-CM

## 2019-05-06 NOTE — Therapy (Addendum)
[place], [place], [address] Phone: [phone]   Fax:  [phone]  Physical Therapy Treatment  Patient Details  Name: Dawn Stafford MRN: [id number] Date of Birth: [birth year] Referring Provider (PT): Renette Butters, MD   Encounter Date: 05/06/2019  PT End of Session - 05/06/19 1708    Visit Number  3    Number of Visits  9    Date for PT Re-Evaluation  05/27/19    Authorization Type  [employer] (1-eval and 8 visits approved)    Authorization Time Period  04/29/19-05/29/19    Authorization - Visit Number  3    Authorization - Number of Visits  9    PT Start Time  4196    PT Stop Time  1730    PT Time Calculation (min)  44 min    Activity Tolerance  Patient tolerated treatment well    Behavior During Therapy  [facility] for tasks assessed/performed       Past Medical History:  Diagnosis Date  . Acid reflux 11/07/2010  . GERD (gastroesophageal reflux disease)   . Hyperlipidemia   . Hypertension 11/07/2010    Past Surgical History:  Procedure Laterality Date  . [place] STUDY N/A 03/03/2018   Procedure: 24 HOUR PH STUDY (on PPI);  Surgeon: Mauri Pole, MD;  Location: [facility];  Service: Endoscopy;  Laterality: N/A;  . BRAVO [place] STUDY  01/10/2011   [name]  . COLONOSCOPY     04/2013  . ESOPHAGEAL MANOMETRY N/A 03/03/2018   Procedure: ESOPHAGEAL MANOMETRY (EM);  Surgeon: Mauri Pole, MD;  Location: [facility];  Service: Endoscopy;  Laterality: N/A;  . ESOPHAGOGASTRODUODENOSCOPY  10/2010  . [place] IMPEDANCE STUDY N/A 03/03/2018   Procedure: PH IMPEDANCE STUDY (on PPI);  Surgeon: Mauri Pole, MD;  Location: [facility];  Service: Endoscopy;  Laterality: N/A;  . TUBAL LIGATION      There were no vitals filed for this visit.  Subjective Assessment - 05/06/19 1646    Subjective  Patient reports she is doing alright today, she has about 8/10 pain today and states she has been  sore. She reports she is trying to do her exercises every day.     Limitations  House hold activities;Lifting;Standing;Walking    Patient Stated Goals  to have no pain with household and work activities    Currently in Pain?  Yes    Pain Score  8     Pain Location  Hip    Pain Orientation  Left    Pain Descriptors / Indicators  Aching    Pain Type  Acute pain   sub-acute       [facility] Adult PT Treatment/Exercise - 05/06/19 0001      Knee/Hip Exercises: Stretches   Other Knee/Hip Stretches  Cross leg stretch for Lt hip, 2x 30 seconds (in long sitting)      Knee/Hip Exercises: Standing   Forward Step Up  Both;1 set;10 reps;Hand Hold: 2;Step Height: 4"      Knee/Hip Exercises: Seated   Sit to Sand  2 sets;10 reps;without UE support   from standard chair     Knee/Hip Exercises: Supine   Bridges  Strengthening;Both;2 sets;10 reps    Bridges Limitations  2 sets    Straight Leg Raises  Both;2 sets;10 reps      Knee/Hip Exercises: Sidelying   Clams  Bil LE, 1x 10 reps, 3 sec holds  Knee/Hip Exercises: Prone   Hamstring Curl  1 set;10 reps;3 seconds    Hamstring Curl Limitations  bil LE    Hip Extension  Both;10 reps;1 set      Modalities   Modalities  Moist Heat      Moist Heat Therapy   Number Minutes Moist Heat  5 Minutes    Moist Heat Location  Hip   Lt hip Rt sidlying     Manual Therapy   Manual Therapy  Soft tissue mobilization    Manual therapy comments  performed seperate from other interventions    Soft tissue mobilization  Lt hip to glute med with massage roller        PT Education - 05/06/19 1739    Education Details  Patient instructed in stretch for additional HEP.    Person(s) Educated  Patient    Methods  Explanation;Handout;Demonstration    Comprehension  Verbalized understanding;Returned demonstration       PT Short Term Goals - 05/04/19 1708      PT SHORT TERM GOAL #1   Title  Patient will be independent with HEP, updated PRN, to improve  functional strength and activity tolerance.    Time  2    Period  Weeks    Status  On-going    Target Date  05/13/19      PT SHORT TERM GOAL #2   Title  Patient will demonstrate full pain free motion with lumbar AROM indicating reduced sensitivity to movement to return to prior activities.    Time  2    Period  Weeks    Status  On-going        PT Long Term Goals - 05/04/19 1708      PT LONG TERM GOAL #1   Title  Patient will demonstrate proper squat form with no pain to demonstrate improve mobility to return to work duties and home activities such as laundray and cleaning.    Time  4    Period  Weeks    Status  On-going      PT LONG TERM GOAL #2   Title  Patient will demonstrate 1 grade improvement in MMT for bil LE's for improved functional LE strength to be able to perform work duties for full 8 hour shifts.    Time  4    Period  Weeks    Status  On-going      PT LONG TERM GOAL #3   Title  Patient will achieve 20 seconds for SLS on bil LE's to indicate improved balance to be able to safely negotiate stairs with step over step pattern.    Time  4    Period  Weeks    Status  On-going        Plan - 05/06/19 1704    Clinical Impression Statement  Therapy session continues to focus on hip strengthening and patient was able to increase repetitions with no difficulty. Patient initiated step ups today with bil UE support in // bars. She required cues to follow step pattern for exercise but improved with repetition. EOS provided moist heat to hip for pain relief followed by soft tissue mobilization and educated patient on stretch for hip. She will continue to benefit from skilled PT interventions to address impairments to reduce pain and return to PLOF.    Personal Factors and Comorbidities  Age;Comorbidity 1    Comorbidities  HTN    Examination-Activity Limitations  Squat;Stand;Sit;Reach Overhead;Stairs    Examination-Participation Restrictions  Cleaning;Community  Activity;Laundry;Yard Work;Other    Stability/Clinical Decision Making  Stable/Uncomplicated    Rehab Potential  Good    PT Frequency  2x / week    PT Duration  4 weeks    PT Treatment/Interventions  ADLs/Self Care Home Management;Cryotherapy;Electrical Stimulation;Iontophoresis 4mg /ml Dexamethasone;Moist Heat;Functional mobility training;Therapeutic activities;Therapeutic exercise;Balance training;Neuromuscular re-education;Patient/family education;Manual techniques;Passive range of motion;Joint Manipulations;Spinal Manipulations    PT Next Visit Plan  Follow up on HEP next session and progress therex, adding nustep, functional strengthening and balance.      PT Home Exercise Plan  SLR, bridge, sidelying hip abduction, prone hip extension    Consulted and Agree with Plan of Care  Patient       Patient will benefit from skilled therapeutic intervention in order to improve the following deficits and impairments:  Decreased activity tolerance, Decreased balance, Decreased mobility, Increased muscle spasms, Impaired flexibility, Pain, Decreased strength, Decreased endurance, Impaired perceived functional ability, Increased fascial restricitons  Visit Diagnosis: Left-sided low back pain without sciatica, unspecified chronicity  Muscle weakness (generalized)     Problem List Patient Active Problem List   Diagnosis Date Noted  . Regurgitation and rechewing   . Dysphagia   . GERD (gastroesophageal reflux disease) 11/03/2013  . HTN (hypertension) 11/03/2013  . Hyperlipemia 11/03/2013  . Vomiting 11/03/2013    Kipp Brood, PT, DPT, [facility] Physical Therapist with [facility]  05/06/2019 5:40 PM    [place], [place], [address] Phone: [phone]   Fax:  [phone]  Name: Dawn Stafford MRN: [id number] Date of Birth: 12-Apr-1956

## 2019-05-12 ENCOUNTER — Encounter (HOSPITAL_COMMUNITY): Payer: Self-pay

## 2019-05-12 ENCOUNTER — Ambulatory Visit (HOSPITAL_COMMUNITY): Payer: PRIVATE HEALTH INSURANCE

## 2019-05-12 ENCOUNTER — Other Ambulatory Visit: Payer: Self-pay

## 2019-05-12 DIAGNOSIS — M545 Low back pain, unspecified: Secondary | ICD-10-CM

## 2019-05-12 DIAGNOSIS — M6281 Muscle weakness (generalized): Secondary | ICD-10-CM | POA: Diagnosis not present

## 2019-05-12 NOTE — Patient Instructions (Signed)
Supine Bridge with Resistance Band reps: 10-15 sets: 3 daily: 1 weekly: 7   Exercise image step 1   Exercise image step 2  Setup  Begin lying on your back with your arms laying at your sides, your legs bent at the knees and your feet flat on the ground, with a resistance band secured around your legs. Movement  Maintaining tension in the resistance band, tighten your abdominals and slowly lift your hips off the floor into a bridge position, keeping your back straight. Tip  Make sure to keep your trunk stiff throughout the exercise and your arms flat on the floor. Clamshell with Resistance reps: 10-15 sets: 3 daily: 1 weekly: 7   Exercise image step 1   Exercise image step 2  Setup  Begin by lying on your side with your knees bent 90 degrees, hips and shoulders stacked, and a resistance loop secured around your legs.  Movement  Raise your top knee away from the bottom one, then slowly return to the starting position.  Tip  Make sure not to roll your hips forward or backward during the exercise.

## 2019-05-12 NOTE — Therapy (Signed)
[place], [place], [address] Phone: [phone]   Fax:  [phone]  Physical Therapy Treatment  Patient Details  Name: Dawn Stafford MRN: [id number] Date of Birth: 14-Jan-1956 Referring Provider (PT): Renette Butters, MD   Encounter Date: 05/12/2019  PT End of Session - 05/12/19 1608    Visit Number  4    Number of Visits  9    Date for PT Re-Evaluation  05/27/19    Authorization Type  [employer] (1-eval and 8 visits approved)    Authorization Time Period  04/29/19-05/29/19    Authorization - Visit Number  4    Authorization - Number of Visits  9    PT Start Time  1600    PT Stop Time  1640    PT Time Calculation (min)  40 min    Activity Tolerance  Patient tolerated treatment well    Behavior During Therapy  [facility] for tasks assessed/performed       Past Medical History:  Diagnosis Date  . Acid reflux 11/07/2010  . GERD (gastroesophageal reflux disease)   . Hyperlipidemia   . Hypertension 11/07/2010    Past Surgical History:  Procedure Laterality Date  . [place] STUDY N/A 03/03/2018   Procedure: 24 HOUR PH STUDY (on PPI);  Surgeon: Mauri Pole, MD;  Location: [facility];  Service: Endoscopy;  Laterality: N/A;  . BRAVO [place] STUDY  01/10/2011   [name]  . COLONOSCOPY     04/2013  . ESOPHAGEAL MANOMETRY N/A 03/03/2018   Procedure: ESOPHAGEAL MANOMETRY (EM);  Surgeon: Mauri Pole, MD;  Location: [facility];  Service: Endoscopy;  Laterality: N/A;  . ESOPHAGOGASTRODUODENOSCOPY  10/2010  . [place] IMPEDANCE STUDY N/A 03/03/2018   Procedure: PH IMPEDANCE STUDY (on PPI);  Surgeon: Mauri Pole, MD;  Location: [facility];  Service: Endoscopy;  Laterality: N/A;  . TUBAL LIGATION      There were no vitals filed for this visit.  Subjective Assessment - 05/12/19 1559    Subjective  Pt reports she is doing well today and and reports she had some pain earlier today but denies any at  this time. She reports she took some tylenol or alieve and that seems to help. She states she is doing her HEP daily and that some days it feels easier than others.     Limitations  House hold activities;Lifting;Standing;Walking    Patient Stated Goals  to have no pain with household and work activities    Currently in Pain?  No/denies       [facility] Adult PT Treatment/Exercise - 05/12/19 0001      Exercises   Exercises  Knee/Hip      Knee/Hip Exercises: Stretches   Other Knee/Hip Stretches  Cross leg stretch for Lt hip, 2x 30 seconds (in long sitting)      Knee/Hip Exercises: Standing   Lateral Step Up  Both;1 set;15 reps;Hand Hold: 0;Step Height: 4"    Forward Step Up  Both;1 set;15 reps;Hand Hold: 0;Step Height: 4"    SLS with Vectors  3 way-vector: 5 sec holds each way (5 reps bil LE)      Knee/Hip Exercises: Seated   Sit to Sand  2 sets;10 reps;without UE support      Knee/Hip Exercises: Supine   Bridges with Clamshell  Both;2 sets;15 reps;Limitations   red TB for hip abd   Straight Leg Raises  Both;1 set;15 reps  Knee/Hip Exercises: Sidelying   Clams  Bil LE, 2x 15 reps, 3 sec holds, red theraband      Knee/Hip Exercises: Prone   Hip Extension  Both;1 set;10 reps       PT Education - 05/12/19 1608    Education Details  Educated on exercises throughout and updated HEP    Person(s) Educated  Patient    Methods  Explanation;Handout    Comprehension  Verbalized understanding;Returned demonstration       PT Short Term Goals - 05/04/19 1708      PT SHORT TERM GOAL #1   Title  Patient will be independent with HEP, updated PRN, to improve functional strength and activity tolerance.    Time  2    Period  Weeks    Status  On-going    Target Date  05/13/19      PT SHORT TERM GOAL #2   Title  Patient will demonstrate full pain free motion with lumbar AROM indicating reduced sensitivity to movement to return to prior activities.    Time  2    Period  Weeks    Status   On-going        PT Long Term Goals - 05/04/19 1708      PT LONG TERM GOAL #1   Title  Patient will demonstrate proper squat form with no pain to demonstrate improve mobility to return to work duties and home activities such as laundray and cleaning.    Time  4    Period  Weeks    Status  On-going      PT LONG TERM GOAL #2   Title  Patient will demonstrate 1 grade improvement in MMT for bil LE's for improved functional LE strength to be able to perform work duties for full 8 hour shifts.    Time  4    Period  Weeks    Status  On-going      PT LONG TERM GOAL #3   Title  Patient will achieve 20 seconds for SLS on bil LE's to indicate improved balance to be able to safely negotiate stairs with step over step pattern.    Time  4    Period  Weeks    Status  On-going        Plan - 05/12/19 1609    Clinical Impression Statement  Session began with standing functional strengthening and patient advanced step ups to no UE assist for balance. She continues to require cues for sequencing step pattern. Balance training initiated today with SLS and vectors, pt had greater difficulty with Rt SLS. She was able to progress mat exercises with red theraband and complained of fatigue with clamshell but was able to complete 2 full sets on both sides. Continued with hip strengthening and stretches at end of session and patient demonstrated good form. She will continue to benefit from skilled PT interventions to improve strength and balance in order to return to PLOF.    Personal Factors and Comorbidities  Age;Comorbidity 1    Comorbidities  HTN    Examination-Activity Limitations  Squat;Stand;Sit;Reach Overhead;Stairs    Examination-Participation Restrictions  Cleaning;Community Activity;Laundry;Yard Work;Other    Stability/Clinical Decision Making  Stable/Uncomplicated    Rehab Potential  Good    PT Frequency  2x / week    PT Duration  4 weeks    PT Treatment/Interventions  ADLs/Self Care Home  Management;Cryotherapy;Electrical Stimulation;Iontophoresis 4mg /ml Dexamethasone;Moist Heat;Functional mobility training;Therapeutic activities;Therapeutic exercise;Balance training;Neuromuscular re-education;Patient/family education;Manual techniques;Passive range of motion;Joint  Manipulations;Spinal Manipulations    PT Next Visit Plan  Add nustep. Continue functional strengthening and progress balance challenges    PT Home Exercise Plan  SLR, bridge, sidelying hip abduction, prone hip extension (05/12/19 - updated with theraband)    Consulted and Agree with Plan of Care  Patient       Patient will benefit from skilled therapeutic intervention in order to improve the following deficits and impairments:  Decreased activity tolerance, Decreased balance, Decreased mobility, Increased muscle spasms, Impaired flexibility, Pain, Decreased strength, Decreased endurance, Impaired perceived functional ability, Increased fascial restricitons  Visit Diagnosis: Left-sided low back pain without sciatica, unspecified chronicity  Muscle weakness (generalized)     Problem List Patient Active Problem List   Diagnosis Date Noted  . Regurgitation and rechewing   . Dysphagia   . GERD (gastroesophageal reflux disease) 11/03/2013  . HTN (hypertension) 11/03/2013  . Hyperlipemia 11/03/2013  . Vomiting 11/03/2013    Kipp Brood, PT, DPT, [facility] Physical Therapist with [facility]  05/12/2019 4:42 PM    [place] [address] [place], [place], [address] Phone: [phone]   Fax:  [phone]  Name: Dawn Stafford MRN: [id number] Date of Birth: August 13, 1956

## 2019-05-15 ENCOUNTER — Ambulatory Visit (HOSPITAL_COMMUNITY): Payer: PRIVATE HEALTH INSURANCE

## 2019-05-15 ENCOUNTER — Encounter (HOSPITAL_COMMUNITY): Payer: Self-pay

## 2019-05-15 ENCOUNTER — Other Ambulatory Visit: Payer: Self-pay

## 2019-05-15 DIAGNOSIS — M545 Low back pain, unspecified: Secondary | ICD-10-CM

## 2019-05-15 DIAGNOSIS — M6281 Muscle weakness (generalized): Secondary | ICD-10-CM

## 2019-05-15 NOTE — Therapy (Signed)
[place], [place], [address] Phone: [phone]   Fax:  [phone]  Physical Therapy Treatment  Patient Details  Name: Dawn Stafford MRN: [id number] Date of Birth: 1956/09/30 Referring Provider (PT): Renette Butters, MD   Encounter Date: 05/15/2019  PT End of Session - 05/15/19 1553    Visit Number  5    Number of Visits  9    Date for PT Re-Evaluation  05/27/19    Authorization Type  Workmans Comp (1-eval and 8 visits approved)    Authorization Time Period  04/29/19-05/29/19    Authorization - Visit Number  5    Authorization - Number of Visits  9    PT Start Time  1522    PT Stop Time  1601    PT Time Calculation (min)  39 min    Activity Tolerance  Patient tolerated treatment well    Behavior During Therapy  [facility] for tasks assessed/performed       Past Medical History:  Diagnosis Date  . Acid reflux 11/07/2010  . GERD (gastroesophageal reflux disease)   . Hyperlipidemia   . Hypertension 11/07/2010    Past Surgical History:  Procedure Laterality Date  . [place] STUDY N/A 03/03/2018   Procedure: 24 HOUR PH STUDY (on PPI);  Surgeon: Mauri Pole, MD;  Location: [facility];  Service: Endoscopy;  Laterality: N/A;  . BRAVO [place] STUDY  01/10/2011   [name]  . COLONOSCOPY     04/2013  . ESOPHAGEAL MANOMETRY N/A 03/03/2018   Procedure: ESOPHAGEAL MANOMETRY (EM);  Surgeon: Mauri Pole, MD;  [facility]: [facility];  Service: Endoscopy;  Laterality: N/A;  . ESOPHAGOGASTRODUODENOSCOPY  10/2010  . [place] IMPEDANCE STUDY N/A 03/03/2018   Procedure: PH IMPEDANCE STUDY (on PPI);  Surgeon: Mauri Pole, MD;  Location: [facility];  Service: Endoscopy;  Laterality: N/A;  . TUBAL LIGATION      There were no vitals filed for this visit.  Subjective Assessment - 05/15/19 1525    Subjective  Patietn reports she is doing well now. She had a little pain earlier in her hip and states it comes  and goes. She does not have any at start of session.    Limitations  House hold activities;Lifting;Standing;Walking    Patient Stated Goals  to have no pain with household and work activities    Currently in Pain?  No/denies       [facility] Adult PT Treatment/Exercise - 05/15/19 0001      Exercises   Exercises  Knee/Hip      Knee/Hip Exercises: Aerobic   Nustep  6 minutes, level 4, [place] program 2, seat 9      Knee/Hip Exercises: Standing   Lateral Step Up  Both;Hand Hold: 0;Step Height: 4";1 set;15 reps    Forward Step Up  Both;2 sets;10 reps;Hand Hold: 0;Step Height: 4"    Other Standing Knee Exercises  Tandem: 2x10 each way bil UE overhead flex with 3lbs bar      Knee/Hip Exercises: Supine   Straight Leg Raises  Both;15 reps;2 sets      Knee/Hip Exercises: Sidelying   Hip ABduction  Both;2 sets;10 reps    Hip ABduction Limitations  cues for form      Knee/Hip Exercises: Prone   Hip Extension  Both;1 set;15 reps;Limitations    Hip Extension Limitations  3 sec holds        PT Education - 05/15/19 1558  Education Details  Educated throughout on purpose of exercises and to continue with HEP.    Person(s) Educated  Patient    Methods  Explanation    Comprehension  Verbalized understanding       PT Short Term Goals - 05/04/19 1708      PT SHORT TERM GOAL #1   Title  Patient will be independent with HEP, updated PRN, to improve functional strength and activity tolerance.    Time  2    Period  Weeks    Status  On-going    Target Date  05/13/19      PT SHORT TERM GOAL #2   Title  Patient will demonstrate full pain free motion with lumbar AROM indicating reduced sensitivity to movement to return to prior activities.    Time  2    Period  Weeks    Status  On-going        PT Long Term Goals - 05/04/19 1708      PT LONG TERM GOAL #1   Title  Patient will demonstrate proper squat form with no pain to demonstrate improve mobility to return to work duties and home  activities such as laundray and cleaning.    Time  4    Period  Weeks    Status  On-going      PT LONG TERM GOAL #2   Title  Patient will demonstrate 1 grade improvement in MMT for bil LE's for improved functional LE strength to be able to perform work duties for full 8 hour shifts.    Time  4    Period  Weeks    Status  On-going      PT LONG TERM GOAL #3   Title  Patient will achieve 20 seconds for SLS on bil LE's to indicate improved balance to be able to safely negotiate stairs with step over step pattern.    Time  4    Period  Weeks    Status  On-going        Plan - 05/15/19 1558    Clinical Impression Statement  Ms. Dawn Stafford continues to progress exercises steadily. She advanced hip abductor strengthening to include side-lying abduction and required cues to maintain proper alinement and move through entire ROM. She increased repetitions for forward step ups with no difficulty and added tandem stance challenge with UE movement. NuStep was added today with hill program and level 4 resistance; pt required cues to maintain SPM above 45. She will benefit from skilled PT interventions to address impairments and progress towards PLOF.    Personal Factors and Comorbidities  Age;Comorbidity 1    Comorbidities  HTN    Examination-Activity Limitations  Squat;Stand;Sit;Reach Overhead;Stairs    Examination-Participation Restrictions  Cleaning;Community Activity;Laundry;Yard Work;Other    Stability/Clinical Decision Making  Stable/Uncomplicated    Rehab Potential  Good    PT Frequency  2x / week    PT Duration  4 weeks    PT Treatment/Interventions  ADLs/Self Care Home Management;Cryotherapy;Electrical Stimulation;Iontophoresis 4mg /ml Dexamethasone;Moist Heat;Functional mobility training;Therapeutic activities;Therapeutic exercise;Balance training;Neuromuscular re-education;Patient/family education;Manual techniques;Passive range of motion;Joint Manipulations;Spinal Manipulations    PT Next Visit  Plan  Update HEP next session. Continue functional strengthening and progress balance challenges    PT Home Exercise Plan  SLR, bridge, sidelying hip abduction, prone hip extension (05/12/19 - updated with theraband)    Consulted and Agree with Plan of Care  Patient       Patient will benefit from skilled therapeutic intervention in  order to improve the following deficits and impairments:  Decreased activity tolerance, Decreased balance, Decreased mobility, Increased muscle spasms, Impaired flexibility, Pain, Decreased strength, Decreased endurance, Impaired perceived functional ability, Increased fascial restricitons  Visit Diagnosis: Left-sided low back pain without sciatica, unspecified chronicity  Muscle weakness (generalized)     Problem List Patient Active Problem List   Diagnosis Date Noted  . Regurgitation and rechewing   . Dysphagia   . GERD (gastroesophageal reflux disease) 11/03/2013  . HTN (hypertension) 11/03/2013  . Hyperlipemia 11/03/2013  . Vomiting 11/03/2013    Kipp Brood, PT, DPT, [facility] Physical Therapist with [facility]  05/15/2019 4:14 PM    [place] [address] [place], [place], [address] Phone: [phone]   Fax:  [phone]  Name: Dawn Stafford MRN: [id number] Date of Birth: [birth year]

## 2019-05-18 ENCOUNTER — Other Ambulatory Visit: Payer: Self-pay

## 2019-05-18 ENCOUNTER — Ambulatory Visit (HOSPITAL_COMMUNITY): Payer: PRIVATE HEALTH INSURANCE | Attending: Internal Medicine

## 2019-05-18 ENCOUNTER — Encounter (HOSPITAL_COMMUNITY): Payer: Self-pay

## 2019-05-18 DIAGNOSIS — M545 Low back pain, unspecified: Secondary | ICD-10-CM

## 2019-05-18 DIAGNOSIS — M6281 Muscle weakness (generalized): Secondary | ICD-10-CM | POA: Diagnosis not present

## 2019-05-18 NOTE — Therapy (Signed)
[place], [place], [address] Phone: [phone]   Fax:  [phone]  Physical Therapy Treatment  Patient Details  Name: Dawn Stafford MRN: [id number] Date of Birth: 1956/12/14 Referring Provider (PT): Renette Butters, MD   Encounter Date: 05/18/2019  PT End of Session - 05/18/19 1707    Visit Number  6    Number of Visits  9    Date for PT Re-Evaluation  05/27/19    Authorization Type  Workmans Comp (1-eval and 8 visits approved)    Authorization Time Period  04/29/19-05/29/19    Authorization - Visit Number  6    Authorization - Number of Visits  9    PT Start Time  2725    PT Stop Time  1701    PT Time Calculation (min)  44 min    Activity Tolerance  Patient tolerated treatment well    Behavior During Therapy  [facility] for tasks assessed/performed       Past Medical History:  Diagnosis Date  . Acid reflux 11/07/2010  . GERD (gastroesophageal reflux disease)   . Hyperlipidemia   . Hypertension 11/07/2010    Past Surgical History:  Procedure Laterality Date  . [place] STUDY N/A 03/03/2018   Procedure: 24 HOUR PH STUDY (on PPI);  Surgeon: Mauri Pole, MD;  Location: [facility];  Service: Endoscopy;  Laterality: N/A;  . BRAVO [place] STUDY  01/10/2011   [name]  . COLONOSCOPY     04/2013  . ESOPHAGEAL MANOMETRY N/A 03/03/2018   Procedure: ESOPHAGEAL MANOMETRY (EM);  Surgeon: Mauri Pole, MD;  Location: [facility];  Service: Endoscopy;  Laterality: N/A;  . ESOPHAGOGASTRODUODENOSCOPY  10/2010  . [place] IMPEDANCE STUDY N/A 03/03/2018   Procedure: PH IMPEDANCE STUDY (on PPI);  Surgeon: Mauri Pole, MD;  Location: [facility];  Service: Endoscopy;  Laterality: N/A;  . TUBAL LIGATION      There were no vitals filed for this visit.  Subjective Assessment - 05/18/19 1621    Subjective  Patient reports Lt hip pain that started this morning. She is not sure what may have aggravated it  and denies any increase in activity this weekend and states she has been doing her exercises regularly.    Limitations  House hold activities;Lifting;Standing;Walking    Patient Stated Goals  to have no pain with household and work activities    Currently in Pain?  Yes    Pain Score  7     Pain Location  Hip    Pain Orientation  Left    Pain Descriptors / Indicators  Aching    Pain Type  Acute pain    Pain Onset  Today    Pain Frequency  Intermittent    Aggravating Factors   unsure    Pain Relieving Factors  unsure        [facility] Adult PT Treatment/Exercise - 05/18/19 0001      Exercises   Exercises  Knee/Hip      Knee/Hip Exercises: Standing   Functional Squat  2 sets;10 reps    Functional Squat Limitations  squat taps to mat table    Stairs  5x ascend/descend 6x4" stairs with UE support at start and transitioned to no hand rail support. 4x ascen/descend 4x6" stairs with 1 hand rail support    SLS with Vectors  3 way-vector: 5 sec holds each way (5 reps bil LE)   no UE  Other Standing Knee Exercises  Tandem: 2x10 each way (paloff press with red theraband from Rt/Lt side)      Knee/Hip Exercises: Supine   Bridges  Strengthening;Both;2 sets;15 reps      Knee/Hip Exercises: Prone   Hip Extension  Both;2 sets;15 reps    Hip Extension Limitations  3 sec holds      Manual Therapy   Manual Therapy  Soft tissue mobilization    Manual therapy comments  performed seperate from other interventions    Soft tissue mobilization  Lt hip to glute med with massage roller        PT Education - 05/18/19 1714    Education Details  Educated on updated HEP with minisquats at chair and SLS with vector. Print hanodout for patient and give next visit.    Person(s) Educated  Patient    Methods  Explanation;Demonstration    Comprehension  Verbalized understanding;Returned demonstration       PT Short Term Goals - 05/04/19 1708      PT SHORT TERM GOAL #1   Title  Patient will be  independent with HEP, updated PRN, to improve functional strength and activity tolerance.    Time  2    Period  Weeks    Status  On-going    Target Date  05/13/19      PT SHORT TERM GOAL #2   Title  Patient will demonstrate full pain free motion with lumbar AROM indicating reduced sensitivity to movement to return to prior activities.    Time  2    Period  Weeks    Status  On-going        PT Long Term Goals - 05/04/19 1708      PT LONG TERM GOAL #1   Title  Patient will demonstrate proper squat form with no pain to demonstrate improve mobility to return to work duties and home activities such as laundray and cleaning.    Time  4    Period  Weeks    Status  On-going      PT LONG TERM GOAL #2   Title  Patient will demonstrate 1 grade improvement in MMT for bil LE's for improved functional LE strength to be able to perform work duties for full 8 hour shifts.    Time  4    Period  Weeks    Status  On-going      PT LONG TERM GOAL #3   Title  Patient will achieve 20 seconds for SLS on bil LE's to indicate improved balance to be able to safely negotiate stairs with step over step pattern.    Time  4    Period  Weeks    Status  On-going        Plan - 05/18/19 1708    Clinical Impression Statement  Patient continued to progress functional exercises this session. She initiated squats and required cues for proper trunk posture to prevent excessive trunk flexion. She was able to demonstrate improved form with mini-squat and this was added to her HEP. She continued with SLS and vectors and did not require any UE support throughout. Stair training also initiated today and patient required moderate cuing to achieve proper step sequencing with alternating stair pattern. She demonstrated steady stepping on 4" stairs with no UE support after repetition but required 1 handrail for safety on 6" stairs to ascend/descend with alternating stair pattern. At EOS performed soft tissue mobilization to Lt  hip and patient reported  reduced pain. She will benefit from skilled PT interventions to address impairments and progress towards PLOF.    Personal Factors and Comorbidities  Age;Comorbidity 1    Comorbidities  HTN    Examination-Activity Limitations  Squat;Stand;Sit;Reach Overhead;Stairs    Examination-Participation Restrictions  Cleaning;Community Activity;Laundry;Yard Work;Other    Stability/Clinical Decision Making  Stable/Uncomplicated    Rehab Potential  Good    PT Frequency  2x / week    PT Duration  4 weeks    PT Treatment/Interventions  ADLs/Self Care Home Management;Cryotherapy;Electrical Stimulation;Iontophoresis 4mg /ml Dexamethasone;Moist Heat;Functional mobility training;Therapeutic activities;Therapeutic exercise;Balance training;Neuromuscular re-education;Patient/family education;Manual techniques;Passive range of motion;Joint Manipulations;Spinal Manipulations    PT Next Visit Plan  Update HEP next session. Continue functional strengthening and progress balance challenges    PT Home Exercise Plan  SLR, bridge, sidelying hip abduction, prone hip extension (05/12/19 - updated with theraband); 05/18/19 - minisquat, SLS with vecors    Consulted and Agree with Plan of Care  Patient       Patient will benefit from skilled therapeutic intervention in order to improve the following deficits and impairments:  Decreased activity tolerance, Decreased balance, Decreased mobility, Increased muscle spasms, Impaired flexibility, Pain, Decreased strength, Decreased endurance, Impaired perceived functional ability, Increased fascial restricitons  Visit Diagnosis: Left-sided low back pain without sciatica, unspecified chronicity  Muscle weakness (generalized)     Problem List Patient Active Problem List   Diagnosis Date Noted  . Regurgitation and rechewing   . Dysphagia   . GERD (gastroesophageal reflux disease) 11/03/2013  . HTN (hypertension) 11/03/2013  . Hyperlipemia 11/03/2013  .  Vomiting 11/03/2013    Kipp Brood, PT, DPT, [facility] Physical Therapist with [facility]  05/18/2019 5:21 PM    [place] [address] [place], [place], [address] Phone: [phone]   Fax:  [phone]  Name: KASANDRA FEHR MRN: [id number] Date of Birth: 01-Jun-1956

## 2019-05-21 ENCOUNTER — Other Ambulatory Visit: Payer: Self-pay

## 2019-05-21 ENCOUNTER — Ambulatory Visit (HOSPITAL_COMMUNITY)
Admission: RE | Admit: 2019-05-21 | Discharge: 2019-05-21 | Disposition: A | Discharge status: Home / Self Care | Payer: 59 | Source: Ambulatory Visit | Attending: Gastroenterology | Admitting: Gastroenterology

## 2019-05-21 DIAGNOSIS — R110 Nausea: Secondary | ICD-10-CM | POA: Diagnosis not present

## 2019-05-21 LAB — CREATININE, SERUM
Creatinine, Ser: 0.74 mg/dL (ref 0.44–1.00)
GFR calc Af Amer: >60 mL/min (ref >60)
GFR calc non Af Amer: >60 mL/min (ref >60)

## 2019-05-21 MED ORDER — GADOBUTROL 1 MMOL/ML IV SOLN
6.0000 mL | Freq: Once | INTRAVENOUS | Status: AC | PRN
  Administered 2019-05-21: 15:00:00 6 mL via INTRAVENOUS

## 2019-05-22 ENCOUNTER — Encounter (HOSPITAL_COMMUNITY): Payer: Self-pay

## 2019-05-22 ENCOUNTER — Telehealth (HOSPITAL_COMMUNITY): Payer: Self-pay

## 2019-05-22 ENCOUNTER — Ambulatory Visit (HOSPITAL_COMMUNITY): Payer: PRIVATE HEALTH INSURANCE

## 2019-05-22 DIAGNOSIS — M545 Low back pain, unspecified: Secondary | ICD-10-CM

## 2019-05-22 DIAGNOSIS — M6281 Muscle weakness (generalized): Secondary | ICD-10-CM

## 2019-05-22 MED FILL — MELOXICAM 7.5 MG TABLET: 7.5 | 30 days supply | Qty: 30 | Fill #0

## 2019-05-22 MED FILL — SIMVASTATIN 20 MG TABLET: 20 | 90 days supply | Qty: 90 | Fill #0

## 2019-05-22 NOTE — Therapy (Signed)
[place], [place], [address] Phone: [phone]   Fax:  [phone]  Physical Therapy Treatment  Patient Details  Name: Dawn Stafford MRN: [id number] Date of Birth: 1956-10-04 Referring Provider (PT): Renette Butters, MD   Encounter Date: 05/22/2019  PT End of Session - 05/22/19 1621    Visit Number  7    Number of Visits  9    Date for PT Re-Evaluation  05/27/19    Authorization Type  Workmans Comp (1-eval and 8 visits approved)    Authorization Time Period  04/29/19-05/29/19    Authorization - Visit Number  7    Authorization - Number of Visits  9    PT Start Time  6389   pt late for apt   PT Stop Time  1650    PT Time Calculation (min)  34 min    Activity Tolerance  Patient tolerated treatment well    Behavior During Therapy  [facility] for tasks assessed/performed       Past Medical History:  Diagnosis Date  . Acid reflux 11/07/2010  . GERD (gastroesophageal reflux disease)   . Hyperlipidemia   . Hypertension 11/07/2010    Past Surgical History:  Procedure Laterality Date  . [place] STUDY N/A 03/03/2018   Procedure: 24 HOUR PH STUDY (on PPI);  Surgeon: Mauri Pole, MD;  Location: [facility];  Service: Endoscopy;  Laterality: N/A;  . BRAVO [place] STUDY  01/10/2011   [name]  . COLONOSCOPY     04/2013  . ESOPHAGEAL MANOMETRY N/A 03/03/2018   Procedure: ESOPHAGEAL MANOMETRY (EM);  Surgeon: Mauri Pole, MD;  Location: [facility];  Service: Endoscopy;  Laterality: N/A;  . ESOPHAGOGASTRODUODENOSCOPY  10/2010  . [place] IMPEDANCE STUDY N/A 03/03/2018   Procedure: PH IMPEDANCE STUDY (on PPI);  Surgeon: Mauri Pole, MD;  Location: [facility];  Service: Endoscopy;  Laterality: N/A;  . TUBAL LIGATION      There were no vitals filed for this visit.  Subjective Assessment - 05/22/19 1614    Subjective  Pt reports she had increased pain this morning, current pain scale 5/10 Lt hip.   Reports compliance wiht HEP daily.      Patient Stated Goals  to have no pain with household and work activities    Currently in Pain?  Yes    Pain Score  5     Pain Location  Hip    Pain Orientation  Left    Pain Descriptors / Indicators  Aching;Throbbing    Pain Type  Acute pain    Pain Onset  More than a month ago    Pain Frequency  Intermittent    Aggravating Factors   unsure    Pain Relieving Factors  unsure                       [facility] Adult PT Treatment/Exercise - 05/22/19 0001      Knee/Hip Exercises: Stretches   Piriformis Stretch  1 rep;30 seconds    Piriformis Stretch Limitations  1 rep supine and seated      Knee/Hip Exercises: Standing   Functional Squat  2 sets;10 reps    Functional Squat Limitations  squat taps to mat table    Stairs  5x ascend/descend 7" stair height with UE support at start and transitioned to no hand rail support. 4x ascen/descend 4x6" stairs with 1 hand rail support  SLS  3 sets    SLS with Vectors  3 way-vector: 5 sec holds each way (5 reps bil LE)   5 reps each LE   Other Standing Knee Exercises  Tandem stance 2x 30" solid; 1x 20" each on foam      Manual Therapy   Manual Therapy  Soft tissue mobilization    Manual therapy comments  performed seperate from other interventions    Soft tissue mobilization  Lt hip to glute med with massage roller               PT Short Term Goals - 05/04/19 1708      PT SHORT TERM GOAL #1   Title  Patient will be independent with HEP, updated PRN, to improve functional strength and activity tolerance.    Time  2    Period  Weeks    Status  On-going    Target Date  05/13/19      PT SHORT TERM GOAL #2   Title  Patient will demonstrate full pain free motion with lumbar AROM indicating reduced sensitivity to movement to return to prior activities.    Time  2    Period  Weeks    Status  On-going        PT Long Term Goals - 05/04/19 1708      PT LONG TERM GOAL #1   Title   Patient will demonstrate proper squat form with no pain to demonstrate improve mobility to return to work duties and home activities such as laundray and cleaning.    Time  4    Period  Weeks    Status  On-going      PT LONG TERM GOAL #2   Title  Patient will demonstrate 1 grade improvement in MMT for bil LE's for improved functional LE strength to be able to perform work duties for full 8 hour shifts.    Time  4    Period  Weeks    Status  On-going      PT LONG TERM GOAL #3   Title  Patient will achieve 20 seconds for SLS on bil LE's to indicate improved balance to be able to safely negotiate stairs with step over step pattern.    Time  4    Period  Weeks    Status  On-going            Plan - 05/22/19 1657    Clinical Impression Statement  Continued progressing functional strengthening and balance training wiht min guard and cueing for focual point and posture through session.  Demonstration and verbal cueing to improve mechanics iwht squats to reduce strain on knees and flexion of trunk.  Continued stair training on 7" step height, pt very slow and cautious with activity.  Reviewed HEP compliance and exercise, encouraged pt to begin balance activities at home as well.  EOS with manual soft tissue mobilization to address restricitons wiht noted tight Bil Piriformis.  Added supine and seated piriformis stretch to address restrictions.    Personal Factors and Comorbidities  Age;Comorbidity 1    Comorbidities  HTN    Examination-Activity Limitations  Squat;Stand;Sit;Reach Overhead;Stairs    Examination-Participation Restrictions  Cleaning;Community Activity;Laundry;Yard Work;Other    Stability/Clinical Decision Making  Stable/Uncomplicated    Rehab Potential  Good    PT Frequency  2x / week    PT Duration  4 weeks    PT Treatment/Interventions  ADLs/Self Care Home Management;Cryotherapy;Electrical Stimulation;Iontophoresis 4mg /ml Dexamethasone;Moist Heat;Functional  mobility  training;Therapeutic activities;Therapeutic exercise;Balance training;Neuromuscular re-education;Patient/family education;Manual techniques;Passive range of motion;Joint Manipulations;Spinal Manipulations    PT Next Visit Plan  Continue functional strengthening and progress balance challenges    PT Home Exercise Plan  SLR, bridge, sidelying hip abduction, prone hip extension (05/12/19 - updated with theraband); 05/18/19 - minisquat, SLS with vecors; 05/22/19: tandem stance by sink       Patient will benefit from skilled therapeutic intervention in order to improve the following deficits and impairments:  Decreased activity tolerance, Decreased balance, Decreased mobility, Increased muscle spasms, Impaired flexibility, Pain, Decreased strength, Decreased endurance, Impaired perceived functional ability, Increased fascial restricitons  Visit Diagnosis: Left-sided low back pain without sciatica, unspecified chronicity  Muscle weakness (generalized)     Problem List Patient Active Problem List   Diagnosis Date Noted  . Regurgitation and rechewing   . Dysphagia   . GERD (gastroesophageal reflux disease) 11/03/2013  . HTN (hypertension) 11/03/2013  . Hyperlipemia 11/03/2013  . Vomiting 11/03/2013   Ihor Austin, [place]; [place]  Aldona Lento 05/22/2019, 5:04 PM  [place] [address] [place], [place], [address] Phone: [phone]   Fax:  [phone]  Name: ZELPHA MESSING MRN: [id number] Date of Birth: 1956-02-12

## 2019-05-22 NOTE — Telephone Encounter (Signed)
Ms. Dawn Stafford sent approval for 8 more PT Worker's Comp visit and requested the following progress notes  faxed  from 04/29/2019-05/18/2019 via [phone]

## 2019-05-25 ENCOUNTER — Ambulatory Visit (HOSPITAL_COMMUNITY): Payer: PRIVATE HEALTH INSURANCE

## 2019-05-25 ENCOUNTER — Other Ambulatory Visit: Payer: Self-pay

## 2019-05-25 ENCOUNTER — Encounter (HOSPITAL_COMMUNITY): Payer: Self-pay

## 2019-05-25 DIAGNOSIS — M545 Low back pain, unspecified: Secondary | ICD-10-CM

## 2019-05-25 DIAGNOSIS — M6281 Muscle weakness (generalized): Secondary | ICD-10-CM | POA: Diagnosis not present

## 2019-05-25 NOTE — Therapy (Signed)
[place], [place], [address] Phone: [phone]   Fax:  [phone]  Physical Therapy Treatment  Patient Details  Name: Dawn Stafford MRN: [id number] Date of Birth: [birth year] Referring Provider (PT): Renette Butters, MD   Encounter Date: 05/25/2019  PT End of Session - 05/25/19 1153    Visit Number  8    Number of Visits  9    Date for PT Re-Evaluation  05/27/19    Authorization Type  Workmans Comp (1-eval and 8 visits approved)    Authorization Time Period  04/29/19-05/29/19    Authorization - Visit Number  8    Authorization - Number of Visits  9    PT Start Time  1106    PT Stop Time  1148    PT Time Calculation (min)  42 min    Activity Tolerance  Patient tolerated treatment well    Behavior During Therapy  [facility] for tasks assessed/performed       Past Medical History:  Diagnosis Date  . Acid reflux 11/07/2010  . GERD (gastroesophageal reflux disease)   . Hyperlipidemia   . Hypertension 11/07/2010    Past Surgical History:  Procedure Laterality Date  . [place] STUDY N/A 03/03/2018   Procedure: 24 HOUR PH STUDY (on PPI);  Surgeon: Mauri Pole, MD;  Location: [facility];  Service: Endoscopy;  Laterality: N/A;  . BRAVO [place] STUDY  01/10/2011   [name]  . COLONOSCOPY     04/2013  . ESOPHAGEAL MANOMETRY N/A 03/03/2018   Procedure: ESOPHAGEAL MANOMETRY (EM);  Surgeon: Mauri Pole, MD;  Location: [facility];  Service: Endoscopy;  Laterality: N/A;  . ESOPHAGOGASTRODUODENOSCOPY  10/2010  . [place] IMPEDANCE STUDY N/A 03/03/2018   Procedure: PH IMPEDANCE STUDY (on PPI);  Surgeon: Mauri Pole, MD;  Location: [facility];  Service: Endoscopy;  Laterality: N/A;  . TUBAL LIGATION      There were no vitals filed for this visit.  Subjective Assessment - 05/25/19 1106    Subjective  Pt reports she felt better following last session.  Current pain scale 6/10 Lt hip pain.  Stated  balance most difficulty currently.      Patient Stated Goals  to have no pain with household and work activities    Currently in Pain?  Yes    Pain Score  6     Pain Location  Hip    Pain Orientation  Left    Pain Descriptors / Indicators  Dull    Pain Onset  More than a month ago    Pain Frequency  Intermittent    Aggravating Factors   unsure, constant movement    Pain Relieving Factors  unsure, sitting                       [facility] Adult PT Treatment/Exercise - 05/25/19 0001      Knee/Hip Exercises: Standing   Forward Lunges  Both;10 reps    Forward Lunges Limitations  Cueing for mechanics    Side Lunges  5 sets    Side Lunges Limitations  multimodal cueing to reduce lumbar side bend and improve LE movements    Functional Squat  2 sets;10 reps    Functional Squat Limitations  squat taps to mat table    Stairs  5x ascend/descend 7" stair height with UE support at start and transitioned to no hand rail support. 4x ascen/descend 4x6" stairs  with 1 hand rail support    SLS  3 sets Rt 17" Lt 20" tops    SLS with Vectors  3 way-vector: 5 sec holds each way (5 reps bil LE)    Other Standing Knee Exercises  Tandem stance on foam 2x 30", Pallof 10x 2 sets      Manual Therapy   Manual Therapy  Soft tissue mobilization    Manual therapy comments  performed seperate from other interventions    Soft tissue mobilization  Lt hip to glute med with massage roller               PT Short Term Goals - 05/04/19 1708      PT SHORT TERM GOAL #1   Title  Patient will be independent with HEP, updated PRN, to improve functional strength and activity tolerance.    Time  2    Period  Weeks    Status  On-going    Target Date  05/13/19      PT SHORT TERM GOAL #2   Title  Patient will demonstrate full pain free motion with lumbar AROM indicating reduced sensitivity to movement to return to prior activities.    Time  2    Period  Weeks    Status  On-going        PT Long  Term Goals - 05/04/19 1708      PT LONG TERM GOAL #1   Title  Patient will demonstrate proper squat form with no pain to demonstrate improve mobility to return to work duties and home activities such as laundray and cleaning.    Time  4    Period  Weeks    Status  On-going      PT LONG TERM GOAL #2   Title  Patient will demonstrate 1 grade improvement in MMT for bil LE's for improved functional LE strength to be able to perform work duties for full 8 hour shifts.    Time  4    Period  Weeks    Status  On-going      PT LONG TERM GOAL #3   Title  Patient will achieve 20 seconds for SLS on bil LE's to indicate improved balance to be able to safely negotiate stairs with step over step pattern.    Time  4    Period  Weeks    Status  On-going            Plan - 05/25/19 1154    Clinical Impression Statement  Session focus on functional strengthening and balance training.  Added foam with pallof with some difficulty, able to resolve all LOB episodes independenlty with therapist min guard.  Pt able to ascend/descend stair with reciprocal pattern this session with good control, continues to ambulate very slow and cautious with activity.  Added lunges for LE strengthening with moderate cueing to improve form.  EOS with manual soft tissue mobilitation to address restricitons on hip musculature for pain control.      Personal Factors and Comorbidities  Age;Comorbidity 1    Comorbidities  HTN    Examination-Activity Limitations  Squat;Stand;Sit;Reach Overhead;Stairs    Examination-Participation Restrictions  Cleaning;Community Activity;Laundry;Yard Work;Other    Stability/Clinical Decision Making  Stable/Uncomplicated    Rehab Potential  Good    PT Frequency  2x / week    PT Duration  4 weeks    PT Treatment/Interventions  ADLs/Self Care Home Management;Cryotherapy;Electrical Stimulation;Iontophoresis 4mg /ml Dexamethasone;Moist Heat;Functional mobility training;Therapeutic  activities;Therapeutic exercise;Balance training;Neuromuscular  re-education;Patient/family education;Manual techniques;Passive range of motion;Joint Manipulations;Spinal Manipulations    PT Next Visit Plan  Reassess next session.  Continue functional strengthening and progress balance challanges.    PT Home Exercise Plan  SLR, bridge, sidelying hip abduction, prone hip extension (05/12/19 - updated with theraband); 05/18/19 - minisquat, SLS with vecors; 05/22/19: tandem stance by sink       Patient will benefit from skilled therapeutic intervention in order to improve the following deficits and impairments:  Decreased activity tolerance, Decreased balance, Decreased mobility, Increased muscle spasms, Impaired flexibility, Pain, Decreased strength, Decreased endurance, Impaired perceived functional ability, Increased fascial restricitons  Visit Diagnosis: Left-sided low back pain without sciatica, unspecified chronicity  Muscle weakness (generalized)     Problem List Patient Active Problem List   Diagnosis Date Noted  . Regurgitation and rechewing   . Dysphagia   . GERD (gastroesophageal reflux disease) 11/03/2013  . HTN (hypertension) 11/03/2013  . Hyperlipemia 11/03/2013  . Vomiting 11/03/2013   Ihor Austin, [place]; [place]  Aldona Lento 05/25/2019, 11:59 AM  [place], [place], [address] Phone: [phone]   Fax:  [phone]  Name: Dawn Stafford MRN: [id number] Date of Birth: Oct 21, 1956

## 2019-05-27 ENCOUNTER — Telehealth: Payer: Self-pay | Admitting: Gastroenterology

## 2019-05-27 NOTE — Telephone Encounter (Signed)
Patient is calling to know her MRI results.

## 2019-05-28 ENCOUNTER — Telehealth: Payer: Self-pay

## 2019-05-28 NOTE — Telephone Encounter (Signed)
Thanks for reviewing the MRI with her. Sorry to hear the patch did not help. She has failed most everything we have tried. She can see me in follow up in clinic to discuss other options. Thanks

## 2019-05-28 NOTE — Telephone Encounter (Signed)
Attempted to call patient on both phone #s. Home # has no voice mail. Work #-states she is not there today. Results were sent to My Chart on 05/26/19. Will try again later to call patient at home#

## 2019-05-28 NOTE — Telephone Encounter (Signed)
Patient called and wanted me to explain the MRI result note, which I did and also let her know she really did need to follow-up with her PCP. Patient said the Scopolamine patch did not help with the nausea

## 2019-05-28 NOTE — Telephone Encounter (Signed)
Called patient again, voice mail came on this time: left message that her MRI results were on My Chart, but if she wanted to get them verbally, to please call back

## 2019-05-29 ENCOUNTER — Encounter (HOSPITAL_COMMUNITY): Payer: Self-pay

## 2019-05-29 ENCOUNTER — Ambulatory Visit (HOSPITAL_COMMUNITY): Payer: PRIVATE HEALTH INSURANCE

## 2019-05-29 ENCOUNTER — Other Ambulatory Visit: Payer: Self-pay

## 2019-05-29 DIAGNOSIS — M545 Low back pain, unspecified: Secondary | ICD-10-CM

## 2019-05-29 DIAGNOSIS — M6281 Muscle weakness (generalized): Secondary | ICD-10-CM

## 2019-05-29 NOTE — Therapy (Signed)
[place], [place], [address] Phone: [phone]   Fax:  [phone]  Physical Therapy Treatment/Progress Note  Patient Details  Name: Dawn Stafford MRN: [id number] Date of Birth: [birth year] Referring Provider (PT): Renette Butters, MD   Encounter Date: 05/29/2019  Progress Note Reporting Period 04/29/19 to 05/29/19  See note below for Objective Data and Assessment of Progress/Goals.    PT End of Session - 05/29/19 1633    Visit Number  9    Number of Visits  17    Date for PT Re-Evaluation  06/26/19    Authorization Type  Workmans Comp (1-eval and 8 visits approved)    Authorization Time Period  04/29/19-05/29/19; 05/29/19-06/26/19    Authorization - Visit Number  1    Authorization - Number of Visits  8    PT Start Time  9794    PT Stop Time  1704    PT Time Calculation (min)  47 min    Activity Tolerance  Patient tolerated treatment well    Behavior During Therapy  WFL for tasks assessed/performed       Past Medical History:  Diagnosis Date  . Acid reflux 11/07/2010  . GERD (gastroesophageal reflux disease)   . Hyperlipidemia   . Hypertension 11/07/2010    Past Surgical History:  Procedure Laterality Date  . [place] STUDY N/A 03/03/2018   Procedure: 24 HOUR PH STUDY (on PPI);  Surgeon: Mauri Pole, MD;  Location: [facility];  Service: Endoscopy;  Laterality: N/A;  . BRAVO [place] STUDY  01/10/2011   [name]  . COLONOSCOPY     04/2013  . ESOPHAGEAL MANOMETRY N/A 03/03/2018   Procedure: ESOPHAGEAL MANOMETRY (EM);  Surgeon: Mauri Pole, MD;  Location: [facility];  Service: Endoscopy;  Laterality: N/A;  . ESOPHAGOGASTRODUODENOSCOPY  10/2010  . [place] IMPEDANCE STUDY N/A 03/03/2018   Procedure: PH IMPEDANCE STUDY (on PPI);  Surgeon: Mauri Pole, MD;  Location: [facility];  Service: Endoscopy;  Laterality: N/A;  . TUBAL LIGATION      There were no vitals filed for this  visit.  Subjective Assessment - 05/29/19 1622    Subjective  Patient reports she took Tylenol earlier today for her Lt hip and back as it became aggravated by doing laundry at home this morning. She reports it is better now but she is having 7/10 pain.    Limitations  House hold activities;Lifting;Standing;Walking    Patient Stated Goals  to have no pain with household and work activities    Currently in Pain?  Yes    Pain Score  7     Pain Location  Hip    Pain Orientation  Left    Pain Descriptors / Indicators  Aching;Dull    Pain Type  Chronic pain    Pain Onset  More than a month ago    Pain Frequency  Intermittent    Aggravating Factors   unsure    Pain Relieving Factors  unsure         [facility] PT Assessment - 05/29/19 0001      Assessment   Medical Diagnosis  Low Back Pain    Referring Provider (PT)  Renette Butters, MD    Onset Date/Surgical Date  03/06/19    Next MD Visit  July 8th    Prior Therapy  none      Precautions   Precautions  None  Restrictions   Weight Bearing Restrictions  No      Home Environment   Living Environment  Private residence    Living Arrangements  Alone    Available Help at Discharge  Family   nephew, and her friend   Type of [place] to enter    Entrance Stairs-Number of Steps  2    Entrance Stairs-Rails  None   no railing, but a [place]  One level    [place] - single point   several walking sticks     Prior Function   Level of Independence  Independent    Vocation  Full time employment    Vocation Requirements  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 Pt with narrow BOS and excessive trunk flexion and limited knee flexion.      Single Leg Stance   Comments  Bil LE = 35 seconds (therapist stopped patient)      Posture/Postural Control   Posture/Postural Control  No significant limitations      AROM   Overall AROM Comments  Quadrants: Patient with discomfort in Rt flex; Lt ext quadrants    Lumbar Flexion  WFL   pain in Lt  lower back   Lumbar Extension  WFL    Lumbar - Right Side Bend  WFL   pain in Lt  lower back   Lumbar - Left Side Bend  WFL   pain in Lt  lower back     Strength   Right Hip Flexion  4+/5   4   Right Hip Extension  4-/5   3   Right Hip ABduction  4-/5   3-   Left Hip Flexion  4+/5   4   Left Hip Extension  4-/5   3   Left Hip ABduction  4-/5   3-   Right Knee Flexion  4/5   4-   Right Knee Extension  4+/5   4   Left Knee Flexion  4/5   4-   Left Knee Extension  4+/5   4   Right Ankle Dorsiflexion  5/5   4+   Left Ankle Dorsiflexion  5/5   4+     Palpation   Spinal mobility  --    SI assessment   --    Palpation comment  --      Ambulation/Gait   Ambulation/Gait  Yes    Ambulation/Gait Assistance  7: Independent    Ambulation Distance (Feet)  408 Feet    Assistive device  None    Gait Pattern  Decreased stride length;Decreased hip/knee flexion - right;Decreased hip/knee flexion - left       [facility] Adult PT Treatment/Exercise - 05/29/19 0001      Exercises   Exercises  Knee/Hip  Knee/Hip Exercises: Standing   Lateral Step Up  Both;1 set;15 reps;Hand Hold: 2;Step Height: 6"    Forward Step Up  Both;1 set;15 reps;Step Height: 6";Hand Hold: 1    Functional Squat  1 set;10 reps    Functional Squat Limitations  cues for wider BOS and increased knee flexion    Stairs  5x ascend/descend stair (4x6") with  no hand rail, alternating step with slow cadence. 3x ascend/descend (4x6") stairs with 1 handrail and cues for increased cadence.       PT Education - 05/29/19 1723    Education Details  educated on progress since evaluation and on updated HEP.    Person(s) Educated  Patient    Methods  Explanation;Demonstration;Handout    Comprehension  Returned demonstration;Need further instruction;Verbalized understanding       PT Short Term Goals - 05/29/19 1731      PT SHORT TERM GOAL #1   Title  Patient will be independent with HEP, updated PRN, to improve functional strength and activity tolerance.    Time  2    Period  Weeks    Status  Achieved    Target Date  05/13/19      PT SHORT TERM GOAL #2   Title  Patient will demonstrate full pain free motion with lumbar AROM indicating reduced sensitivity to movement to return to prior activities.    Baseline  pt reports some stretching sensation    Time  2    Period  Weeks    Status  Achieved        PT Long Term Goals - 05/29/19 1731      PT LONG TERM GOAL #1   Title  Patient will demonstrate proper squat form with no pain to demonstrate improve mobility to return to work duties and home activities such as laundray and cleaning.    Time  4    Period  Weeks    Status  On-going    Target Date  06/26/19      PT LONG TERM GOAL #2   Title  Patient will demonstrate 1 grade improvement in MMT for bil LE's for improved functional LE strength to be able to perform work duties for full 8 hour shifts.    Time  4    Period  Weeks    Status  Achieved      PT LONG TERM GOAL #3   Title  Patient will achieve 20 seconds for SLS on bil LE's to indicate improved balance to be able to safely negotiate stairs with step over step pattern.    Baseline  35 seconds bil LE    Time  4    Period  Weeks    Status  Achieved       Plan - 05/29/19 1702    Clinical Impression Statement  Re-assessment performed today and patient has made excellent progress  towards goals and demonstrated much improved strength and balance. She improved all LE muscle groups by 1 grade with MMT, performed SLS on bil LE's for 35 seconds, and ambulated in 2MWT at 1.0 m/s with not significant gait deviations. She reported feeling stairs were "quite a bit difficult" on the FOTO measure but was able to ascend/descend 2 flights with no hand rail and alternating step pattern. She then stated it was only "a little difficult". Ms. Schumpert appears to have poor awareness of her functional abilities and continues to rate herself more limited than she appears with testing. She will benefit from  additional therapy to build confidence in mobility and navigating stairs, indoor and outdoor surface, and lifting activities.    Personal Factors and Comorbidities  Age;Comorbidity 1    Comorbidities  HTN    Examination-Activity Limitations  Squat;Stand;Sit;Reach Overhead;Stairs    Examination-Participation Restrictions  Cleaning;Community Activity;Laundry;Yard Work;Other    Stability/Clinical Decision Making  Stable/Uncomplicated    Rehab Potential  Good    PT Frequency  2x / week    PT Duration  4 weeks    PT Treatment/Interventions  ADLs/Self Care Home Management;Cryotherapy;Electrical Stimulation;Iontophoresis 4mg /ml Dexamethasone;Moist Heat;Functional mobility training;Therapeutic activities;Therapeutic exercise;Balance training;Neuromuscular re-education;Patient/family education;Manual techniques;Passive range of motion;Joint Manipulations;Spinal Manipulations    PT Next Visit Plan  Perform indoor/outdoor balance and stair negotiation to improve pt's confidence with activities. Continue functional strengthening and progress balance challanges.    PT Home Exercise Plan  SLR, bridge, sidelying hip abduction, prone hip extension (05/12/19 - updated with theraband); 05/18/19 - minisquat, SLS with vecors; 05/22/19: tandem stance by sink; forward/lateral step up    Consulted and Agree with Plan of Care   Patient       Patient will benefit from skilled therapeutic intervention in order to improve the following deficits and impairments:  Decreased activity tolerance, Decreased balance, Decreased mobility, Increased muscle spasms, Impaired flexibility, Pain, Decreased strength, Decreased endurance, Impaired perceived functional ability, Increased fascial restricitons  Visit Diagnosis: Left-sided low back pain without sciatica, unspecified chronicity   Muscle weakness (generalized)      Problem List Patient Active Problem List   Diagnosis Date Noted  . Regurgitation and rechewing   . Dysphagia   . GERD (gastroesophageal reflux disease) 11/03/2013  . HTN (hypertension) 11/03/2013  . Hyperlipemia 11/03/2013  . Vomiting 11/03/2013    Kipp Brood, PT, DPT, [facility] Physical Therapist with [facility]  05/29/2019 5:31 PM    [place] [address] [place], [place], [address] Phone: [phone]   Fax:  [phone]  Name: Dawn Stafford MRN: [id number] Date of Birth: [birth year]

## 2019-05-29 NOTE — Patient Instructions (Signed)
Step Up reps: 15 sets: 2 daily: 1 weekly: 7   Exercise image step 1   Exercise image step 2  Use railing or column for support   Setup  Begin in a standing upright position with a small step or platform in front of you. Movement  Step up onto the platform with one foot then follow with your other foot. Return to the starting position and repeat.  Tip  Make sure to maintain good posture during the exercise. Do not let your knee move forward past your toe as you step up. Lateral Step Up reps: 15 sets: 2 daily: 1 weekly: 7   Exercise image step 1   Exercise image step 2   Exercise image step 3  Use railing or column for support   Setup  Begin in a standing upright position with a step to your side. Movement  Step up with the foot closest to the step, then follow with you other foot. Step back down in the opposite order and repeat. Tip  Make sure to maintain your balance during the exercise and try to keep your hips level.

## 2019-05-29 NOTE — Telephone Encounter (Signed)
Patient scheduled for In-Person office visit 07/14/19 @2 :30pm

## 2019-06-01 ENCOUNTER — Ambulatory Visit (HOSPITAL_COMMUNITY): Payer: PRIVATE HEALTH INSURANCE

## 2019-06-01 ENCOUNTER — Telehealth (HOSPITAL_COMMUNITY): Payer: Self-pay | Admitting: Internal Medicine

## 2019-06-01 ENCOUNTER — Encounter (HOSPITAL_COMMUNITY): Payer: Self-pay

## 2019-06-01 ENCOUNTER — Other Ambulatory Visit: Payer: Self-pay

## 2019-06-01 DIAGNOSIS — M545 Low back pain, unspecified: Secondary | ICD-10-CM

## 2019-06-01 DIAGNOSIS — M6281 Muscle weakness (generalized): Secondary | ICD-10-CM | POA: Diagnosis not present

## 2019-06-01 NOTE — Therapy (Signed)
[place], [place], [address] Phone: [phone]   Fax:  [phone]  Physical Therapy Treatment  Patient Details  Name: Dawn Stafford MRN: [id number] Date of Birth: [birth year] Referring Provider (PT): Renette Butters, MD   Encounter Date: 06/01/2019  PT End of Session - 06/01/19 1614    Visit Number  10    Number of Visits  17    Date for PT Re-Evaluation  06/26/19    Authorization Type  Workmans Comp (1-eval and 8 visits approved)    Authorization Time Period  04/29/19-05/29/19; 05/29/19-06/26/19    Authorization - Visit Number  2    Authorization - Number of Visits  9    PT Start Time  3825    PT Stop Time  1646    PT Time Calculation (min)  42 min    Activity Tolerance  Patient tolerated treatment well    Behavior During Therapy  [name] for tasks assessed/performed       Past Medical History:  Diagnosis Date  . Acid reflux 11/07/2010  . GERD (gastroesophageal reflux disease)   . Hyperlipidemia   . Hypertension 11/07/2010    Past Surgical History:  Procedure Laterality Date  . [place] STUDY N/A 03/03/2018   Procedure: 24 HOUR PH STUDY (on PPI);  Surgeon: Mauri Pole, MD;  Location: [facility];  Service: Endoscopy;  Laterality: N/A;  . BRAVO [place] STUDY  01/10/2011   Dawn Stafford  . COLONOSCOPY     04/2013  . ESOPHAGEAL MANOMETRY N/A 03/03/2018   Procedure: ESOPHAGEAL MANOMETRY (EM);  Surgeon: Mauri Pole, MD;  Location: [facility];  Service: Endoscopy;  Laterality: N/A;  . ESOPHAGOGASTRODUODENOSCOPY  10/2010  . [place] IMPEDANCE STUDY N/A 03/03/2018   Procedure: PH IMPEDANCE STUDY (on PPI);  Surgeon: Mauri Pole, MD;  Location: [facility];  Service: Endoscopy;  Laterality: N/A;  . TUBAL LIGATION      There were no vitals filed for this visit.  Subjective Assessment - 06/01/19 1607    Subjective  Lt hip pain scale 6/10 intermittent dull achey pain.    Patient Stated Goals  to  have no pain with household and work activities    Currently in Pain?  Yes    Pain Score  6     Pain Location  Hip    Pain Orientation  Left    Pain Descriptors / Indicators  Aching;Dull    Pain Onset  More than a month ago    Pain Frequency  Intermittent    Aggravating Factors   unsure    Pain Relieving Factors  unsure                       [facility] Adult PT Treatment/Exercise - 06/01/19 0001      Knee/Hip Exercises: Standing   Forward Lunges  Both;10 reps    Forward Lunges Limitations  Cueing for mechanics    Lateral Step Up  Both;1 set;15 reps;Step Height: 6";Hand Hold: 1    Functional Squat  1 set;10 reps    Functional Squat Limitations  cues for wider BOS and increased knee flexion    Stairs  5x ascend/descend stair (4x6") with no hand rail, alternating step with slow cadence. 3x ascend/descend (4x6") stairs with 1 handrail and cues for increased cadence.    SLS  Rt 53", Lt 46" max     SLS with Vectors  3 way-vector: 5 sec  holds each way (5 reps bil LE)    Gait Training  Outdoor gait training on uneven ground, curbs, incline/decline slope    Other Standing Knee Exercises  Tandem stance on foam 2x 30", [place] 10x 2 sets      Manual Therapy   Manual Therapy  Soft tissue mobilization    Manual therapy comments  performed seperate from other interventions    Soft tissue mobilization  Lt hip to glute med with massage roller               PT Short Term Goals - 05/29/19 1731      PT SHORT TERM GOAL #1   Title  Patient will be independent with HEP, updated PRN, to improve functional strength and activity tolerance.    Time  2    Period  Weeks    Status  Achieved    Target Date  05/13/19      PT SHORT TERM GOAL #2   Title  Patient will demonstrate full pain free motion with lumbar AROM indicating reduced sensitivity to movement to return to prior activities.    Baseline  pt reports some stretching sensation    Time  2    Period  Weeks    Status   Achieved        PT Long Term Goals - 05/29/19 1731      PT LONG TERM GOAL #1   Title  Patient will demonstrate proper squat form with no pain to demonstrate improve mobility to return to work duties and home activities such as laundray and cleaning.    Time  4    Period  Weeks    Status  On-going    Target Date  06/26/19      PT LONG TERM GOAL #2   Title  Patient will demonstrate 1 grade improvement in MMT for bil LE's for improved functional LE strength to be able to perform work duties for full 8 hour shifts.    Time  4    Period  Weeks    Status  Achieved      PT LONG TERM GOAL #3   Title  Patient will achieve 20 seconds for SLS on bil LE's to indicate improved balance to be able to safely negotiate stairs with step over step pattern.    Baseline  35 seconds bil LE    Time  4    Period  Weeks    Status  Achieved            Plan - 06/01/19 1653    Clinical Impression Statement  Session foucs on functional strengthening and balance training.  Pt improving SLS and presents wiht improved confidence with increased speed completing stairs.  Outdoor gait training complete with no LOB on uneven terrain and curbs, slow and controlled cadence.  EOS with manual soft tissue mobilization to address restrictions and overall tightness in gluteal muscualture for pain control.  Pt reports minimal pain at EOS.    Personal Factors and Comorbidities  Age;Comorbidity 1    Comorbidities  HTN    Examination-Activity Limitations  Squat;Stand;Sit;Reach Overhead;Stairs    Examination-Participation Restrictions  Cleaning;Community Activity;Laundry;Yard Work;Other    Stability/Clinical Decision Making  Stable/Uncomplicated    Rehab Potential  Good    PT Frequency  2x / week    PT Duration  4 weeks    PT Treatment/Interventions  ADLs/Self Care Home Management;Cryotherapy;Electrical Stimulation;Iontophoresis 4mg /ml Dexamethasone;Moist Heat;Functional mobility training;Therapeutic  activities;Therapeutic exercise;Balance training;Neuromuscular re-education;Patient/family education;Manual  techniques;Passive range of motion;Joint Manipulations;Spinal Manipulations    PT Next Visit Plan  Add hurdles next session for balance training,  Perform indoor/outdoor balance and stair negotiation to improve pt's confidence with activities. Continue functional strengthening and progress balance challanges.    PT Home Exercise Plan  SLR, bridge, sidelying hip abduction, prone hip extension (05/12/19 - updated with theraband); 05/18/19 - minisquat, SLS with vecors; 05/22/19: tandem stance by sink; forward/lateral step up       Patient will benefit from skilled therapeutic intervention in order to improve the following deficits and impairments:  Decreased activity tolerance, Decreased balance, Decreased mobility, Increased muscle spasms, Impaired flexibility, Pain, Decreased strength, Decreased endurance, Impaired perceived functional ability, Increased fascial restricitons  Visit Diagnosis: 1. Muscle weakness (generalized)   2. Left-sided low back pain without sciatica, unspecified chronicity        Problem List Patient Active Problem List   Diagnosis Date Noted  . Regurgitation and rechewing   . Dysphagia   . GERD (gastroesophageal reflux disease) 11/03/2013  . HTN (hypertension) 11/03/2013  . Hyperlipemia 11/03/2013  . Vomiting 11/03/2013   Ihor Austin, [place]; [place]  Aldona Lento 06/01/2019, 4:59 PM  [place] [address] [place], [place], [address] Phone: [phone]   Fax:  [phone]  Name: Dawn Stafford MRN: [id number] Date of Birth: 21-Nov-1956

## 2019-06-01 NOTE — Telephone Encounter (Signed)
06/01/19  Patient called because she didn't have a schedule after 6/12 so I scheduled for more and called her back and asked if she could come in today at 4 and she said yes.  I told her that we would have a schedule for when she arrived today.

## 2019-06-04 ENCOUNTER — Other Ambulatory Visit: Payer: Self-pay

## 2019-06-04 ENCOUNTER — Ambulatory Visit (HOSPITAL_COMMUNITY): Payer: PRIVATE HEALTH INSURANCE | Admitting: Physical Therapy

## 2019-06-04 ENCOUNTER — Encounter (HOSPITAL_COMMUNITY): Payer: Self-pay | Admitting: Physical Therapy

## 2019-06-04 DIAGNOSIS — M6281 Muscle weakness (generalized): Secondary | ICD-10-CM | POA: Diagnosis not present

## 2019-06-04 DIAGNOSIS — M545 Low back pain, unspecified: Secondary | ICD-10-CM

## 2019-06-04 NOTE — Therapy (Signed)
[facility], [place], [address] Phone: [phone]   Fax:  [phone]  Physical Therapy Treatment  Patient Details  Name: Dawn Stafford MRN: [id number] Date of Birth: 04/06/1956 Referring Provider (PT): Renette Butters, MD   Encounter Date: 06/04/2019  PT End of Session - 06/04/19 1645    Visit Number  11    Number of Visits  17    Date for PT Re-Evaluation  06/26/19    Authorization Type  Workmans Comp (1-eval and 8 visits approved)    Authorization Time Period  04/29/19-05/29/19; 05/29/19-06/26/19    Authorization - Visit Number  3    Authorization - Number of Visits  9    PT Start Time  2751    PT Stop Time  1711    PT Time Calculation (min)  38 min    Activity Tolerance  Patient tolerated treatment well    Behavior During Therapy  [facility] for tasks assessed/performed       Past Medical History:  Diagnosis Date  . Acid reflux 11/07/2010  . GERD (gastroesophageal reflux disease)   . Hyperlipidemia   . Hypertension 11/07/2010    Past Surgical History:  Procedure Laterality Date  . [place] STUDY N/A 03/03/2018   Procedure: 24 HOUR PH STUDY (on PPI);  Surgeon: Mauri Pole, MD;  Location: [facility];  Service: Endoscopy;  Laterality: N/A;  . BRAVO [place] STUDY  01/10/2011   [name]  . COLONOSCOPY     04/2013  . ESOPHAGEAL MANOMETRY N/A 03/03/2018   Procedure: ESOPHAGEAL MANOMETRY (EM);  Surgeon: Mauri Pole, MD;  Location: [facility];  Service: Endoscopy;  Laterality: N/A;  . ESOPHAGOGASTRODUODENOSCOPY  10/2010  . [place] IMPEDANCE STUDY N/A 03/03/2018   Procedure: PH IMPEDANCE STUDY (on PPI);  Surgeon: Mauri Pole, MD;  Location: [facility];  Service: Endoscopy;  Laterality: N/A;  . TUBAL LIGATION      There were no vitals filed for this visit.  Subjective Assessment - 06/04/19 1637    Subjective  Patient still reporitng Lt hip pain scale 6/10 intermittent dull achey pain.  Patient reported she has been able to do her home exercises.    Patient Stated Goals  to have no pain with household and work activities    Currently in Pain?  Yes    Pain Score  6     Pain Location  Hip    Pain Orientation  Left    Pain Descriptors / Indicators  Dull    Pain Type  Chronic pain    Pain Onset  More than a month ago                       [facility] Adult PT Treatment/Exercise - 06/04/19 0001      Knee/Hip Exercises: Standing   Forward Lunges  Both;10 reps    Forward Lunges Limitations  Cueing for mechanics    Lateral Step Up  Both;1 set;15 reps;Step Height: 6";Hand Hold: 0    Forward Step Up  Both;1 set;15 reps;Step Height: 6";Hand Hold: 0    Functional Squat  1 set;10 reps    Functional Squat Limitations  cues for wider BOS and increased knee flexion    SLS with Vectors  3 way-vector: 5 sec holds each way (5 reps bil LE). No UE assist.     Other Standing Knee Exercises  Tandem stance on foam 3x 30"  Knee/Hip Exercises: Seated   Other Seated Knee/Hip Exercises  Seated marching over 6'' hurdle for improved hip flexor strength and control x15 each LE.      Manual Therapy   Manual Therapy  Soft tissue mobilization    Manual therapy comments  performed seperate from other interventions    Soft tissue mobilization  Lt hip to glute med with massage roller               PT Short Term Goals - 05/29/19 1731      PT SHORT TERM GOAL #1   Title  Patient will be independent with HEP, updated PRN, to improve functional strength and activity tolerance.    Time  2    Period  Weeks    Status  Achieved    Target Date  05/13/19      PT SHORT TERM GOAL #2   Title  Patient will demonstrate full pain free motion with lumbar AROM indicating reduced sensitivity to movement to return to prior activities.    Baseline  pt reports some stretching sensation    Time  2    Period  Weeks    Status  Achieved        PT Long Term Goals - 05/29/19 1731      PT  LONG TERM GOAL #1   Title  Patient will demonstrate proper squat form with no pain to demonstrate improve mobility to return to work duties and home activities such as laundray and cleaning.    Time  4    Period  Weeks    Status  On-going    Target Date  06/26/19      PT LONG TERM GOAL #2   Title  Patient will demonstrate 1 grade improvement in MMT for bil LE's for improved functional LE strength to be able to perform work duties for full 8 hour shifts.    Time  4    Period  Weeks    Status  Achieved      PT LONG TERM GOAL #3   Title  Patient will achieve 20 seconds for SLS on bil LE's to indicate improved balance to be able to safely negotiate stairs with step over step pattern.    Baseline  35 seconds bil LE    Time  4    Period  Weeks    Status  Achieved            Plan - 06/04/19 1719    Clinical Impression Statement  This session continued with established plan of care. Patient continued to require frequent cueing for proper form and technique of interventions throughout session. This session patient performing marching over 6'' hurdle with cueing to reduce posterior trunk lean for compensation. Ended session with manual therapy to patient's left glute medius with patient reporting a decrease in her pain following manual therapy.    Personal Factors and Comorbidities  Age;Comorbidity 1    Comorbidities  HTN    Examination-Activity Limitations  Squat;Stand;Sit;Reach Overhead;Stairs    Examination-Participation Restrictions  Cleaning;Community Activity;Laundry;Yard Work;Other    Stability/Clinical Decision Making  Stable/Uncomplicated    Rehab Potential  Good    PT Frequency  2x / week    PT Duration  4 weeks    PT Treatment/Interventions  ADLs/Self Care Home Management;Cryotherapy;Electrical Stimulation;Iontophoresis 4mg /ml Dexamethasone;Moist Heat;Functional mobility training;Therapeutic activities;Therapeutic exercise;Balance training;Neuromuscular  re-education;Patient/family education;Manual techniques;Passive range of motion;Joint Manipulations;Spinal Manipulations    PT Next Visit Plan  Add hurdles next session for  balance training,  Perform indoor/outdoor balance and stair negotiation to improve pt's confidence with activities. Continue functional strengthening and progress balance challanges.    PT Home Exercise Plan  SLR, bridge, sidelying hip abduction, prone hip extension (05/12/19 - updated with theraband); 05/18/19 - minisquat, SLS with vecors; 05/22/19: tandem stance by sink; forward/lateral step up       Patient will benefit from skilled therapeutic intervention in order to improve the following deficits and impairments:  Decreased activity tolerance, Decreased balance, Decreased mobility, Increased muscle spasms, Impaired flexibility, Pain, Decreased strength, Decreased endurance, Impaired perceived functional ability, Increased fascial restricitons  Visit Diagnosis: 1. Muscle weakness (generalized)   2. Left-sided low back pain without sciatica, unspecified chronicity        Problem List Patient Active Problem List   Diagnosis Date Noted  . Regurgitation and rechewing   . Dysphagia   . GERD (gastroesophageal reflux disease) 11/03/2013  . HTN (hypertension) 11/03/2013  . Hyperlipemia 11/03/2013  . Vomiting 11/03/2013    Clarene Critchley PT, DPT 5:21 PM, 06/04/19 [place], [place], [address] Phone: [phone]   Fax:  [phone]  Name: IVET GUERRIERI MRN: [id number] Date of Birth: 1956-08-07

## 2019-06-08 ENCOUNTER — Other Ambulatory Visit: Payer: Self-pay

## 2019-06-08 ENCOUNTER — Ambulatory Visit (HOSPITAL_COMMUNITY): Payer: PRIVATE HEALTH INSURANCE

## 2019-06-08 ENCOUNTER — Encounter (HOSPITAL_COMMUNITY): Payer: Self-pay

## 2019-06-08 DIAGNOSIS — M545 Low back pain, unspecified: Secondary | ICD-10-CM

## 2019-06-08 DIAGNOSIS — M6281 Muscle weakness (generalized): Secondary | ICD-10-CM

## 2019-06-08 NOTE — Therapy (Signed)
[place], [place], [address] Phone: [phone]   Fax:  [phone]  Physical Therapy Treatment  Patient Details  Name: Dawn Stafford MRN: [id number] Date of Birth: 1956-01-11 Referring Provider (PT): Renette Butters, MD   Encounter Date: 06/08/2019  PT End of Session - 06/08/19 1607    Visit Number  12    Number of Visits  17    Date for PT Re-Evaluation  06/26/19    Authorization Type  Workmans Comp (1-eval and 8 visits approved)    Authorization Time Period  04/29/19-05/29/19; 05/29/19-06/26/19    Authorization - Visit Number  4    Authorization - Number of Visits  9    PT Start Time  5465    PT Stop Time  1644    PT Time Calculation (min)  38 min    Activity Tolerance  Patient tolerated treatment well    Behavior During Therapy  [facility] for tasks assessed/performed       Past Medical History:  Diagnosis Date  . Acid reflux 11/07/2010  . GERD (gastroesophageal reflux disease)   . Hyperlipidemia   . Hypertension 11/07/2010    Past Surgical History:  Procedure Laterality Date  . [place] STUDY N/A 03/03/2018   Procedure: 24 HOUR PH STUDY (on PPI);  Surgeon: Mauri Pole, MD;  Location: [facility];  Service: Endoscopy;  Laterality: N/A;  . BRAVO [place] STUDY  01/10/2011   Dawn Stafford  . COLONOSCOPY     04/2013  . ESOPHAGEAL MANOMETRY N/A 03/03/2018   Procedure: ESOPHAGEAL MANOMETRY (EM);  Surgeon: Mauri Pole, MD;  Location: [facility];  Service: Endoscopy;  Laterality: N/A;  . ESOPHAGOGASTRODUODENOSCOPY  10/2010  . [place] IMPEDANCE STUDY N/A 03/03/2018   Procedure: PH IMPEDANCE STUDY (on PPI);  Surgeon: Mauri Pole, MD;  Location: [facility];  Service: Endoscopy;  Laterality: N/A;  . TUBAL LIGATION      There were no vitals filed for this visit.  Subjective Assessment - 06/08/19 1606    Subjective  Pain Lt hip 7/10 intermittent dull achey pain that worsens with standing for  prolonged period of time.  Reports relief for almost rest of day following manual therapy.    Patient Stated Goals  to have no pain with household and work activities    Currently in Pain?  Yes    Pain Score  7     Pain Location  Hip    Pain Orientation  Left    Pain Descriptors / Indicators  Aching;Dull    Pain Type  Chronic pain    Pain Onset  More than a month ago    Pain Frequency  Intermittent    Aggravating Factors   standing    Pain Relieving Factors  sitting                       [facility] Adult PT Treatment/Exercise - 06/08/19 0001      Knee/Hip Exercises: Standing   Forward Lunges  Both;10 reps    Forward Lunges Limitations  Cueing for mechanics, complete on 6in to improve mechanics    Functional Squat  2 sets;10 reps    Functional Squat Limitations  cues for wider BOS and increased knee flexion    Stairs  reciprocal pattern 5RT 7in step height, with 1 HR    SLS  3x each    SLS with Vectors  3 way-vector: 5 sec holds  each way (5 reps bil LE). No UE assist.     Other Standing Knee Exercises  Tandem stance on foam pallof 4x15    Other Standing Knee Exercises  foarward and side step oner 6 and 12in hurdles      Manual Therapy   Manual Therapy  Soft tissue mobilization    Manual therapy comments  performed seperate from other interventions    Soft tissue mobilization  Lt hip to glute med with massage roller               PT Short Term Goals - 05/29/19 1731      PT SHORT TERM GOAL #1   Title  Patient will be independent with HEP, updated PRN, to improve functional strength and activity tolerance.    Time  2    Period  Weeks    Status  Achieved    Target Date  05/13/19      PT SHORT TERM GOAL #2   Title  Patient will demonstrate full pain free motion with lumbar AROM indicating reduced sensitivity to movement to return to prior activities.    Baseline  pt reports some stretching sensation    Time  2    Period  Weeks    Status  Achieved         PT Long Term Goals - 05/29/19 1731      PT LONG TERM GOAL #1   Title  Patient will demonstrate proper squat form with no pain to demonstrate improve mobility to return to work duties and home activities such as laundray and cleaning.    Time  4    Period  Weeks    Status  On-going    Target Date  06/26/19      PT LONG TERM GOAL #2   Title  Patient will demonstrate 1 grade improvement in MMT for bil LE's for improved functional LE strength to be able to perform work duties for full 8 hour shifts.    Time  4    Period  Weeks    Status  Achieved      PT LONG TERM GOAL #3   Title  Patient will achieve 20 seconds for SLS on bil LE's to indicate improved balance to be able to safely negotiate stairs with step over step pattern.    Baseline  35 seconds bil LE    Time  4    Period  Weeks    Status  Achieved            Plan - 06/08/19 1645    Clinical Impression Statement  Continued with established POC for functional strengthening and balance training.  Modified lunges with assistance of 6in step to reduce load and improve mechanics wiht vast improvements noted though did require cueing for mechanics.  Progressed to 6& 12in height hurdles with marching exercises for SLS challenge with min A required for safety.  EOS with manual therapy for pain control, used roller over glut med to reduce tightness for pain control.  Noted tenderness over anterior proximal femus with palpation, reports of relief following manual.    Personal Factors and Comorbidities  Age;Comorbidity 1    Comorbidities  HTN    Examination-Activity Limitations  Squat;Stand;Sit;Reach Overhead;Stairs    Examination-Participation Restrictions  Cleaning;Community Activity;Laundry;Yard Work;Other    Stability/Clinical Decision Making  Stable/Uncomplicated    Clinical Decision Making  Low    Rehab Potential  Good    PT Frequency  2x / week  PT Duration  4 weeks    PT Treatment/Interventions  ADLs/Self Care Home  Management;Cryotherapy;Electrical Stimulation;Iontophoresis 4mg /ml Dexamethasone;Moist Heat;Functional mobility training;Therapeutic activities;Therapeutic exercise;Balance training;Neuromuscular re-education;Patient/family education;Manual techniques;Passive range of motion;Joint Manipulations;Spinal Manipulations    PT Next Visit Plan  Continue current POC.  Perform indoor/outdoor balance and stair negotiation to improve pt's confidence with activities. Continue functional strengthening and progress balance challanges.    PT Home Exercise Plan  SLR, bridge, sidelying hip abduction, prone hip extension (05/12/19 - updated with theraband); 05/18/19 - minisquat, SLS with vecors; 05/22/19: tandem stance by sink; forward/lateral step up       Patient will benefit from skilled therapeutic intervention in order to improve the following deficits and impairments:  Decreased activity tolerance, Decreased balance, Decreased mobility, Increased muscle spasms, Impaired flexibility, Pain, Decreased strength, Decreased endurance, Impaired perceived functional ability, Increased fascial restricitons  Visit Diagnosis: 1. Muscle weakness (generalized)   2. Left-sided low back pain without sciatica, unspecified chronicity        Problem List Patient Active Problem List   Diagnosis Date Noted  . Regurgitation and rechewing   . Dysphagia   . GERD (gastroesophageal reflux disease) 11/03/2013  . HTN (hypertension) 11/03/2013  . Hyperlipemia 11/03/2013  . Vomiting 11/03/2013   Ihor Austin, LPTA; [place]  Aldona Lento 06/08/2019, 4:53 PM  [place] [address] [place], [place], [address] Phone: [phone]   Fax:  [phone]  Name: Dawn Stafford MRN: [id number] Date of Birth: August 06, 1956

## 2019-06-09 MED FILL — AMLODIPINE BESYLATE 5 MG TA: 5 | 90 days supply | Qty: 90 | Fill #1

## 2019-06-11 ENCOUNTER — Ambulatory Visit (HOSPITAL_COMMUNITY): Payer: PRIVATE HEALTH INSURANCE

## 2019-06-11 ENCOUNTER — Other Ambulatory Visit: Payer: Self-pay

## 2019-06-11 ENCOUNTER — Encounter (HOSPITAL_COMMUNITY): Payer: Self-pay

## 2019-06-11 DIAGNOSIS — M6281 Muscle weakness (generalized): Secondary | ICD-10-CM | POA: Diagnosis not present

## 2019-06-11 DIAGNOSIS — M545 Low back pain, unspecified: Secondary | ICD-10-CM

## 2019-06-11 NOTE — Therapy (Signed)
[place], [place], [address] Phone: [phone]   Fax:  [phone]  Physical Therapy Treatment  Patient Details  Name: Dawn Stafford MRN: [id number] Date of Birth: 1956/11/19 Referring Provider (PT): Renette Butters, MD   Encounter Date: 06/11/2019  PT End of Session - 06/11/19 1702    Visit Number  13    Number of Visits  17    Date for PT Re-Evaluation  06/26/19    Authorization Type  Workmans Comp (1-eval and 8 visits approved)    Authorization Time Period  04/29/19-05/29/19; 05/29/19-06/26/19    Authorization - Visit Number  5    Authorization - Number of Visits  9    PT Start Time  5631    PT Stop Time  1642    PT Time Calculation (min)  39 min    Activity Tolerance  Patient tolerated treatment well    Behavior During Therapy  [facility] for tasks assessed/performed       Past Medical History:  Diagnosis Date  . Acid reflux 11/07/2010  . GERD (gastroesophageal reflux disease)   . Hyperlipidemia   . Hypertension 11/07/2010    Past Surgical History:  Procedure Laterality Date  . [place] STUDY N/A 03/03/2018   Procedure: 24 HOUR PH STUDY (on PPI);  Surgeon: Mauri Pole, MD;  Location: [facility];  Service: Endoscopy;  Laterality: N/A;  . BRAVO [place] STUDY  01/10/2011   [name]  . COLONOSCOPY     04/2013  . ESOPHAGEAL MANOMETRY N/A 03/03/2018   Procedure: ESOPHAGEAL MANOMETRY (EM);  Surgeon: Mauri Pole, MD;  Location: [facility];  Service: Endoscopy;  Laterality: N/A;  . ESOPHAGOGASTRODUODENOSCOPY  10/2010  . [place] IMPEDANCE STUDY N/A 03/03/2018   Procedure: PH IMPEDANCE STUDY (on PPI);  Surgeon: Mauri Pole, MD;  Location: [facility];  Service: Endoscopy;  Laterality: N/A;  . TUBAL LIGATION      There were no vitals filed for this visit.  Subjective Assessment - 06/11/19 1612    Subjective  Pain Lt hip 6/10 dull intermittent with standing for prolonged period of time.     Patient Stated Goals  to have no pain with household and work activities    Currently in Pain?  Yes    Pain Score  6     Pain Location  Hip    Pain Orientation  Left    Pain Descriptors / Indicators  Dull    Pain Type  Chronic pain    Pain Onset  More than a month ago    Pain Frequency  Intermittent    Aggravating Factors   standing    Pain Relieving Factors  sitting                       [facility] Adult PT Treatment/Exercise - 06/11/19 0001      Knee/Hip Exercises: Stretches   ITB Stretch  Left;3 reps;30 seconds    ITB Stretch Limitations  supine with rope    Other Knee/Hip Stretches  SKTC 2x 30"; SKTC (opposite shoulder 2x 30"      Knee/Hip Exercises: Standing   Functional Squat  2 sets;10 reps    Functional Squat Limitations  3D hip excursion; cues for wider BOS and increased knee flexion    Stairs  reciprocal pattern 5RT 7in step height, with 1 HR    SLS  3x each    SLS with Vectors  3 way-vector: 5 sec holds each way (5 reps bil LE). No UE assist.     Other Standing Knee Exercises  Tandem stance on foam pallof 4x15    Other Standing Knee Exercises  Proper lifting 8# box with cueing 10x;       Manual Therapy   Manual Therapy  Soft tissue mobilization    Manual therapy comments  performed seperate from other interventions    Soft tissue mobilization  Rt S/L; Lt hip Lt glute med, TFL and proximal ITB with STM and massage roller               PT Short Term Goals - 05/29/19 1731      PT SHORT TERM GOAL #1   Title  Patient will be independent with HEP, updated PRN, to improve functional strength and activity tolerance.    Time  2    Period  Weeks    Status  Achieved    Target Date  05/13/19      PT SHORT TERM GOAL #2   Title  Patient will demonstrate full pain free motion with lumbar AROM indicating reduced sensitivity to movement to return to prior activities.    Baseline  pt reports some stretching sensation    Time  2    Period  Weeks     Status  Achieved        PT Long Term Goals - 05/29/19 1731      PT LONG TERM GOAL #1   Title  Patient will demonstrate proper squat form with no pain to demonstrate improve mobility to return to work duties and home activities such as laundray and cleaning.    Time  4    Period  Weeks    Status  On-going    Target Date  06/26/19      PT LONG TERM GOAL #2   Title  Patient will demonstrate 1 grade improvement in MMT for bil LE's for improved functional LE strength to be able to perform work duties for full 8 hour shifts.    Time  4    Period  Weeks    Status  Achieved      PT LONG TERM GOAL #3   Title  Patient will achieve 20 seconds for SLS on bil LE's to indicate improved balance to be able to safely negotiate stairs with step over step pattern.    Baseline  35 seconds bil LE    Time  4    Period  Weeks    Status  Achieved            Plan - 06/11/19 1703    Clinical Impression Statement  Continued with established POC for functional strengthening, balance training, hip mobility and manual techniques for pain control.  Pt continues to require cueing for mechanics with squats, added box for proper lifting with improved mechanics noted.  Added 3D hip excursion and stretches to address restricitons.  EOS with manual therapi for pain control, noted trigger point TFL/proximal ITB that reduced with STM.  EOS pt reports vast improvements with pain.  Added ITB/SKTC opposite shoulder stretches to HEP.    Personal Factors and Comorbidities  Age;Comorbidity 1    Comorbidities  HTN    Examination-Activity Limitations  Squat;Stand;Sit;Reach Overhead;Stairs    Examination-Participation Restrictions  Cleaning;Community Activity;Laundry;Yard Work;Other    Stability/Clinical Decision Making  Stable/Uncomplicated    Clinical Decision Making  Low    Rehab Potential  Good    PT  Frequency  2x / week    PT Duration  4 weeks    PT Treatment/Interventions  ADLs/Self Care Home  Management;Cryotherapy;Electrical Stimulation;Iontophoresis 4mg /ml Dexamethasone;Moist Heat;Functional mobility training;Therapeutic activities;Therapeutic exercise;Balance training;Neuromuscular re-education;Patient/family education;Manual techniques;Passive range of motion;Joint Manipulations;Spinal Manipulations    PT Next Visit Plan  Continue current POC.  Perform indoor/outdoor balance and stair negotiation to improve pt's confidence with activities. Continue functional strengthening and progress balance challanges.    PT Home Exercise Plan  SLR, bridge, sidelying hip abduction, prone hip extension (05/12/19 - updated with theraband); 05/18/19 - minisquat, SLS with vecors; 05/22/19: tandem stance by sink; forward/lateral step up; 06/11/19: ITB/SKTC opposite shoulder stretch       Patient will benefit from skilled therapeutic intervention in order to improve the following deficits and impairments:  Decreased activity tolerance, Decreased balance, Decreased mobility, Increased muscle spasms, Impaired flexibility, Pain, Decreased strength, Decreased endurance, Impaired perceived functional ability, Increased fascial restricitons  Visit Diagnosis: 1. Muscle weakness (generalized)   2. Left-sided low back pain without sciatica, unspecified chronicity        Problem List Patient Active Problem List   Diagnosis Date Noted  . Regurgitation and rechewing   . Dysphagia   . GERD (gastroesophageal reflux disease) 11/03/2013  . HTN (hypertension) 11/03/2013  . Hyperlipemia 11/03/2013  . Vomiting 11/03/2013   Ihor Austin, [place]; [place]  Aldona Lento 06/11/2019, 5:09 PM  [place] [address] [place], [place], [address] Phone: [phone]   Fax:  [phone]  Name: Dawn Stafford MRN: [id number] Date of Birth: [birth year]

## 2019-06-15 ENCOUNTER — Other Ambulatory Visit: Payer: Self-pay

## 2019-06-15 ENCOUNTER — Ambulatory Visit (HOSPITAL_COMMUNITY): Payer: PRIVATE HEALTH INSURANCE | Admitting: Physical Therapy

## 2019-06-15 ENCOUNTER — Encounter (HOSPITAL_COMMUNITY): Payer: Self-pay | Admitting: Physical Therapy

## 2019-06-15 DIAGNOSIS — M545 Low back pain, unspecified: Secondary | ICD-10-CM

## 2019-06-15 DIAGNOSIS — M6281 Muscle weakness (generalized): Secondary | ICD-10-CM | POA: Diagnosis not present

## 2019-06-15 NOTE — Therapy (Signed)
[place], [place], [address] Phone: [phone]   Fax:  [phone]  Physical Therapy Treatment  Patient Details  Name: Dawn Stafford MRN: [id number] Date of Birth: 05-Mar-1956 Referring Provider (PT): Renette Butters, MD   Encounter Date: 06/15/2019  PT End of Session - 06/15/19 1539    Visit Number  14    Number of Visits  17    Date for PT Re-Evaluation  06/26/19    Authorization Type  Workmans Comp (1-eval and 8 visits approved)    Authorization Time Period  04/29/19-05/29/19; 05/29/19-06/26/19    Authorization - Visit Number  6    Authorization - Number of Visits  9    PT Start Time  1532    PT Stop Time  1610    PT Time Calculation (min)  38 min    Activity Tolerance  Patient tolerated treatment well    Behavior During Therapy  [facility] for tasks assessed/performed       Past Medical History:  Diagnosis Date  . Acid reflux 11/07/2010  . GERD (gastroesophageal reflux disease)   . Hyperlipidemia   . Hypertension 11/07/2010    Past Surgical History:  Procedure Laterality Date  . [place] STUDY N/A 03/03/2018   Procedure: 24 HOUR PH STUDY (on PPI);  Surgeon: Mauri Pole, MD;  Location: [facility];  Service: Endoscopy;  Laterality: N/A;  . BRAVO [place] STUDY  01/10/2011   [name]  . COLONOSCOPY     04/2013  . ESOPHAGEAL MANOMETRY N/A 03/03/2018   Procedure: ESOPHAGEAL MANOMETRY (EM);  Surgeon: Mauri Pole, MD;  Location: [facility];  Service: Endoscopy;  Laterality: N/A;  . ESOPHAGOGASTRODUODENOSCOPY  10/2010  . [place] IMPEDANCE STUDY N/A 03/03/2018   Procedure: PH IMPEDANCE STUDY (on PPI);  Surgeon: Mauri Pole, MD;  Location: [facility];  Service: Endoscopy;  Laterality: N/A;  . TUBAL LIGATION      There were no vitals filed for this visit.  Subjective Assessment - 06/15/19 1535    Subjective  Patient reported that she is having some pain in her left thigh which she rates  as a 6/10.    Patient Stated Goals  to have no pain with household and work activities    Currently in Pain?  Yes    Pain Score  6     Pain Location  Leg    Pain Orientation  Left    Pain Descriptors / Indicators  Dull    Pain Type  Chronic pain    Pain Onset  More than a month ago                       [facility] Adult PT Treatment/Exercise - 06/15/19 0001      Knee/Hip Exercises: Stretches   ITB Stretch  Left;3 reps;30 seconds    ITB Stretch Limitations  supine with rope    Other Knee/Hip Stretches  SKTC 2x 30"; SKTC (opposite shoulder 2x 30"    Other Knee/Hip Stretches  Lower trunk rotation: 10'' x 10 each side      Knee/Hip Exercises: Standing   SLS with Vectors  3 way-vector: 5 sec holds each way (5 reps bil LE). No UE assist.     Other Standing Knee Exercises  Tandem stance on foam pallof 2 x 20 RTB      Manual Therapy   Manual Therapy  Soft tissue mobilization    Manual  therapy comments  performed seperate from other interventions    Soft tissue mobilization  Rt S/L; IASTM with therastick. Lt hip Lt glute med, TFL and proximal ITB with STM and massage roller               PT Short Term Goals - 05/29/19 1731      PT SHORT TERM GOAL #1   Title  Patient will be independent with HEP, updated PRN, to improve functional strength and activity tolerance.    Time  2    Period  Weeks    Status  Achieved    Target Date  05/13/19      PT SHORT TERM GOAL #2   Title  Patient will demonstrate full pain free motion with lumbar AROM indicating reduced sensitivity to movement to return to prior activities.    Baseline  pt reports some stretching sensation    Time  2    Period  Weeks    Status  Achieved        PT Long Term Goals - 05/29/19 1731      PT LONG TERM GOAL #1   Title  Patient will demonstrate proper squat form with no pain to demonstrate improve mobility to return to work duties and home activities such as laundray and cleaning.    Time  4     Period  Weeks    Status  On-going    Target Date  06/26/19      PT LONG TERM GOAL #2   Title  Patient will demonstrate 1 grade improvement in MMT for bil LE's for improved functional LE strength to be able to perform work duties for full 8 hour shifts.    Time  4    Period  Weeks    Status  Achieved      PT LONG TERM GOAL #3   Title  Patient will achieve 20 seconds for SLS on bil LE's to indicate improved balance to be able to safely negotiate stairs with step over step pattern.    Baseline  35 seconds bil LE    Time  4    Period  Weeks    Status  Achieved            Plan - 06/15/19 1616    Clinical Impression Statement  Continued with established plan of care. This session added supine lower trunk rotations. Patient required frequent verbal cues for proper form and with performing the correct number of repetitions of exercises. Continued with soft tissue mobilization to improve tissue extensibility and decrease patient's pain. Patient reported a decrease in her pain following manual therapy.    Personal Factors and Comorbidities  Age;Comorbidity 1    Comorbidities  HTN    Examination-Activity Limitations  Squat;Stand;Sit;Reach Overhead;Stairs    Examination-Participation Restrictions  Cleaning;Community Activity;Laundry;Yard Work;Other    Stability/Clinical Decision Making  Stable/Uncomplicated    Rehab Potential  Good    PT Frequency  2x / week    PT Duration  4 weeks    PT Treatment/Interventions  ADLs/Self Care Home Management;Cryotherapy;Electrical Stimulation;Iontophoresis 4mg /ml Dexamethasone;Moist Heat;Functional mobility training;Therapeutic activities;Therapeutic exercise;Balance training;Neuromuscular re-education;Patient/family education;Manual techniques;Passive range of motion;Joint Manipulations;Spinal Manipulations    PT Next Visit Plan  Continue current POC.  Perform indoor/outdoor balance and stair negotiation to improve pt's confidence with activities. Continue  functional strengthening and progress balance challanges.    PT Home Exercise Plan  SLR, bridge, sidelying hip abduction, prone hip extension (05/12/19 - updated with theraband); 05/18/19 -  minisquat, SLS with vecors; 05/22/19: tandem stance by sink; forward/lateral step up; 06/11/19: ITB/SKTC opposite shoulder stretch       Patient will benefit from skilled therapeutic intervention in order to improve the following deficits and impairments:  Decreased activity tolerance, Decreased balance, Decreased mobility, Increased muscle spasms, Impaired flexibility, Pain, Decreased strength, Decreased endurance, Impaired perceived functional ability, Increased fascial restricitons  Visit Diagnosis: 1. Muscle weakness (generalized)   2. Left-sided low back pain without sciatica, unspecified chronicity        Problem List Patient Active Problem List   Diagnosis Date Noted  . Regurgitation and rechewing   . Dysphagia   . GERD (gastroesophageal reflux disease) 11/03/2013  . HTN (hypertension) 11/03/2013  . Hyperlipemia 11/03/2013  . Vomiting 11/03/2013   Dawn Stafford PT, DPT 4:19 PM, 06/15/19 [place] [address] [place], [place], [address] Phone: [phone]   Fax:  [phone]  Name: SALLEY BOXLEY MRN: [id number] Date of Birth: 09/08/1956

## 2019-06-17 ENCOUNTER — Other Ambulatory Visit: Payer: Self-pay | Admitting: Gastroenterology

## 2019-06-17 DIAGNOSIS — M545 Low back pain: Secondary | ICD-10-CM | POA: Diagnosis not present

## 2019-06-17 DIAGNOSIS — K219 Gastro-esophageal reflux disease without esophagitis: Secondary | ICD-10-CM | POA: Diagnosis not present

## 2019-06-18 ENCOUNTER — Ambulatory Visit (HOSPITAL_COMMUNITY): Payer: PRIVATE HEALTH INSURANCE | Attending: Internal Medicine | Admitting: Physical Therapy

## 2019-06-18 ENCOUNTER — Other Ambulatory Visit: Payer: Self-pay

## 2019-06-18 ENCOUNTER — Encounter (HOSPITAL_COMMUNITY): Payer: Self-pay | Admitting: Physical Therapy

## 2019-06-18 DIAGNOSIS — M545 Low back pain, unspecified: Secondary | ICD-10-CM

## 2019-06-18 DIAGNOSIS — M6281 Muscle weakness (generalized): Secondary | ICD-10-CM | POA: Diagnosis not present

## 2019-06-18 MED FILL — SUCRALFATE 1 GM TABLET: 1 | 23 days supply | Qty: 90 | Fill #0

## 2019-06-18 NOTE — Therapy (Signed)
[place], [place], [address] Phone: [phone]   Fax:  [phone]  Physical Therapy Treatment  Patient Details  Name: Dawn Stafford MRN: [id number] Date of Birth: [birth year] Referring Provider (PT): Renette Butters, MD   Encounter Date: 06/18/2019  PT End of Session - 06/18/19 1641    Visit Number  15    Number of Visits  17    Date for PT Re-Evaluation  06/26/19    Authorization Type  Workmans Comp (1-eval and 8 visits approved)    Authorization Time Period  04/29/19-05/29/19; 05/29/19-06/26/19    Authorization - Visit Number  7    Authorization - Number of Visits  9    PT Start Time  8676    PT Stop Time  1711    PT Time Calculation (min)  38 min    Activity Tolerance  Patient tolerated treatment well    Behavior During Therapy  [facility] for tasks assessed/performed       Past Medical History:  Diagnosis Date  . Acid reflux 11/07/2010  . GERD (gastroesophageal reflux disease)   . Hyperlipidemia   . Hypertension 11/07/2010    Past Surgical History:  Procedure Laterality Date  . [place] STUDY N/A 03/03/2018   Procedure: 24 HOUR PH STUDY (on PPI);  Surgeon: Mauri Pole, MD;  Location: [facility];  Service: Endoscopy;  Laterality: N/A;  . BRAVO [place] STUDY  01/10/2011   [name]  . COLONOSCOPY     04/2013  . ESOPHAGEAL MANOMETRY N/A 03/03/2018   Procedure: ESOPHAGEAL MANOMETRY (EM);  Surgeon: Mauri Pole, MD;  Location: [facility];  Service: Endoscopy;  Laterality: N/A;  . ESOPHAGOGASTRODUODENOSCOPY  10/2010  . [place] IMPEDANCE STUDY N/A 03/03/2018   Procedure: PH IMPEDANCE STUDY (on PPI);  Surgeon: Mauri Pole, MD;  Location: [facility];  Service: Endoscopy;  Laterality: N/A;  . TUBAL LIGATION      There were no vitals filed for this visit.  Subjective Assessment - 06/18/19 1636    Subjective  Patient reported that she is having a little more pain today which she rated as an  8/10. She stated she believes this may be because she did more yesterday around her house.    Patient Stated Goals  to have no pain with household and work activities    Currently in Pain?  Yes    Pain Score  8     Pain Location  Back    Pain Orientation  Left    Pain Descriptors / Indicators  Dull    Pain Type  Chronic pain    Pain Onset  More than a month ago                       [facility] Adult PT Treatment/Exercise - 06/18/19 0001      Knee/Hip Exercises: Stretches   ITB Stretch  Left;3 reps;30 seconds    ITB Stretch Limitations  supine with rope    Other Knee/Hip Stretches  SKTC 2x 30"    Other Knee/Hip Stretches  Lower trunk rotation: 10'' x 10 each side      Knee/Hip Exercises: Standing   SLS with Vectors  3 way-vector: 5 sec holds each way (5 reps bil LE). No UE assist.       Knee/Hip Exercises: Supine   Other Supine Knee/Hip Exercises  Hip isometrics: Abduction and adduction with strap and ball, respectively. 10x10'' each  VCs to exert about 50% of effort with exercise.       Manual Therapy   Manual Therapy  Soft tissue mobilization;Joint mobilization    Manual therapy comments  performed seperate from other interventions    Joint Mobilization  Grade II/III long axis distraction of femoracetabular joint 4x 30'' holds for pain relief     Soft tissue mobilization  Rt S/L; IASTM with therastick. Lt hip Lt glute med, TFL and proximal ITB with STM and massage roller             PT Education - 06/18/19 1638    Education Details  Educated on purpose and technique of interventions throughout session.    Person(s) Educated  Patient    Methods  Explanation    Comprehension  Verbalized understanding       PT Short Term Goals - 05/29/19 1731      PT SHORT TERM GOAL #1   Title  Patient will be independent with HEP, updated PRN, to improve functional strength and activity tolerance.    Time  2    Period  Weeks    Status  Achieved    Target Date  05/13/19       PT SHORT TERM GOAL #2   Title  Patient will demonstrate full pain free motion with lumbar AROM indicating reduced sensitivity to movement to return to prior activities.    Baseline  pt reports some stretching sensation    Time  2    Period  Weeks    Status  Achieved        PT Long Term Goals - 05/29/19 1731      PT LONG TERM GOAL #1   Title  Patient will demonstrate proper squat form with no pain to demonstrate improve mobility to return to work duties and home activities such as laundray and cleaning.    Time  4    Period  Weeks    Status  On-going    Target Date  06/26/19      PT LONG TERM GOAL #2   Title  Patient will demonstrate 1 grade improvement in MMT for bil LE's for improved functional LE strength to be able to perform work duties for full 8 hour shifts.    Time  4    Period  Weeks    Status  Achieved      PT LONG TERM GOAL #3   Title  Patient will achieve 20 seconds for SLS on bil LE's to indicate improved balance to be able to safely negotiate stairs with step over step pattern.    Baseline  35 seconds bil LE    Time  4    Period  Weeks    Status  Achieved            Plan - 06/18/19 1732    Clinical Impression Statement  Patient reported increased pain this session so modified plan of care to perform isometric exercises. Patient continued to require frequent cueing throughout exercises to keep track of amount of times performed and how many total exercises to complete in addition to requiring cues on proper form. Ended session with manual therapy. Therapist performed soft tissue mobilization in addition to long axis distraction which patient tolerated well and reported felt good. Patient reported a decrease in pain following manual therapy.    Personal Factors and Comorbidities  Age;Comorbidity 1    Comorbidities  HTN    Examination-Activity Limitations  Squat;Stand;Sit;Reach Overhead;Stairs  Examination-Participation Restrictions  Cleaning;Community  Activity;Laundry;Yard Work;Other    Stability/Clinical Decision Making  Stable/Uncomplicated    Rehab Potential  Good    PT Frequency  2x / week    PT Duration  4 weeks    PT Treatment/Interventions  ADLs/Self Care Home Management;Cryotherapy;Electrical Stimulation;Iontophoresis 4mg /ml Dexamethasone;Moist Heat;Functional mobility training;Therapeutic activities;Therapeutic exercise;Balance training;Neuromuscular re-education;Patient/family education;Manual techniques;Passive range of motion;Joint Manipulations;Spinal Manipulations    PT Next Visit Plan  Perform indoor/outdoor balance and stair negotiation to improve pt's confidence with activities. Continue functional strengthening and progress balance challanges.    PT Home Exercise Plan  SLR, bridge, sidelying hip abduction, prone hip extension (05/12/19 - updated with theraband); 05/18/19 - minisquat, SLS with vecors; 05/22/19: tandem stance by sink; forward/lateral step up; 06/11/19: ITB/SKTC opposite shoulder stretch       Patient will benefit from skilled therapeutic intervention in order to improve the following deficits and impairments:  Decreased activity tolerance, Decreased balance, Decreased mobility, Increased muscle spasms, Impaired flexibility, Pain, Decreased strength, Decreased endurance, Impaired perceived functional ability, Increased fascial restricitons  Visit Diagnosis: 1. Muscle weakness (generalized)   2. Left-sided low back pain without sciatica, unspecified chronicity        Problem List Patient Active Problem List   Diagnosis Date Noted  . Regurgitation and rechewing   . Dysphagia   . GERD (gastroesophageal reflux disease) 11/03/2013  . HTN (hypertension) 11/03/2013  . Hyperlipemia 11/03/2013  . Vomiting 11/03/2013   Clarene Critchley PT, DPT 5:36 PM, 06/18/19 [place], [place], [address] Phone: [phone]   Fax:  [phone]  Name:  Dawn Stafford MRN: [id number] Date of Birth: [birth year]

## 2019-06-22 ENCOUNTER — Ambulatory Visit (HOSPITAL_COMMUNITY): Payer: PRIVATE HEALTH INSURANCE | Admitting: Physical Therapy

## 2019-06-22 ENCOUNTER — Telehealth (HOSPITAL_COMMUNITY): Payer: Self-pay | Admitting: Internal Medicine

## 2019-06-22 MED FILL — PANTOPRAZOLE SOD DR 40 MG T: 40 | 90 days supply | Qty: 90 | Fill #1

## 2019-06-22 NOTE — Telephone Encounter (Signed)
06/22/19  8:51 left a message to cx and would see her at her next appt

## 2019-06-24 ENCOUNTER — Telehealth (HOSPITAL_COMMUNITY): Payer: Self-pay | Admitting: Internal Medicine

## 2019-06-24 NOTE — Telephone Encounter (Signed)
06/24/19  Left patient a message that her appt for 7/9 was moved over to [place] at 415 instead of with [place] at 430.  Just asked that she come in at 415 and not 430.

## 2019-06-25 ENCOUNTER — Ambulatory Visit (HOSPITAL_COMMUNITY): Payer: PRIVATE HEALTH INSURANCE

## 2019-06-25 ENCOUNTER — Encounter (HOSPITAL_COMMUNITY): Payer: Self-pay

## 2019-06-25 ENCOUNTER — Telehealth (HOSPITAL_COMMUNITY): Payer: Self-pay | Admitting: Physical Therapy

## 2019-06-25 ENCOUNTER — Other Ambulatory Visit: Payer: Self-pay

## 2019-06-25 DIAGNOSIS — M545 Low back pain, unspecified: Secondary | ICD-10-CM

## 2019-06-25 DIAGNOSIS — M6281 Muscle weakness (generalized): Secondary | ICD-10-CM | POA: Diagnosis not present

## 2019-06-25 NOTE — Telephone Encounter (Signed)
Received e-mail to add 6 more approved visits-scanned in chart. Faxed progress notes to Ms. Harper as requested 05-21-21-2020.

## 2019-06-25 NOTE — Therapy (Signed)
[place], [place], [address] Phone: [phone]   Fax:  [phone]  Physical Therapy Treatment  Patient Details  Name: Dawn Stafford MRN: [id number] Date of Birth: [birth year] Referring Provider (PT): Renette Butters, MD   Encounter Date: 06/25/2019  PT End of Session - 06/25/19 1626    Visit Number  16    Number of Visits  17    Date for PT Re-Evaluation  06/26/19    Authorization Type  Workmans Comp (1-eval and 8 visits approved)    Authorization Time Period  04/29/19-05/29/19; 05/29/19-06/26/19    Authorization - Visit Number  8    Authorization - Number of Visits  9    PT Start Time  1620    PT Stop Time  1658    PT Time Calculation (min)  38 min    Activity Tolerance  Patient tolerated treatment well    Behavior During Therapy  [facility] for tasks assessed/performed       Past Medical History:  Diagnosis Date  . Acid reflux 11/07/2010  . GERD (gastroesophageal reflux disease)   . Hyperlipidemia   . Hypertension 11/07/2010    Past Surgical History:  Procedure Laterality Date  . [place] STUDY N/A 03/03/2018   Procedure: 24 HOUR PH STUDY (on PPI);  Surgeon: Mauri Pole, MD;  Location: [facility];  Service: Endoscopy;  Laterality: N/A;  . BRAVO [place] STUDY  01/10/2011   [name]  . COLONOSCOPY     04/2013  . ESOPHAGEAL MANOMETRY N/A 03/03/2018   Procedure: ESOPHAGEAL MANOMETRY (EM);  Surgeon: Mauri Pole, MD;  Location: [facility];  Service: Endoscopy;  Laterality: N/A;  . ESOPHAGOGASTRODUODENOSCOPY  10/2010  . [place] IMPEDANCE STUDY N/A 03/03/2018   Procedure: PH IMPEDANCE STUDY (on PPI);  Surgeon: Mauri Pole, MD;  Location: [facility];  Service: Endoscopy;  Laterality: N/A;  . TUBAL LIGATION      There were no vitals filed for this visit.  Subjective Assessment - 06/25/19 1623    Subjective  Pt brought copy of referral to continue OPPT for 6 more visits.  Reports some pain  on lateral Lt hip pain scale 7/10 today.    Currently in Pain?  Yes    Pain Score  7     Pain Location  Hip    Pain Orientation  Left;Lateral    Pain Type  Chronic pain    Pain Onset  More than a month ago    Pain Frequency  Intermittent    Aggravating Factors   standing    Pain Relieving Factors  sitting                       [facility] Adult PT Treatment/Exercise - 06/25/19 0001      Knee/Hip Exercises: Stretches   ITB Stretch  Left;3 reps;30 seconds    ITB Stretch Limitations  supine with rope    Other Knee/Hip Stretches  Lower trunk rotation: 10'' x 10 each side      Knee/Hip Exercises: Standing   Functional Squat  2 sets;10 reps    Functional Squat Limitations  chair tapping; cues for wider stance    Stairs  reciprocal pattern 5RT 7in step height, with 1 HR    SLS with Vectors  3 way-vector: 5 sec holds each way (5 reps bil LE). No UE assist.       Knee/Hip Exercises: Seated   Other  Seated Knee/Hip Exercises  seated on theraball marching 5x 3" holds               PT Short Term Goals - 05/29/19 1731      PT SHORT TERM GOAL #1   Title  Patient will be independent with HEP, updated PRN, to improve functional strength and activity tolerance.    Time  2    Period  Weeks    Status  Achieved    Target Date  05/13/19      PT SHORT TERM GOAL #2   Title  Patient will demonstrate full pain free motion with lumbar AROM indicating reduced sensitivity to movement to return to prior activities.    Baseline  pt reports some stretching sensation    Time  2    Period  Weeks    Status  Achieved        PT Long Term Goals - 05/29/19 1731      PT LONG TERM GOAL #1   Title  Patient will demonstrate proper squat form with no pain to demonstrate improve mobility to return to work duties and home activities such as laundray and cleaning.    Time  4    Period  Weeks    Status  On-going    Target Date  06/26/19      PT LONG TERM GOAL #2   Title  Patient will  demonstrate 1 grade improvement in MMT for bil LE's for improved functional LE strength to be able to perform work duties for full 8 hour shifts.    Time  4    Period  Weeks    Status  Achieved      PT LONG TERM GOAL #3   Title  Patient will achieve 20 seconds for SLS on bil LE's to indicate improved balance to be able to safely negotiate stairs with step over step pattern.    Baseline  35 seconds bil LE    Time  4    Period  Weeks    Status  Achieved            Plan - 06/25/19 1639    Clinical Impression Statement  Continued session focus on LE strengthening and mobilty to assist with pain.  Pt continues to require frequent cueing for proper form with majority of exercises, as well as keeping track of reps and appropriate hold times though.  Pt presents with improve confidence with stairs, able to ascend and descend without handrails with increased speed.  EOS with stretches and manual soft tissue mobilization to Lt gluteal and ITB/TFL region with reports of relief following.    Personal Factors and Comorbidities  Age;Comorbidity 1    Comorbidities  HTN    Examination-Activity Limitations  Squat;Stand;Sit;Reach Overhead;Stairs    Examination-Participation Restrictions  Cleaning;Community Activity;Laundry;Yard Work;Other    Clinical Decision Making  Low    Rehab Potential  Good    PT Frequency  2x / week    PT Duration  4 weeks    PT Treatment/Interventions  ADLs/Self Care Home Management;Cryotherapy;Electrical Stimulation;Iontophoresis 4mg /ml Dexamethasone;Moist Heat;Functional mobility training;Therapeutic activities;Therapeutic exercise;Balance training;Neuromuscular re-education;Patient/family education;Manual techniques;Passive range of motion;Joint Manipulations;Spinal Manipulations    PT Next Visit Plan  Reassess next session.  Received order to continue OPPT for 6 more sessions.    PT Home Exercise Plan  SLR, bridge, sidelying hip abduction, prone hip extension (05/12/19 -  updated with theraband); 05/18/19 - minisquat, SLS with vecors; 05/22/19: tandem stance by sink; forward/lateral  step up; 06/11/19: ITB/SKTC opposite shoulder stretch       Patient will benefit from skilled therapeutic intervention in order to improve the following deficits and impairments:  Decreased activity tolerance, Decreased balance, Decreased mobility, Increased muscle spasms, Impaired flexibility, Pain, Decreased strength, Decreased endurance, Impaired perceived functional ability, Increased fascial restricitons  Visit Diagnosis: 1. Left-sided low back pain without sciatica, unspecified chronicity   2. Muscle weakness (generalized)        Problem List Patient Active Problem List   Diagnosis Date Noted  . Regurgitation and rechewing   . Dysphagia   . GERD (gastroesophageal reflux disease) 11/03/2013  . HTN (hypertension) 11/03/2013  . Hyperlipemia 11/03/2013  . Vomiting 11/03/2013   Ihor Austin, [place]; [place]  Aldona Lento 06/25/2019, 6:08 PM  [place] [address] [place], [place], [address] Phone: [phone]   Fax:  [phone]  Name: Dawn Stafford MRN: [id number] Date of Birth: [birth year]

## 2019-06-29 ENCOUNTER — Other Ambulatory Visit: Payer: Self-pay

## 2019-06-29 ENCOUNTER — Encounter (HOSPITAL_COMMUNITY): Payer: Self-pay

## 2019-06-29 ENCOUNTER — Ambulatory Visit (HOSPITAL_COMMUNITY): Payer: PRIVATE HEALTH INSURANCE

## 2019-06-29 ENCOUNTER — Telehealth (HOSPITAL_COMMUNITY): Payer: Self-pay | Admitting: Internal Medicine

## 2019-06-29 DIAGNOSIS — M6281 Muscle weakness (generalized): Secondary | ICD-10-CM | POA: Diagnosis not present

## 2019-06-29 DIAGNOSIS — M545 Low back pain, unspecified: Secondary | ICD-10-CM

## 2019-06-29 NOTE — Telephone Encounter (Signed)
06/29/19  [name] questioned the approval of additional visits for patient.  In the appointment notes [name] put that the patient was approved for 6 additional visits and paperwork was scanned in the chart.  [name] couldn't find and she asked me to look and I couldn't find either.  I called Sydnee Levans, Case Mgr. And she is going to email me what she sent to [name].  The patient has been approved for these visits just waiting on the paperwork so it can be scanned into Epic.  I also let [name] know.

## 2019-06-29 NOTE — Addendum Note (Signed)
Addended by: Jacques Navy on: 06/29/2019 05:46 PM   Modules accepted: Orders

## 2019-06-29 NOTE — Therapy (Signed)
[place], [place], [address] Phone: [phone]   Fax:  [phone]  Physical Therapy Treatment & Progress Note  Patient Details  Name: Dawn Stafford MRN: [id number] Date of Birth: 07/17/1956 Referring Provider (PT): Renette Butters, MD   Encounter Date: 06/29/2019  PT End of Session - 06/29/19 1419    Visit Number  17    Number of Visits  17    Date for PT Re-Evaluation  06/26/19    Authorization Type  Workmans Comp (1-eval and 8 visits approved)    Authorization Time Period  04/29/19-05/29/19; 05/29/19-06/26/19    Authorization - Visit Number  8    Authorization - Number of Visits  8    PT Start Time  0100    PT Stop Time  7121    PT Time Calculation (min)  38 min    Activity Tolerance  Patient tolerated treatment well    Behavior During Therapy  [facility] for tasks assessed/performed       Past Medical History:  Diagnosis Date  . Acid reflux 11/07/2010  . GERD (gastroesophageal reflux disease)   . Hyperlipidemia   . Hypertension 11/07/2010    Past Surgical History:  Procedure Laterality Date  . [place] STUDY N/A 03/03/2018   Procedure: 24 HOUR PH STUDY (on PPI);  Surgeon: Mauri Pole, MD;  Location: [facility];  Service: Endoscopy;  Laterality: N/A;  . BRAVO [place] STUDY  01/10/2011   [name]  . COLONOSCOPY     04/2013  . ESOPHAGEAL MANOMETRY N/A 03/03/2018   Procedure: ESOPHAGEAL MANOMETRY (EM);  Surgeon: Mauri Pole, MD;  Location: [facility];  Service: Endoscopy;  Laterality: N/A;  . ESOPHAGOGASTRODUODENOSCOPY  10/2010  . [place] IMPEDANCE STUDY N/A 03/03/2018   Procedure: PH IMPEDANCE STUDY (on PPI);  Surgeon: Mauri Pole, MD;  Location: [facility];  Service: Endoscopy;  Laterality: N/A;  . TUBAL LIGATION      There were no vitals filed for this visit.  Subjective Assessment - 06/29/19 1422    Subjective  Patient reports she is supposed to follow up with Dr. Maretta Los on  07/22/19. She is having 7/10 pain today and that it feels like a thoothache along her lateral thigh and hip onLt LE.    Limitations  House hold activities;Lifting;Standing;Walking    Patient Stated Goals  --    Currently in Pain?  Yes    Pain Score  7     Pain Location  Hip    Pain Orientation  Left;Lateral    Pain Descriptors / Indicators  Aching    Pain Type  Chronic pain    Pain Onset  More than a month ago         [facility] PT Assessment - 06/29/19 0001      Assessment   Medical Diagnosis  Low Back Pain    Referring Provider (PT)  Renette Butters, MD    Onset Date/Surgical Date  03/06/19    Next MD Visit  July 8th    Prior Therapy  none      Precautions   Precautions  None      Restrictions   Weight Bearing Restrictions  No      [place] residence    Living Arrangements  Alone    Available Help at Discharge  Family   nephew, and her friend   Type of [place]  Home Access  Stairs to enter    Entrance Stairs-Number of Steps  2    Entrance Stairs-Rails  None   no railing, but a banister   Home Layout  One level    [place] - single point   several walking sticks     Prior Function   Level of Independence  Independent    Vocation  Full time employment    Vocation Requirements  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 Pt with narrow BOS and excessive trunk flexion and limited knee flexion.      Single Leg Stance    Comments  Lt LE = 40 seconds; Rt LE = 20 sec      Posture/Postural Control   Posture/Postural Control  No significant limitations      AROM   Overall AROM Comments  Quadrants: Patient with discomfort in Rt flex; Lt ext quadrants    Lumbar Flexion  WFL   pain in Lt  lower back   Lumbar Extension  WFL    Lumbar - Right Side Bend  WFL   pain in Lt  lower back   Lumbar - Left Side Bend  WFL   pain in Lt  lower back     Strength   Right Hip Flexion  5/5   4   Right Hip Extension  4-/5   3   Right Hip ABduction  4+/5   3-   Left Hip Flexion  4+/5   4   Left Hip Extension  4/5   3   Left Hip ABduction  4/5   3-   Right Knee Flexion  4+/5   4-   Right Knee Extension  5/5   4   Left Knee Flexion  4+/5   4-   Left Knee Extension  5/5   4   Right Ankle Dorsiflexion  5/5   4+   Left Ankle Dorsiflexion  5/5   4+     Ambulation/Gait   Ambulation/Gait  Yes    Ambulation/Gait Assistance  7: Independent    Ambulation Distance (Feet)  408 Feet    Assistive device  None    Gait Pattern  Decreased stride length;Decreased hip/knee flexion - right;Decreased hip/knee flexion - left        [facility] Adult PT Treatment/Exercise - 06/29/19 0001      Knee/Hip Exercises: Stretches   ITB Stretch  Left;3 reps;30 seconds    ITB Stretch Limitations  supine with rope    Other Knee/Hip Stretches  Lt LE SKTC 5x 10"      Knee/Hip Exercises: Standing   Other Standing Knee Exercises  Hip Hike: 1x 10 reps Bil LE        PT Education - 06/29/19 1729    Education Details  Educated on plan to continue with additional 6 visits approved.  Person(s) Educated  Patient    Methods  Explanation    Comprehension  Verbalized understanding       PT Short Term Goals - 06/29/19 1440      PT SHORT TERM GOAL #1   Title  Patient will be independent with HEP, updated PRN, to improve functional strength and activity tolerance.    Time  2    Period  Weeks    Status  Achieved    Target Date  05/13/19       PT SHORT TERM GOAL #2   Title  Patient will demonstrate full pain free motion with lumbar AROM indicating reduced sensitivity to movement to return to prior activities.    Baseline  pt reports some stretching sensation    Time  2    Period  Weeks    Status  Achieved        PT Long Term Goals - 06/29/19 1441      PT LONG TERM GOAL #1   Title  Patient will demonstrate proper squat form with no pain to demonstrate improve mobility to return to work duties and home activities such as laundray and cleaning.    Time  4    Period  Weeks    Status  On-going      PT LONG TERM GOAL #2   Title  Patient will demonstrate 1 grade improvement in MMT for bil LE's for improved functional LE strength to be able to perform work duties for full 8 hour shifts.    Time  4    Period  Weeks    Status  Achieved      PT LONG TERM GOAL #3   Title  Patient will achieve 20 seconds for SLS on bil LE's to indicate improved balance to be able to safely negotiate stairs with step over step pattern.    Baseline  35 seconds bil LE    Time  4    Period  Weeks    Status  Achieved        Plan - 06/29/19 1505    Clinical Impression Statement  Re-assessed patient today and she has made additional progress with muscle strength. She continues to have poor carry-over with form for functional squat and mobility testing and is limited by fear/axniety with stair mobility. Pt is able to perform 3 flights of stairs with no UE assist and is able to maintain balance. With encouragement pt was able to increase speed to negotiate stairs and continued step over step pattern. She has excellent walking speed and no overt signs of LOB and her only remaining deficit is Lt lateral hip/thigh pain. She will benefit from additional 6 visits to progress HEP with strethces and address pain with manual interventions.    Personal Factors and Comorbidities  Age;Comorbidity 1    Comorbidities  HTN    Examination-Activity Limitations   Squat;Stand;Sit;Reach Overhead;Stairs    Examination-Participation Restrictions  Cleaning;Community Activity;Laundry;Yard Work;Other    Rehab Potential  Good    PT Frequency  2x / week    PT Duration  4 weeks    PT Treatment/Interventions  ADLs/Self Care Home Management;Cryotherapy;Electrical Stimulation;Iontophoresis 4mg /ml Dexamethasone;Moist Heat;Functional mobility training;Therapeutic activities;Therapeutic exercise;Balance training;Neuromuscular re-education;Patient/family education;Manual techniques;Passive range of motion;Joint Manipulations;Spinal Manipulations    PT Next Visit Plan  Received order to continue OPPT for 6 more sessions.    PT Home Exercise Plan  SLR, bridge, sidelying hip abduction, prone hip extension (05/12/19 - updated with theraband); 05/18/19 - minisquat, SLS  with vecors; 05/22/19: tandem stance by sink; forward/lateral step up; 06/11/19: ITB/SKTC opposite shoulder stretch    Consulted and Agree with Plan of Care  Patient       Patient will benefit from skilled therapeutic intervention in order to improve the following deficits and impairments:  Decreased activity tolerance, Decreased balance, Decreased mobility, Increased muscle spasms, Impaired flexibility, Pain, Decreased strength, Decreased endurance, Impaired perceived functional ability, Increased fascial restricitons  Visit Diagnosis: 1. Left-sided low back pain without sciatica, unspecified chronicity   2. Muscle weakness (generalized)        Problem List Patient Active Problem List   Diagnosis Date Noted  . Regurgitation and rechewing   . Dysphagia   . GERD (gastroesophageal reflux disease) 11/03/2013  . HTN (hypertension) 11/03/2013  . Hyperlipemia 11/03/2013  . Vomiting 11/03/2013    Kipp Brood, PT, DPT, [facility] Physical Therapist with [facility]  06/29/2019 5:29 PM    [place] [address] [place], [place],  [address] Phone: [phone]   Fax:  [phone]  Name: Dawn Stafford MRN: [id number] Date of Birth: 02/16/1956

## 2019-07-03 ENCOUNTER — Other Ambulatory Visit: Payer: Self-pay

## 2019-07-03 ENCOUNTER — Encounter (HOSPITAL_COMMUNITY): Payer: Self-pay | Admitting: Physical Therapy

## 2019-07-03 ENCOUNTER — Ambulatory Visit (HOSPITAL_COMMUNITY): Payer: PRIVATE HEALTH INSURANCE | Admitting: Physical Therapy

## 2019-07-03 DIAGNOSIS — M545 Low back pain, unspecified: Secondary | ICD-10-CM

## 2019-07-03 DIAGNOSIS — M6281 Muscle weakness (generalized): Secondary | ICD-10-CM

## 2019-07-03 NOTE — Therapy (Addendum)
[place], [place], [address] Phone: [phone]   Fax:  [phone]  Physical Therapy Treatment  Patient Details  Name: Dawn Stafford MRN: [id number] Date of Birth: 08-12-1956 Referring Provider (PT): Renette Butters, MD   Encounter Date: 07/03/2019  PT End of Session - 07/03/19 1646    Visit Number  18    Number of Visits  23    Date for PT Re-Evaluation  07/20/19    Authorization Type  Workmans Comp (1-eval and 8 visits approved)    Authorization Time Period  04/29/19-05/29/19; 05/29/19-06/26/19; 06/29/19-07/20/19    Authorization - Visit Number  1    Authorization - Number of Visits  6    PT Start Time  1779    PT Stop Time  1723    PT Time Calculation (min)  39 min    Activity Tolerance  Patient tolerated treatment well    Behavior During Therapy  [facility] for tasks assessed/performed       Past Medical History:  Diagnosis Date  . Acid reflux 11/07/2010  . GERD (gastroesophageal reflux disease)   . Hyperlipidemia   . Hypertension 11/07/2010    Past Surgical History:  Procedure Laterality Date  . [place] STUDY N/A 03/03/2018   Procedure: 24 HOUR PH STUDY (on PPI);  Surgeon: Mauri Pole, MD;  Location: [facility];  Service: Endoscopy;  Laterality: N/A;  . BRAVO [place] STUDY  01/10/2011   [name]  . COLONOSCOPY     04/2013  . ESOPHAGEAL MANOMETRY N/A 03/03/2018   Procedure: ESOPHAGEAL MANOMETRY (EM);  Surgeon: Mauri Pole, MD;  Location: [facility];  Service: Endoscopy;  Laterality: N/A;  . ESOPHAGOGASTRODUODENOSCOPY  10/2010  . [place] IMPEDANCE STUDY N/A 03/03/2018   Procedure: PH IMPEDANCE STUDY (on PPI);  Surgeon: Mauri Pole, MD;  Location: [facility];  Service: Endoscopy;  Laterality: N/A;  . TUBAL LIGATION      There were no vitals filed for this visit.  Subjective Assessment - 07/03/19 1645    Subjective  Patient reported that she is having some left hip pain which she  rates as a 7/10.    Limitations  House hold activities;Lifting;Standing;Walking    Currently in Pain?  Yes    Pain Score  7     Pain Location  Hip    Pain Orientation  Left    Pain Descriptors / Indicators  Aching    Pain Type  Chronic pain    Pain Onset  More than a month ago                       [facility] Adult PT Treatment/Exercise - 07/03/19 0001      Knee/Hip Exercises: Stretches   ITB Stretch  Left;3 reps;30 seconds    ITB Stretch Limitations  supine with rope    Other Knee/Hip Stretches  Lt LE SKTC 5x 10"    Other Knee/Hip Stretches  Lower trunk rotation: 10'' x 10 each side      Knee/Hip Exercises: Standing   Other Standing Knee Exercises  Hip Hike: 1x 10 reps Bil LE standing on 4'' step      Manual Therapy   Manual Therapy  Soft tissue mobilization;Joint mobilization    Manual therapy comments  performed seperate from other interventions    Joint Mobilization  Grade II/III long axis distraction of femoracetabular joint 4x 30'' holds for pain relief  Soft tissue mobilization  Rt S/L; IASTM with therastick. Lt hip Lt glute med, TFL and proximal ITB with STM and massage roller               PT Short Term Goals - 06/29/19 1440      PT SHORT TERM GOAL #1   Title  Patient will be independent with HEP, updated PRN, to improve functional strength and activity tolerance.    Time  2    Period  Weeks    Status  Achieved    Target Date  05/13/19      PT SHORT TERM GOAL #2   Title  Patient will demonstrate full pain free motion with lumbar AROM indicating reduced sensitivity to movement to return to prior activities.    Baseline  pt reports some stretching sensation    Time  2    Period  Weeks    Status  Achieved        PT Long Term Goals - 06/29/19 1441      PT LONG TERM GOAL #1   Title  Patient will demonstrate proper squat form with no pain to demonstrate improve mobility to return to work duties and home activities such as laundray and  cleaning.    Time  4    Period  Weeks    Status  On-going      PT LONG TERM GOAL #2   Title  Patient will demonstrate 1 grade improvement in MMT for bil LE's for improved functional LE strength to be able to perform work duties for full 8 hour shifts.    Time  4    Period  Weeks    Status  Achieved      PT LONG TERM GOAL #3   Title  Patient will achieve 20 seconds for SLS on bil LE's to indicate improved balance to be able to safely negotiate stairs with step over step pattern.    Baseline  35 seconds bil LE    Time  4    Period  Weeks    Status  Achieved            Plan - 07/03/19 1728    Clinical Impression Statement  This session focused on manual therapy to decrease patient's pain. Performed joint mobilization of long axis distraction for pain modulation. Soft tissue mobilization using a red weighted ball, as well as using hands to address tightness in patient's left IT band. Patient reported a decrease in pain following the manual, however patient did not quantify pain. Plan to continue with a focus on manual therapy to decrease patient's pain.    Personal Factors and Comorbidities  Age;Comorbidity 1    Comorbidities  HTN    Examination-Activity Limitations  Squat;Stand;Sit;Reach Overhead;Stairs    Examination-Participation Restrictions  Cleaning;Community Activity;Laundry;Yard Work;Other    Rehab Potential  Good    PT Frequency  2x / week    PT Duration  4 weeks    PT Treatment/Interventions  ADLs/Self Care Home Management;Cryotherapy;Electrical Stimulation;Iontophoresis 4mg /ml Dexamethasone;Moist Heat;Functional mobility training;Therapeutic activities;Therapeutic exercise;Balance training;Neuromuscular re-education;Patient/family education;Manual techniques;Passive range of motion;Joint Manipulations;Spinal Manipulations    PT Next Visit Plan  Continue focus on manual therapy to decrease patient's pain    PT Home Exercise Plan  SLR, bridge, sidelying hip abduction, prone  hip extension (05/12/19 - updated with theraband); 05/18/19 - minisquat, SLS with vecors; 05/22/19: tandem stance by sink; forward/lateral step up; 06/11/19: ITB/SKTC opposite shoulder stretch    Consulted and Agree with  Plan of Care  Patient       Patient will benefit from skilled therapeutic intervention in order to improve the following deficits and impairments:  Decreased activity tolerance, Decreased balance, Decreased mobility, Increased muscle spasms, Impaired flexibility, Pain, Decreased strength, Decreased endurance, Impaired perceived functional ability, Increased fascial restricitons  Visit Diagnosis: 1. Left-sided low back pain without sciatica, unspecified chronicity   2. Muscle weakness (generalized)        Problem List Patient Active Problem List   Diagnosis Date Noted  . Regurgitation and rechewing   . Dysphagia   . GERD (gastroesophageal reflux disease) 11/03/2013  . HTN (hypertension) 11/03/2013  . Hyperlipemia 11/03/2013  . Vomiting 11/03/2013   Clarene Critchley PT, DPT 5:30 PM, 07/03/19 [place] [address] [place], [place], [address] Phone: [phone]   Fax:  [phone]  Name: Dawn Stafford MRN: [id number] Date of Birth: August 23, 1956

## 2019-07-09 ENCOUNTER — Encounter (HOSPITAL_COMMUNITY): Payer: Self-pay | Admitting: Physical Therapy

## 2019-07-09 ENCOUNTER — Other Ambulatory Visit: Payer: Self-pay

## 2019-07-09 ENCOUNTER — Ambulatory Visit (HOSPITAL_COMMUNITY): Payer: PRIVATE HEALTH INSURANCE | Admitting: Physical Therapy

## 2019-07-09 DIAGNOSIS — M545 Low back pain, unspecified: Secondary | ICD-10-CM

## 2019-07-09 DIAGNOSIS — M6281 Muscle weakness (generalized): Secondary | ICD-10-CM

## 2019-07-09 NOTE — Therapy (Signed)
[place], [place], [address] Phone: [phone]   Fax:  [phone]  Physical Therapy Treatment  Patient Details  Name: Dawn Stafford MRN: [id number] Date of Birth: [birth year] Referring Provider (PT): Renette Butters, MD   Encounter Date: 07/09/2019  PT End of Session - 07/09/19 1550    Visit Number  19    Number of Visits  23    Date for PT Re-Evaluation  07/20/19    Authorization Type  Workmans Comp (1-eval and 8 visits approved)    Authorization Time Period  04/29/19-05/29/19; 05/29/19-06/26/19; 06/29/19-07/20/19    Authorization - Visit Number  2    Authorization - Number of Visits  6    PT Start Time  9326    PT Stop Time  1610    PT Time Calculation (min)  40 min    Activity Tolerance  Patient tolerated treatment well    Behavior During Therapy  [facility] for tasks assessed/performed       Past Medical History:  Diagnosis Date  . Acid reflux 11/07/2010  . GERD (gastroesophageal reflux disease)   . Hyperlipidemia   . Hypertension 11/07/2010    Past Surgical History:  Procedure Laterality Date  . [place] STUDY N/A 03/03/2018   Procedure: 24 HOUR PH STUDY (on PPI);  Surgeon: Mauri Pole, MD;  Location: [facility];  Service: Endoscopy;  Laterality: N/A;  . BRAVO [place] STUDY  01/10/2011   [name]  . COLONOSCOPY     04/2013  . ESOPHAGEAL MANOMETRY N/A 03/03/2018   Procedure: ESOPHAGEAL MANOMETRY (EM);  Surgeon: Mauri Pole, MD;  [facility]: [facility];  Service: Endoscopy;  Laterality: N/A;  . ESOPHAGOGASTRODUODENOSCOPY  10/2010  . [place] IMPEDANCE STUDY N/A 03/03/2018   Procedure: PH IMPEDANCE STUDY (on PPI);  Surgeon: Mauri Pole, MD;  Location: [facility];  Service: Endoscopy;  Laterality: N/A;  . TUBAL LIGATION      There were no vitals filed for this visit.  Subjective Assessment - 07/09/19 1533    Subjective  Patient reported 7/10 pain this date in her left hip.    Limitations  House hold activities;Lifting;Standing;Walking    Currently in Pain?  Yes    Pain Score  7     Pain Location  Hip    Pain Orientation  Left    Pain Descriptors / Indicators  Aching    Pain Type  Chronic pain    Pain Onset  More than a month ago                       [facility] Adult PT Treatment/Exercise - 07/09/19 0001      Knee/Hip Exercises: Stretches   ITB Stretch  Left;3 reps;30 seconds    ITB Stretch Limitations  supine with strap    Other Knee/Hip Stretches  Lt LE SKTC 5x 10"    Other Knee/Hip Stretches  Lower trunk rotation: 10'' x 10 each side      Knee/Hip Exercises: Standing   Forward Lunges  Both;10 reps    Forward Lunges Limitations  Cueing for mechanics    Other Standing Knee Exercises  Hip Hike: 1x 10 reps Bil LE standing on 4'' step    Other Standing Knee Exercises  On Airex bilateral shoulder extension with RTB marching 3'' holds x 20 alternating sides      Knee/Hip Exercises: Seated   Other Seated Knee/Hip Exercises  Seated marching over  6'' hurdle 2x10      Manual Therapy   Manual Therapy  Soft tissue mobilization;Joint mobilization    Manual therapy comments  performed seperate from other interventions    Joint Mobilization  Grade II/III long axis distraction of femoracetabular joint 4x 30'' holds for pain relief     Soft tissue mobilization  Rt S/L; IASTM with therastick. Lt hip Lt glute med, TFL and proximal ITB with STM and massage roller               PT Short Term Goals - 06/29/19 1440      PT SHORT TERM GOAL #1   Title  Patient will be independent with HEP, updated PRN, to improve functional strength and activity tolerance.    Time  2    Period  Weeks    Status  Achieved    Target Date  05/13/19      PT SHORT TERM GOAL #2   Title  Patient will demonstrate full pain free motion with lumbar AROM indicating reduced sensitivity to movement to return to prior activities.    Baseline  pt reports some stretching  sensation    Time  2    Period  Weeks    Status  Achieved        PT Long Term Goals - 06/29/19 1441      PT LONG TERM GOAL #1   Title  Patient will demonstrate proper squat form with no pain to demonstrate improve mobility to return to work duties and home activities such as laundray and cleaning.    Time  4    Period  Weeks    Status  On-going      PT LONG TERM GOAL #2   Title  Patient will demonstrate 1 grade improvement in MMT for bil LE's for improved functional LE strength to be able to perform work duties for full 8 hour shifts.    Time  4    Period  Weeks    Status  Achieved      PT LONG TERM GOAL #3   Title  Patient will achieve 20 seconds for SLS on bil LE's to indicate improved balance to be able to safely negotiate stairs with step over step pattern.    Baseline  35 seconds bil LE    Time  4    Period  Weeks    Status  Achieved            Plan - 07/09/19 1627    Clinical Impression Statement  Continued with established POC. This session added seated hip flexion marching over hurdle as well as standing marching with bilateral shoulder extension. Patient required moderate to maximal verbal cues and demonstration with new exercises for proper form. Patient required assistance for cueing and counting with other exercises. Ended session with soft tissue mobilization and joint mobilization to reduce patient's pain.    Personal Factors and Comorbidities  Age;Comorbidity 1    Comorbidities  HTN    Examination-Activity Limitations  Squat;Stand;Sit;Reach Overhead;Stairs    Examination-Participation Restrictions  Cleaning;Community Activity;Laundry;Yard Work;Other    Rehab Potential  Good    PT Frequency  2x / week    PT Duration  4 weeks    PT Treatment/Interventions  ADLs/Self Care Home Management;Cryotherapy;Electrical Stimulation;Iontophoresis 4mg /ml Dexamethasone;Moist Heat;Functional mobility training;Therapeutic activities;Therapeutic exercise;Balance  training;Neuromuscular re-education;Patient/family education;Manual techniques;Passive range of motion;Joint Manipulations;Spinal Manipulations    PT Next Visit Plan  Continue focus on manual therapy to decrease patient's pain  PT Home Exercise Plan  SLR, bridge, sidelying hip abduction, prone hip extension (05/12/19 - updated with theraband); 05/18/19 - minisquat, SLS with vecors; 05/22/19: tandem stance by sink; forward/lateral step up; 06/11/19: ITB/SKTC opposite shoulder stretch    Consulted and Agree with Plan of Care  Patient       Patient will benefit from skilled therapeutic intervention in order to improve the following deficits and impairments:  Decreased activity tolerance, Decreased balance, Decreased mobility, Increased muscle spasms, Impaired flexibility, Pain, Decreased strength, Decreased endurance, Impaired perceived functional ability, Increased fascial restricitons  Visit Diagnosis: 1. Left-sided low back pain without sciatica, unspecified chronicity   2. Muscle weakness (generalized)        Problem List Patient Active Problem List   Diagnosis Date Noted  . Regurgitation and rechewing   . Dysphagia   . GERD (gastroesophageal reflux disease) 11/03/2013  . HTN (hypertension) 11/03/2013  . Hyperlipemia 11/03/2013  . Vomiting 11/03/2013   Clarene Critchley PT, DPT 4:29 PM, 07/09/19 [place], [place], [address] Phone: [phone]   Fax:  [phone]  Name: ELISHEVA FALLAS MRN: [id number] Date of Birth: Sep 16, 1956

## 2019-07-10 ENCOUNTER — Encounter

## 2019-07-13 ENCOUNTER — Telehealth: Payer: Self-pay

## 2019-07-13 DIAGNOSIS — L280 Lichen simplex chronicus: Secondary | ICD-10-CM | POA: Diagnosis not present

## 2019-07-13 NOTE — Telephone Encounter (Signed)
LM for pt to call back to answer Covid-19 screening questions:   Do you now or have you had a fever in the last 14 days?  Do you have any respiratory symptoms of shortness of breath or cough now or in the last 14 days?  Do you have any family members or close contacts with diagnosed or suspected Covid-19 in the past 14 days?  Have you been tested for Covid-19 and found to be positive?

## 2019-07-13 NOTE — Telephone Encounter (Signed)
Pt responded "no" to all screening questions °

## 2019-07-14 ENCOUNTER — Encounter (HOSPITAL_COMMUNITY): Payer: 59

## 2019-07-14 ENCOUNTER — Ambulatory Visit: Payer: 59 | Admitting: Gastroenterology

## 2019-07-14 ENCOUNTER — Encounter: Payer: Self-pay | Admitting: Gastroenterology

## 2019-07-14 VITALS — BP 118/68 | HR 86 | Temp 97.0°F | Ht 66.75 in | Wt 129.0 lb

## 2019-07-14 DIAGNOSIS — R110 Nausea: Secondary | ICD-10-CM | POA: Diagnosis not present

## 2019-07-14 DIAGNOSIS — R1110 Vomiting, unspecified: Secondary | ICD-10-CM

## 2019-07-14 NOTE — Progress Notes (Signed)
HPI :  63y/o femalewith a longstanding history of regurgitation, GERD, chronic nausea / vomiting,here for a follow up visit.  Patient has been taking protonix 40mg  once daily and carafate PRN, as well as pepcid PRN. She has been avoiding NSAIDs. Symptoms ongoing since 2009 when she had a ?cyst removed from her jaw.  She denies much pyrosis, but main complaint is regurgitation following eating. This can come back up instantly or after a few minutes. She occasionally spits contents out, occasionally chews and reswallows, often tastes the same as when she first swallowed it. She denies much dysphagia. She has ongoing chronic nausea all the time, often worse with eating. Weight is stable.   She has had a very extensive workup with multiple providers as outlined below. Numerous regimens have been tried without any improvement. She had a MRI of her brain in light of chronic nausea which showed small vessel ischemic changes but nothing focal otherwise. I told her to take a baby aspirin, she did this and started having nose bleeds, she saw her PCP about it and stopped the aspirin. She also recently had a fasting AM cortisol which was negative, making adrenal insufficiency unlikely. We had also tried a scopolamine patch which did not help her nausea.   Prior workup: MRI brain 05/21/19 - moderate chronic small vessel ischemic changes,  Screened for adrenal insufficiency, AM cortisol 10.5.   GES - normal Ph impedance study 09/16/2015- Demeester score of 21.8, done off PPI  Esophageal manometry 09/07/2015 - normal swallows 7/10, 3/10 failed peristalsis, normal LES resting pressure with normal relaxation Smart pill study 09/08/2015 - normal gastric, small bowel, and colonic transit HIDA scan normal 2017 [place] normal 2017 Prior small bowel follow through, negative for SMA syndrome CT scan 02/2012 - air fluid level in esophagus, ? Dysmotility?, otherwise normal  EGD Dec 2011 - normal CT neck May 2012  Colonsocopy 2013 - normal EGD 04/2013 - in ER for possible impaction?Patient had dilation EGD 12/2017 - normal and no evidence of EoE.  Esophageal manometry testing 02/2018 - very mild GEJ outflow obstruction. No dysmotility otherwise PH impedance testing 02/2018 -  NORMAL - Demeester score of 0.4 on PPI with no correlation of her reflux events to symptoms. 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 We gave her a trial of gabapentin which did not help at all.   Overall summary of regimens try this far: Tried baclofen 10mg  TID- did not help Tried buspar 5mg  BID- did not help Reportedly tried TCAs and reglan- did not help Reported history of SIBO- unclear response to Rifaximin Desipramine - no benefit Gabapentin - no benefit Trial of biofeed back / hypnosis for rumination - no benefit Trial of celexa - no benefit  Most recent workup Ph / impedance testing / esophageal manometry 03/12/18 -Results mostly normal. Very mild GEJ outflow obstruction  pH impedance study is normal.Demeester 0.4, no correlation of symptoms to reflux episodes. EGD 01/01/2018 - normal esophagus, biopsies taken and no evidence of EoE, benign gastric polyps, normal stomach / duodenum      Past Medical History:  Diagnosis Date  . Acid reflux 11/07/2010  . GERD (gastroesophageal reflux disease)   . Hyperlipidemia   . Hypertension 11/07/2010     Past Surgical History:  Procedure Laterality Date  . [place] STUDY N/A 03/03/2018   Procedure: 24 HOUR PH STUDY (on PPI);  Surgeon: Mauri Pole, MD;  Location: [facility];  Service: Endoscopy;  Laterality:  N/A;  . BRAVO [place] STUDY  01/10/2011   [name]  . COLONOSCOPY     04/2013  . ESOPHAGEAL MANOMETRY N/A 03/03/2018   Procedure: ESOPHAGEAL MANOMETRY (EM);  Surgeon: Mauri Pole, MD;  Location: [facility];  Service: Endoscopy;  Laterality: N/A;  . ESOPHAGOGASTRODUODENOSCOPY  10/2010  . [place] IMPEDANCE STUDY  N/A 03/03/2018   Procedure: PH IMPEDANCE STUDY (on PPI);  Surgeon: Mauri Pole, MD;  Location: [facility];  Service: Endoscopy;  Laterality: N/A;  . TUBAL LIGATION     Family History  Problem Relation Age of Onset  . Stroke Mother   . Kidney disease Sister   . Stroke Sister   . Diabetes Sister   . Diabetes Brother   . Kidney failure Brother   . Other Maternal Grandmother        poor circulation  . Hypertension Maternal Grandmother   . Heart disease Maternal Grandmother   . Colon cancer Neg Hx   . Stomach cancer Neg Hx    Social History   Tobacco Use  . Smoking status: Never Smoker  . Smokeless tobacco: Never Used  Substance Use Topics  . Alcohol use: No    Alcohol/week: 0.0 standard drinks  . Drug use: No   Current Outpatient Medications  Medication Sig Dispense Refill  . amLODipine (NORVASC) 5 MG tablet Take 5 mg by mouth daily.    . diclofenac (VOLTAREN) 50 MG EC tablet 1 tablet as needed.    . famotidine (PEPCID) 20 MG tablet Take 20 mg by mouth as needed for heartburn or indigestion.    . pantoprazole (PROTONIX) 40 MG tablet Take 1 tablet by mouth every morning. 90 tablet 3  . simvastatin (ZOCOR) 20 MG tablet Take 20 mg by mouth every evening.     . sucralfate (CARAFATE) 1 g tablet Take 1 tablet (1 g total) by mouth every 6 (six) hours as needed. Slowly dissolve 1 tablet in 1 Tbs of water to make a slurry before taking 90 tablet 1  . Multiple Vitamin (MULTIVITAMIN) tablet 1 tablet. Patient states that she takes for 4 weeks then will stop so that her body can take a break.     Current Facility-Administered Medications  Medication Dose Route Frequency Provider Last Rate Last Dose  . 0.9 %  sodium chloride infusion  500 mL Intravenous Once , Carlota Raspberry, MD       No Known Allergies   Review of Systems: All systems reviewed and negative except where noted in HPI.   Lab Results  Component Value Date   WBC 4.7 04/20/2011   HGB 13.7 04/20/2011   HCT  40.6 04/20/2011   MCV 99.8 04/20/2011   PLT 159 04/20/2011    Lab Results  Component Value Date   CREATININE 0.74 05/21/2019   BUN 24 (H) 04/20/2011   NA 141 04/20/2011   K 2.8 (L) 04/20/2011   CL 99 04/20/2011   CO2 32 04/20/2011    No results found for: ALT, AST, GGT, ALKPHOS, BILITOT  Physical Exam: Ht 5' 6.75" (1.695 m)   Wt 129 lb (58.5 kg)   BMI 20.36 kg/m  Constitutional: Pleasant,well-developed, female in no acute distress. HEENT: Normocephalic and atraumatic. Conjunctivae are normal. No scleral icterus. Neck supple.  Cardiovascular: Normal rate, regular rhythm.  Pulmonary/chest: Effort normal and breath sounds normal. No wheezing, rales or rhonchi. Abdominal: Soft, nondistended, nontender. There are no masses palpable.  Extremities: no edema Lymphadenopathy: No cervical adenopathy noted. Neurological: Alert and oriented  to person place and time. Skin: Skin is warm and dry. No rashes noted. Psychiatric: Normal mood and affect. Behavior is normal.   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 She has not had any improvement with the regimens as outlined. I reassured her the pH test looks okay, PPI is working to control her acid levels. Her most bothersome symptom is postprandial regurgitation. 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 She has had hypnosis for this in the past without benefit. We now have access to diaphragmatic breathing therapy through pelvic floor PT, and I think this is a good option for her to try. I discussed what this is and recommend she try it. 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 Otherwise, chronic nausea without clear cause, no improvement with trial of multiple meds. If she does not improve with diaphragmatic  breathing consider trial of acupuncture, behavioral health evaluation, or tertiary care center evaluation.   [place] Cellar, MD [facility]

## 2019-07-14 NOTE — Patient Instructions (Signed)
If you are age 65 or older, your body mass index should be between 23-30. Your Body mass index is 20.36 kg/m. If this is out of the aforementioned range listed, please consider follow up with your Primary Care Provider.  If you are age 81 or younger, your body mass index should be between 19-25. Your Body mass index is 20.36 kg/m. If this is out of the aformentioned range listed, please consider follow up with your Primary Care Provider.    To help prevent the possible spread of infection to our patients, communities, and staff; we will be implementing the following measures:  As of now we are not allowing any visitors/family members to accompany you to any upcoming appointments with [facility]. If you have any concerns about this please contact our office to discuss prior to the appointment.   We have referred you to Rehabilitation for Diaphragmatic Breathing Therapy. They will contact you to schedule an appointment.  Please let us know if you have not heard from them within a week or two.  Thank you for entrusting me with your care and for choosing [facility], Dr. White Cellar

## 2019-07-15 ENCOUNTER — Ambulatory Visit (HOSPITAL_COMMUNITY): Payer: PRIVATE HEALTH INSURANCE | Admitting: Physical Therapy

## 2019-07-15 ENCOUNTER — Other Ambulatory Visit: Payer: Self-pay

## 2019-07-15 DIAGNOSIS — M6281 Muscle weakness (generalized): Secondary | ICD-10-CM

## 2019-07-15 DIAGNOSIS — M545 Low back pain, unspecified: Secondary | ICD-10-CM

## 2019-07-15 NOTE — Therapy (Signed)
[place], [place], [address] Phone: [phone]   Fax:  [phone]  Physical Therapy Treatment  Patient Details  Name: Dawn Stafford MRN: [id number] Date of Birth: 16-Oct-1956 Referring Provider (PT): Renette Butters, MD   Encounter Date: 07/15/2019  PT End of Session - 07/15/19 1712    Visit Number  20   progress note completed visit #17   Number of Visits  23    Date for PT Re-Evaluation  07/20/19    Authorization Type  [employer] (1-eval and 8 visits approved initially)  last progress note completed visit #17    Authorization Time Period  04/29/19-05/29/19; 05/29/19-06/26/19; 06/29/19-07/20/19    Authorization - Visit Number  3    Authorization - Number of Visits  6    PT Start Time  9937    PT Stop Time  1715    PT Time Calculation (min)  40 min    Activity Tolerance  Patient tolerated treatment well    Behavior During Therapy  [facility] for tasks assessed/performed       Past Medical History:  Diagnosis Date  . Acid reflux 11/07/2010  . GERD (gastroesophageal reflux disease)   . Hyperlipidemia   . Hypertension 11/07/2010    Past Surgical History:  Procedure Laterality Date  . [place] STUDY N/A 03/03/2018   Procedure: 24 HOUR PH STUDY (on PPI);  Surgeon: Mauri Pole, MD;  Location: [facility];  Service: Endoscopy;  Laterality: N/A;  . BRAVO [place] STUDY  01/10/2011   [name]  . COLONOSCOPY     04/2013  . ESOPHAGEAL MANOMETRY N/A 03/03/2018   Procedure: ESOPHAGEAL MANOMETRY (EM);  Surgeon: Mauri Pole, MD;  Location: [facility];  Service: Endoscopy;  Laterality: N/A;  . ESOPHAGOGASTRODUODENOSCOPY  10/2010  . [place] IMPEDANCE STUDY N/A 03/03/2018   Procedure: PH IMPEDANCE STUDY (on PPI);  Surgeon: Mauri Pole, MD;  Location: [facility];  Service: Endoscopy;  Laterality: N/A;  . TUBAL LIGATION      There were no vitals filed for this visit.  Subjective Assessment - 07/15/19  1637    Subjective  pt states her pain remains at 7/10.  States the lowest it gets is 6/10 and thats with medication.    Currently in Pain?  Yes    Pain Score  7     Pain Location  Back    Pain Orientation  Left    Pain Radiating Towards  radiates to posterior lateral mid thigh                       [facility] Adult PT Treatment/Exercise - 07/15/19 0001      Knee/Hip Exercises: Stretches   ITB Stretch  Left;3 reps;30 seconds    ITB Stretch Limitations  supine with strap    Other Knee/Hip Stretches  bil LE SKTC 5x 10"    Other Knee/Hip Stretches  Lower trunk rotation: 10'' x 10 each side      Knee/Hip Exercises: Standing   Forward Lunges  Both;15 reps    Forward Lunges Limitations  Cueing for mechanics and posuring    Functional Squat  2 sets;10 reps    Functional Squat Limitations  chair tapping; cues for wider stance    Stairs  reciprocal pattern 5RT 7in step height, with 1 HR    SLS with Vectors  3 way-vector: 5 sec holds each way (10 eps bil LE). No UE  assist.     Other Standing Knee Exercises  Hip Hike: 2x 15 reps Bil LE standing on 4'' step    Other Standing Knee Exercises  On Airex bilateral shoulder extension with GTB marching 3'' holds x 20 alternating sides   standing march holds 10 reps     Manual Therapy   Manual Therapy  Soft tissue mobilization    Manual therapy comments  performed seperate from other interventions    Soft tissue mobilization  Rt S/L; IASTM with therastick. Lt hip Lt glute med, TFL and proximal ITB with STM and massage roller               PT Short Term Goals - 06/29/19 1440      PT SHORT TERM GOAL #1   Title  Patient will be independent with HEP, updated PRN, to improve functional strength and activity tolerance.    Time  2    Period  Weeks    Status  Achieved    Target Date  05/13/19      PT SHORT TERM GOAL #2   Title  Patient will demonstrate full pain free motion with lumbar AROM indicating reduced sensitivity to  movement to return to prior activities.    Baseline  pt reports some stretching sensation    Time  2    Period  Weeks    Status  Achieved        PT Long Term Goals - 06/29/19 1441      PT LONG TERM GOAL #1   Title  Patient will demonstrate proper squat form with no pain to demonstrate improve mobility to return to work duties and home activities such as laundray and cleaning.    Time  4    Period  Weeks    Status  On-going      PT LONG TERM GOAL #2   Title  Patient will demonstrate 1 grade improvement in MMT for bil LE's for improved functional LE strength to be able to perform work duties for full 8 hour shifts.    Time  4    Period  Weeks    Status  Achieved      PT LONG TERM GOAL #3   Title  Patient will achieve 20 seconds for SLS on bil LE's to indicate improved balance to be able to safely negotiate stairs with step over step pattern.    Baseline  35 seconds bil LE    Time  4    Period  Weeks    Status  Achieved            Plan - 07/15/19 1716    Clinical Impression Statement  continued with established POC with contiued need for heavy cues to complete in correct form and recall established exercises. Pt tends to go into forward posturing with many of her exercises and difficulty stabilizing.  Increased sets/reps of some exercises this session.  No soft tissue restrictions palpated at end of session with manual and no complaints/pain behaviors.  Pain only decreased to 6/10 at end of session.    Personal Factors and Comorbidities  Age;Comorbidity 1    Comorbidities  HTN    Examination-Activity Limitations  Squat;Stand;Sit;Reach Overhead;Stairs    Examination-Participation Restrictions  Cleaning;Community Activity;Laundry;Yard Work;Other    Rehab Potential  Good    PT Frequency  2x / week    PT Duration  4 weeks    PT Treatment/Interventions  ADLs/Self Care Home Management;Cryotherapy;Electrical Stimulation;Iontophoresis 4mg /ml Dexamethasone;Moist Heat;Functional  mobility training;Therapeutic activities;Therapeutic exercise;Balance training;Neuromuscular re-education;Patient/family education;Manual techniques;Passive range of motion;Joint Manipulations;Spinal Manipulations    PT Next Visit Plan  Continue focus on manual therapy to decrease patient's pain.  Increase activity and challenge as able.    PT Home Exercise Plan  SLR, bridge, sidelying hip abduction, prone hip extension (05/12/19 - updated with theraband); 05/18/19 - minisquat, SLS with vecors; 05/22/19: tandem stance by sink; forward/lateral step up; 06/11/19: ITB/SKTC opposite shoulder stretch    Consulted and Agree with Plan of Care  Patient       Patient will benefit from skilled therapeutic intervention in order to improve the following deficits and impairments:  Decreased activity tolerance, Decreased balance, Decreased mobility, Increased muscle spasms, Impaired flexibility, Pain, Decreased strength, Decreased endurance, Impaired perceived functional ability, Increased fascial restricitons  Visit Diagnosis: 1. Muscle weakness (generalized)   2. Left-sided low back pain without sciatica, unspecified chronicity        Problem List Patient Active Problem List   Diagnosis Date Noted  . Regurgitation and rechewing   . Dysphagia   . GERD (gastroesophageal reflux disease) 11/03/2013  . HTN (hypertension) 11/03/2013  . Hyperlipemia 11/03/2013  . Vomiting 11/03/2013   Teena Irani, PTA/CLT [phone]   Teena Irani 07/15/2019, 5:32 PM  [facility] [address] [place], [place], [address] Phone: [phone]   Fax:  [phone]  Name: DANIEL JOHNDROW MRN: [id number] Date of Birth: 1956-12-01

## 2019-07-16 ENCOUNTER — Encounter (HOSPITAL_COMMUNITY): Payer: 59

## 2019-07-17 ENCOUNTER — Ambulatory Visit (HOSPITAL_COMMUNITY): Payer: PRIVATE HEALTH INSURANCE

## 2019-07-17 ENCOUNTER — Other Ambulatory Visit: Payer: Self-pay

## 2019-07-17 ENCOUNTER — Encounter (HOSPITAL_COMMUNITY): Payer: Self-pay

## 2019-07-17 DIAGNOSIS — M545 Low back pain, unspecified: Secondary | ICD-10-CM

## 2019-07-17 DIAGNOSIS — M6281 Muscle weakness (generalized): Secondary | ICD-10-CM

## 2019-07-17 NOTE — Therapy (Signed)
[place], [place], [address] Phone: [phone]   Fax:  [phone]  Physical Therapy Treatment & Discharge Summary  Patient Details  Name: Dawn Stafford MRN: [id number] Date of Birth: 06-28-1956 Referring Provider (PT): Renette Butters, MD   Encounter Date: 07/17/2019  PHYSICAL THERAPY DISCHARGE SUMMARY  Visits from Start of Care: 21  Current functional level related to goals / functional outcomes: Dawn Stafford has been participating in physical therapy for low back and Lt hip pain since 04/29/2019. She initially made some progress in strength, improved her lumbar ROM, and balance. Since her re-assessment on 06/29/19 the patient has not improved in objective measures. She has declined in LE strength base on MMT and in her SLS balance assessment. She has not made any improvements in pain but palpation, resistance, and functional testing does not re-create or exacerbate her pain. It is not appropriate at this time to continue with skilled PT interventions due to lack of progress and ongoing pain that remains unchanged. Encouraged patient to follow up with MD for alternative options to address ongoing impairments. Patient will be discharged from this physical therapy episode.    Remaining deficits: See below details.   Education / Equipment: Educated on [employer] throughout. Patient educated on lack of progress since last re-assessment and need to follow up with MD as no interventions have improved her pain and her strength has decreased since last session.  Plan: Patient agrees to discharge.  Patient goals were partially met. Patient is being discharged due to lack of progress.  ?????      PT End of Session - 07/17/19 1524    Visit Number  21   progress note completed visit #17   Number of Visits  23    Date for PT Re-Evaluation  07/20/19    Authorization Type  [employer] (1-eval and 8 visits approved initially)   last progress note completed visit #17    Authorization Time Period  04/29/19-05/29/19; 05/29/19-06/26/19; 06/29/19-07/20/19    Authorization - Visit Number  4    Authorization - Number of Visits  6    PT Start Time  1524    PT Stop Time  1559    PT Time Calculation (min)  35 min    Activity Tolerance  Patient tolerated treatment well    Behavior During Therapy  WFL for tasks assessed/performed       Past Medical History:  Diagnosis Date  . Acid reflux 11/07/2010  . GERD (gastroesophageal reflux disease)   . Hyperlipidemia   . Hypertension 11/07/2010    Past Surgical History:  Procedure Laterality Date  . [place] STUDY N/A 03/03/2018   Procedure: 24 HOUR PH STUDY (on PPI);  Surgeon: Mauri Pole, MD;  Location: [facility];  Service: Endoscopy;  Laterality: N/A;  . BRAVO [place] STUDY  01/10/2011   [name]  . COLONOSCOPY     04/2013  . ESOPHAGEAL MANOMETRY N/A 03/03/2018   Procedure: ESOPHAGEAL MANOMETRY (EM);  Surgeon: Mauri Pole, MD;  Location: [facility];  Service: Endoscopy;  Laterality: N/A;  . ESOPHAGOGASTRODUODENOSCOPY  10/2010  . [place] IMPEDANCE STUDY N/A 03/03/2018   Procedure: PH IMPEDANCE STUDY (on PPI);  Surgeon: Mauri Pole, MD;  Location: [facility];  Service: Endoscopy;  Laterality: N/A;  . TUBAL LIGATION      There were no vitals filed for this visit.  Subjective Assessment - 07/17/19 1526    Subjective  patietn reports she does her exercises are going well and she does them every otehr day and they help with her pain. Mostly teh exercises is waht helps. 15 - 20 minutes at food store, cook most time get someone help her clean.    Limitations  House hold activities;Lifting;Standing;Walking    How long can you sit comfortably?  not limited    Patient Stated Goals  to have no pain with household and work activities    Currently in Pain?  Yes    Pain Score  7     Pain Location  Back    Pain Orientation  Left    Pain Descriptors / Indicators   Aching         [facility] PT Assessment - 07/17/19 0001      Assessment   Medical Diagnosis  Low Back Pain    Referring Provider (PT)  Renette Butters, MD    Onset Date/Surgical Date  03/06/19    Next MD Visit  August 5th    Prior Therapy  none      Precautions   Precautions  None      Restrictions   Weight Bearing Restrictions  No      Home Environment   Living Environment  --    Living Arrangements  --    Available Help at Discharge  --    Type of Home  --    Home Access  --    Entrance Stairs-Number of Steps  --    [place]  --      Prior Function   Level of [place]  Full time employment    Vocation Requirements  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 Pt with narrow BOS and excessive trunk flexion and limited knee flexion.      Single Leg Stance   Comments  Lt LE = 13, Rt LE = 12   was Lt LE = 40 seconds; Rt LE = 20 sec on 06/29/19     Posture/Postural Control   Posture/Postural Control  No significant limitations      AROM   Overall AROM Comments  Quadrants: Patient with discomfort in Rt flex; Lt ext quadrants    Lumbar Flexion  WFL   pain in Lt  lower back   Lumbar Extension  WFL    Lumbar - Right Side Bend  WFL   pain in Lt  lower back   Lumbar - Left Side Bend  WFL   pain in Lt  lower back     Strength   Right Hip Flexion  4+/5   4    Right Hip Extension  3+/5   3   Right Hip ABduction  4/5   3-   Left Hip Flexion  4/5   4   Left Hip Extension  3+/5   3   Left Hip ABduction  4/5   3-   Right Knee Flexion  4/5   4-   Right Knee Extension  5/5   4   Left Knee Flexion  4/5   4-   Left Knee Extension  5/5   4   Right Ankle Dorsiflexion  5/5   4+   Left Ankle Dorsiflexion  5/5   4+     Ambulation/Gait   Ambulation/Gait  Yes    Ambulation/Gait Assistance  7: Independent    Ambulation Distance (Feet)  404 Feet   2MWT   Assistive device  None    Gait Pattern  Decreased stride length;Decreased hip/knee flexion - right;Decreased hip/knee flexion - left    Ambulation Surface  Level;Indoor    Gait velocity  1.0 m/s        PT Short Term Goals - 07/17/19 1623      PT SHORT TERM GOAL #1   Title  Patient will be independent with HEP, updated PRN, to improve functional strength and activity tolerance.    Time  2    Period  Weeks    Status  Achieved    Target Date  05/13/19      PT SHORT TERM GOAL #2   Title  Patient will demonstrate full pain free motion with lumbar AROM indicating reduced sensitivity to movement to return to prior activities.    Baseline  pt reports some stretching sensation    Time  2    Period  Weeks    Status  Achieved        PT Long Term Goals - 07/17/19 1624      PT LONG TERM GOAL #1   Title  Patient will demonstrate proper squat form with no pain to demonstrate improve mobility to return to work duties and home activities such as laundray and cleaning.    Time  4    Period  Weeks    Status  Not Met      PT LONG TERM GOAL #2   Title  Patient will demonstrate 1 grade improvement in MMT for bil LE's for improved functional LE strength to be able to perform work duties for full 8 hour shifts.    Baseline  07/17/19 - pt had previously met this goal, this date she regressed and had decreased strength for multiple muscle groups throughout LE's compared to last    Time  4    Period   Weeks    Status  Achieved      PT LONG TERM GOAL #3   Title  Patient will achieve 20 seconds for SLS on bil LE's to indicate improved balance to be able to safely negotiate stairs with step over step pattern.    Baseline  07/17/19 - pt previously met this goal but has regressed and had greater difficulty this session as well    Time  4    Period  Weeks    Status  Achieved         Plan - 07/17/19 1629    Clinical Impression Statement  Dawn Stafford has been participating in physical therapy for low back and Lt hip pain since 04/29/2019. She initially made some progress in strength, improved her lumbar ROM, and balance. Since her re-assessment on 06/29/19 the patient has not improved in objective measures.  She has declined in LE strength base on MMT and in her SLS balance assessment. She has not made any improvements in pain but palpation, resistance, and functional testing does not re-create or exacerbate her pain. It is not appropriate at this time to continue with skilled PT interventions due to lack of progress and ongoing pain that remains unchanged. Encouraged patient to follow up with MD for alternative options to address ongoing impairments. Patient will be discharged from this physical therapy episode.    Personal Factors and Comorbidities  Age;Comorbidity 1    Comorbidities  HTN    Examination-Activity Limitations  Squat;Stand;Sit;Reach Overhead;Stairs    Examination-Participation Restrictions  Cleaning;Community Activity;Laundry;Yard Work;Other    Rehab Potential  Good    PT Frequency  2x / week    PT Duration  4 weeks    PT Treatment/Interventions  ADLs/Self Care Home Management;Cryotherapy;Electrical Stimulation;Iontophoresis 88m/ml Dexamethasone;Moist Heat;Functional mobility training;Therapeutic activities;Therapeutic exercise;Balance training;Neuromuscular re-education;Patient/family education;Manual techniques;Passive range of motion;Joint Manipulations;Spinal Manipulations    PT  Next Visit Plan  dischraging for follow up with MD as patient has declined in objective testing and there has been no change in her pain since initiation of therapy.    PT Home Exercise Plan  SLR, bridge, sidelying hip abduction, prone hip extension (05/12/19 - updated with theraband); 05/18/19 - minisquat, SLS with vecors; 05/22/19: tandem stance by sink; forward/lateral step up; 06/11/19: ITB/SKTC opposite shoulder stretch    Consulted and Agree with Plan of Care  Patient       Patient will benefit from skilled therapeutic intervention in order to improve the following deficits and impairments:  Decreased activity tolerance, Decreased balance, Decreased mobility, Increased muscle spasms, Impaired flexibility, Pain, Decreased strength, Decreased endurance, Impaired perceived functional ability, Increased fascial restricitons  Visit Diagnosis: 1. Muscle weakness (generalized)   2. Left-sided low back pain without sciatica, unspecified chronicity        Problem List Patient Active Problem List   Diagnosis Date Noted  . Regurgitation and rechewing   . Dysphagia   . GERD (gastroesophageal reflux disease) 11/03/2013  . HTN (hypertension) 11/03/2013  . Hyperlipemia 11/03/2013  . Vomiting 11/03/2013    RKipp Brood PT, DPT, [facility] Therapist [facility] 07/17/2019 4:54 PM    [address] [address]Phone: [phone]  Fax:  [phone] Name: Dawn VESTMRN: [id number] of Birth: 703-Dec-1957

## 2019-07-21 ENCOUNTER — Ambulatory Visit (HOSPITAL_COMMUNITY): Payer: 59

## 2019-07-22 ENCOUNTER — Encounter: Payer: Self-pay | Admitting: Physical Therapy

## 2019-07-22 ENCOUNTER — Other Ambulatory Visit: Payer: Self-pay

## 2019-07-22 ENCOUNTER — Ambulatory Visit: Payer: 59 | Attending: Gastroenterology | Admitting: Physical Therapy

## 2019-07-22 DIAGNOSIS — R252 Cramp and spasm: Secondary | ICD-10-CM | POA: Insufficient documentation

## 2019-07-22 DIAGNOSIS — M6281 Muscle weakness (generalized): Secondary | ICD-10-CM | POA: Diagnosis not present

## 2019-07-22 DIAGNOSIS — M545 Low back pain: Secondary | ICD-10-CM | POA: Diagnosis not present

## 2019-07-22 DIAGNOSIS — R110 Nausea: Secondary | ICD-10-CM | POA: Diagnosis not present

## 2019-07-22 DIAGNOSIS — R1110 Vomiting, unspecified: Secondary | ICD-10-CM | POA: Insufficient documentation

## 2019-07-22 DIAGNOSIS — R293 Abnormal posture: Secondary | ICD-10-CM | POA: Insufficient documentation

## 2019-07-22 MED FILL — SUCRALFATE 1 GM TABLET: 1 | 23 days supply | Qty: 90 | Fill #1

## 2019-07-22 NOTE — Therapy (Signed)
[facility] [address] [place], [place], [address] Phone: [phone]   Fax:  [phone]  Physical Therapy Evaluation  Patient Details  Name: Dawn Stafford MRN: [id number] Date of Birth: 1956-02-09 Referring Provider (PT): Dr. [place]ia Cellar   Encounter Date: 07/22/2019  PT End of Session - 07/22/19 1432    Visit Number  1    Date for PT Re-Evaluation  08/19/19    Authorization Type  [facility]    Authorization Time Period  --    Authorization - Visit Number  1    Authorization - Number of Visits  20    PT Start Time  1330    PT Stop Time  1420    PT Time Calculation (min)  50 min    Activity Tolerance  Patient tolerated treatment well;No increased pain    Behavior During Therapy  WFL for tasks assessed/performed       Past Medical History:  Diagnosis Date  . Acid reflux 11/07/2010  . GERD (gastroesophageal reflux disease)   . Hyperlipidemia   . Hypertension 11/07/2010    Past Surgical History:  Procedure Laterality Date  . [place] STUDY N/A 03/03/2018   Procedure: 24 HOUR PH STUDY (on PPI);  Surgeon: Mauri Pole, MD;  Location: [facility];  Service: Endoscopy;  Laterality: N/A;  . BRAVO [place] STUDY  01/10/2011   [name]  . COLONOSCOPY     04/2013  . ESOPHAGEAL MANOMETRY N/A 03/03/2018   Procedure: ESOPHAGEAL MANOMETRY (EM);  Surgeon: Mauri Pole, MD;  Location: [facility];  Service: Endoscopy;  Laterality: N/A;  . ESOPHAGOGASTRODUODENOSCOPY  10/2010  . [place] IMPEDANCE STUDY N/A 03/03/2018   Procedure: PH IMPEDANCE STUDY (on PPI);  Surgeon: Mauri Pole, MD;  Location: [facility];  Service: Endoscopy;  Laterality: N/A;  . TUBAL LIGATION      There were no vitals filed for this visit.   Subjective Assessment - 07/22/19 1335    Subjective  Patient reports 2009 just before Thanksgiving. I had teeth removed and when woke up from anesthia her food would come up and difficulty  with smell. NO issues with constipation.    How long can you sit comfortably?  not limited    How long can you stand comfortably?  not limited    How long can you walk comfortably?  not limited    Patient Stated Goals  Improve digestion of food and regurtation    Currently in Pain?  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)   hours are currently 11am-7pm   Leisure  Pt has enjoyed getting out to walk about 35-45 minutes but is limited by work schedule sometimes      Cognition   Overall Cognitive Status  Within Functional Limits for tasks assessed      Posture/Postural Control   Posture/Postural Control  No significant limitations      ROM / Strength   AROM / PROM / Strength  AROM;PROM;Strength      AROM   Overall AROM Comments  lumbar ROM decreased by 25%      Strength   Overall Strength Comments  bilateral shoulder strength is 4/5; when tighten abdominals will contract upper instead of lower, , decreased posterior rib cage movement      Palpation   Palpation comment  no bucket handle motion of lower rib cage with breath, when breaths in the chest rises,  tightness in the diaphragm, upper abdominas                Objective measurements completed on examination: See above findings.    Pelvic Floor Special Questions - 07/22/19 0001    Urinary Leakage  No       [facility] Adult PT Treatment/Exercise - 07/22/19 0001      Therapeutic Activites    Therapeutic Activities  Lifting    ADL's  sitting upright to eat, chew her food completely, place a fork down after food in mouth to give time to chew and digest, sit to stand with flexing at hips and keeping spine erect    Lifting  squat lift, diagonal lift  with flexing at hips and not at waist to expand the respiratory diaphragm and reduce food coming up      Neuro Re-ed    Neuro Re-ed Details   diaphragmatic breathing in supine with tactile cues to reduce movement of the chest and focus with air going into her abdomen      Manual Therapy   Manual Therapy  Soft tissue mobilization;Myofascial release    Myofascial Release  release of the respiratory diaphragm, soft tissue work to the diaphgram, circular motion around the abdomen to release the tissue so patient is able to expand the upper abdomen instead of lower             PT Education - 07/22/19 1432    Education Details  Access Code: [email or web address]; lifting techniques, eating food with upright posture and chew food all the way    Person(s) Educated  Patient    Methods  Explanation;Demonstration;Handout    Comprehension  Verbalized understanding;Returned demonstration       PT Short Term Goals - 07/22/19 1445      PT SHORT TERM GOAL #1   Title  ---      PT SHORT TERM GOAL #2   Title  ---    Baseline  ---        PT Long Term Goals - 07/22/19 1445      PT LONG TERM GOAL #1   Title  able to demonstrate correct body mechanics to lift items with flexing at the hips instead of the waist to reduce regurgitation    Time  4    Period  Weeks    Status  New    Target Date  08/19/19  PT LONG TERM GOAL #2   Title  able to  perform diaphragmatic breathing to relax the digestive system to assist food to stay in the stomach    Baseline  ---    Time  4    Period  Weeks    Status  New    Target Date  08/19/19      PT LONG TERM GOAL #3   Title  able to demonstrate ways to eat upright and chew her food fully to improve digestion and reduce regurtitation    Baseline  ---    Time  4    Period  Weeks    Status  New    Target Date  08/19/19      PT LONG TERM GOAL #4   Title  able to go from sit to stand without hands and no flexion of the trunk after dinner to reduce regurgitation    Time  4    Period  Weeks    Status  New    Target Date  08/19/19             Plan - 07/22/19 1434    Clinical Impression Statement  Patient is a 63 year old female with 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 Patient reports this has happened after she has had teeth removed. Patient has decreased mobility of posterior rib cage movment and bucket handle movement. Patient has tightness in the diaphgram and upper abdomen. Patient breaths with only chest moving up. When patient contracts her abdomen, she will only contract the upper abdomen. Patient reports she will regurgitate after she eats and stands, lifting, and bending over. When patient does these tasks she will flex at the waist crunching down on the upper abdomen instead of at the hips to elongate the trunk. Patient will benefit from skilled therapy to improve fascia restrictions of the diaphragm, open up the rib cage, improve diaphragmatic breathing, eating techniques and body mechanics to reduce regurgitation.    Personal Factors and Comorbidities  Age;Profession    Examination-Activity Limitations  Bend;Sit;Lift    Stability/Clinical Decision Making  Stable/Uncomplicated    Clinical Decision Making  Low    Rehab Potential  Excellent    PT Frequency  1x / week    PT Duration  4 weeks    PT Treatment/Interventions  ADLs/Self Care Home  Management;Moist Heat;Therapeutic activities;Therapeutic exercise;Patient/family education;Neuromuscular re-education;Manual techniques    PT Next Visit Plan  review liftigng techniques, work on releasing the diaphgram, improve rib mobility, see if eating slow and chewing food helps    PT Home Exercise Plan  Access Code: [email or web address]    Consulted and Agree with Plan of Care  Patient       Patient will benefit from skilled therapeutic intervention in order to improve the following deficits and impairments:  Decreased range of motion, Increased fascial restricitons, Increased muscle spasms, Decreased strength, Improper body mechanics  Visit Diagnosis: 1. Cramp and spasm   2. Abnormal posture   3. 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 Regurgitation and rechewing   . Dysphagia   . GERD (gastroesophageal reflux disease) 11/03/2013  . HTN (hypertension) 11/03/2013  . Hyperlipemia 11/03/2013  . Vomiting 11/03/2013    Earlie Counts, PT 07/22/19 2:52 PM   [facility] [address], [place], [place], [address] Phone:  [phone]   Fax:  [phone]  Name: TYLISHA DANIS MRN: [id number] Date of Birth: 03/14/1956

## 2019-07-22 NOTE — Patient Instructions (Signed)
Access Code: [email or web address]  URL: [email or web address]  Date: 07/22/2019  Prepared by: Earlie Counts   Exercises Supine Diaphragmatic Breathing - 10 reps - 1 sets - 3x daily - 7x weekly Sit to Stand - 10 reps - 1 sets - 1x daily - 7x weekly Patient Education Lifting Techniques [facility] [address] [place], [place] [address] Phone # [phone] Fax [phone]

## 2019-07-28 ENCOUNTER — Ambulatory Visit (HOSPITAL_COMMUNITY): Payer: 59

## 2019-07-29 ENCOUNTER — Ambulatory Visit: Payer: 59 | Admitting: Physical Therapy

## 2019-07-29 ENCOUNTER — Other Ambulatory Visit: Payer: Self-pay

## 2019-07-29 ENCOUNTER — Encounter: Payer: Self-pay | Admitting: Physical Therapy

## 2019-07-29 DIAGNOSIS — R293 Abnormal posture: Secondary | ICD-10-CM | POA: Diagnosis not present

## 2019-07-29 DIAGNOSIS — R252 Cramp and spasm: Secondary | ICD-10-CM | POA: Diagnosis not present

## 2019-07-29 DIAGNOSIS — R1110 Vomiting, unspecified: Secondary | ICD-10-CM | POA: Diagnosis not present

## 2019-07-29 DIAGNOSIS — M6281 Muscle weakness (generalized): Secondary | ICD-10-CM | POA: Diagnosis not present

## 2019-07-29 NOTE — Therapy (Signed)
[facility] [address], [place], [place], [address] Phone: [phone]   Fax:  [phone]  Physical Therapy Treatment  Patient Details  Name: Dawn Stafford MRN: [id number] Date of Birth: [birth year] Referring Provider (PT): Dr. Raeford Cellar   Encounter Date: 07/29/2019  PT End of Session - 07/29/19 1543    Visit Number  2    Date for PT Re-Evaluation  08/19/19    Authorization Type  UMR    Authorization - Visit Number  2    Authorization - Number of Visits  20    PT Start Time  1500    PT Stop Time  6606    PT Time Calculation (min)  38 min    Activity Tolerance  Patient tolerated treatment well;No increased pain    Behavior During Therapy  WFL for tasks assessed/performed       Past Medical History:  Diagnosis Date  . Acid reflux 11/07/2010  . GERD (gastroesophageal reflux disease)   . Hyperlipidemia   . Hypertension 11/07/2010    Past Surgical History:  Procedure Laterality Date  . [place] STUDY N/A 03/03/2018   Procedure: 24 HOUR PH STUDY (on PPI);  Surgeon: Mauri Pole, MD;  Location: [facility];  Service: Endoscopy;  Laterality: N/A;  . BRAVO [place] STUDY  01/10/2011   Dawn Stafford  . COLONOSCOPY     04/2013  . ESOPHAGEAL MANOMETRY N/A 03/03/2018   Procedure: ESOPHAGEAL MANOMETRY (EM);  Surgeon: Mauri Pole, MD;  Location: [facility];  Service: Endoscopy;  Laterality: N/A;  . ESOPHAGOGASTRODUODENOSCOPY  10/2010  . [place] IMPEDANCE STUDY N/A 03/03/2018   Procedure: PH IMPEDANCE STUDY (on PPI);  Surgeon: Mauri Pole, MD;  Location: [facility];  Service: Endoscopy;  Laterality: N/A;  . TUBAL LIGATION      There were no vitals filed for this visit.  Subjective Assessment - 07/29/19 1506    Subjective  No changes since evaluation. I have been chewing my food well but takes time due to not having all my back teeth.    Limitations  House hold  activities;Lifting;Standing;Walking    How long can you sit comfortably?  not limited    How long can you stand comfortably?  not limited    How long can you walk comfortably?  not limited    Patient Stated Goals  Improve digestion of food and regurtation    Currently in Pain?  No/denies                       [facility] Adult PT Treatment/Exercise - 07/29/19 0001      Neuro Re-ed    Neuro Re-ed Details   diaphragmatic breathing with a box on her stomach for tactile cues, verbal cues, manual tactile cues, VC to breath in through her mouth      Manual Therapy   Manual Therapy  Myofascial release    Myofascial Release  release of the respiratory diahragm, thoracic inlet diaphragm, and the urogenital diaphragm             PT Education - 07/29/19 1541    Education Details  continued to reiew diaphragmatic breathing    Person(s) Educated  Patient    Methods  Explanation;Demonstration    Comprehension  Verbalized understanding;Returned demonstration       PT Short Term Goals - 07/22/19 1445      PT SHORT TERM GOAL #1   Title  ---  PT SHORT TERM GOAL #2   Title  ---    Baseline  ---        PT Long Term Goals - 07/22/19 1445      PT LONG TERM GOAL #1   Title  able to demonstrate correct body mechanics to lift items with flexing at the hips instead of the waist to reduce regurgitation    Time  4    Period  Weeks    Status  New    Target Date  08/19/19      PT LONG TERM GOAL #2   Title  able to perform diaphragmatic breathing to relax the digestive system to assist food to stay in the stomach    Baseline  ---    Time  4    Period  Weeks    Status  New    Target Date  08/19/19      PT LONG TERM GOAL #3   Title  able to demonstrate ways to eat upright and chew her food fully to improve digestion and reduce regurtitation    Baseline  ---    Time  4    Period  Weeks    Status  New    Target Date  08/19/19      PT LONG TERM GOAL #4   Title  able  to go from sit to stand without hands and no flexion of the trunk after dinner to reduce regurgitation    Time  4    Period  Weeks    Status  New    Target Date  08/19/19            Plan - 07/29/19 1507    Clinical Impression Statement  Patient continues to require many types of cues to do diaphragmatic breathing. Patient is only breathing into her abdomen a little and does better with breathing into her nose keeping her mouth closed. Patient is able to perform sit to stand with flexing at her hips today. Patient will benefit from skilled therapy to improve fascia restrictions of the diaphram, open up the rib cage, imporve diaphragmatic breathing and body mechanics to reduce regurgitation.    Personal Factors and Comorbidities  Age;Profession    Comorbidities  HTN    Examination-Activity Limitations  Bend;Sit;Lift    Examination-Participation Restrictions  Cleaning;Community Activity;Laundry;Yard Work;Other    Stability/Clinical Decision Making  Stable/Uncomplicated    Rehab Potential  Excellent    PT Frequency  1x / week    PT Duration  4 weeks    PT Treatment/Interventions  ADLs/Self Care Home Management;Moist Heat;Therapeutic activities;Therapeutic exercise;Patient/family education;Neuromuscular re-education;Manual techniques    PT Next Visit Plan  review liftigng techniques, work on releasing the diaphgram, improve rib mobility    PT Home Exercise Plan  Access Code: [email or web address]    Recommended Other Services  MD signed initial eval    Consulted and Agree with Plan of Care  Patient       Patient will benefit from skilled therapeutic intervention in order to improve the following deficits and impairments:  Decreased range of motion, Increased fascial restricitons, Increased muscle spasms, Decreased strength, Improper body mechanics  Visit Diagnosis: 1. Cramp and spasm   2. Abnormal posture   3. 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 Regurgitation and rechewing   . Dysphagia   . GERD (gastroesophageal reflux disease) 11/03/2013  .  HTN (hypertension) 11/03/2013  . Hyperlipemia 11/03/2013  . Vomiting 11/03/2013    Earlie Counts, PT 07/29/19 3:47 PM   [facility] [address] [place], [place], [address] Phone: [phone]   Fax:  [phone]  Name: Dawn Stafford MRN: [id number] Date of Birth: 1956-10-18

## 2019-08-04 ENCOUNTER — Encounter (HOSPITAL_COMMUNITY): Payer: 59

## 2019-08-05 ENCOUNTER — Other Ambulatory Visit: Payer: Self-pay

## 2019-08-05 ENCOUNTER — Encounter: Payer: Self-pay | Admitting: Physical Therapy

## 2019-08-05 ENCOUNTER — Ambulatory Visit: Payer: 59 | Admitting: Physical Therapy

## 2019-08-05 DIAGNOSIS — R293 Abnormal posture: Secondary | ICD-10-CM

## 2019-08-05 DIAGNOSIS — R1110 Vomiting, unspecified: Secondary | ICD-10-CM

## 2019-08-05 DIAGNOSIS — M6281 Muscle weakness (generalized): Secondary | ICD-10-CM | POA: Diagnosis not present

## 2019-08-05 DIAGNOSIS — R252 Cramp and spasm: Secondary | ICD-10-CM | POA: Diagnosis not present

## 2019-08-05 NOTE — Therapy (Signed)
[facility] [address], [place], [place], [address] Phone: [phone]   Fax:  [phone]  Physical Therapy Treatment  Patient Details  Name: Dawn Stafford MRN: [id number] Date of Birth: 02-12-1956 Referring Provider (PT): Dr. Humphrey Cellar   Encounter Date: 08/05/2019  PT End of Session - 08/05/19 1209    Visit Number  3    Date for PT Re-Evaluation  08/19/19    Authorization Type  UMR    PT Start Time  1200    PT Stop Time  1238    PT Time Calculation (min)  38 min    Activity Tolerance  Patient tolerated treatment well;No increased pain    Behavior During Therapy  WFL for tasks assessed/performed       Past Medical History:  Diagnosis Date  . Acid reflux 11/07/2010  . GERD (gastroesophageal reflux disease)   . Hyperlipidemia   . Hypertension 11/07/2010    Past Surgical History:  Procedure Laterality Date  . [place] STUDY N/A 03/03/2018   Procedure: 24 HOUR PH STUDY (on PPI);  Surgeon: Mauri Pole, MD;  Location: [facility];  Service: Endoscopy;  Laterality: N/A;  . BRAVO [place] STUDY  01/10/2011   [name]  . COLONOSCOPY     04/2013  . ESOPHAGEAL MANOMETRY N/A 03/03/2018   Procedure: ESOPHAGEAL MANOMETRY (EM);  Surgeon: Mauri Pole, MD;  Location: [facility];  Service: Endoscopy;  Laterality: N/A;  . ESOPHAGOGASTRODUODENOSCOPY  10/2010  . [place] IMPEDANCE STUDY N/A 03/03/2018   Procedure: PH IMPEDANCE STUDY (on PPI);  Surgeon: Mauri Pole, MD;  Location: [facility];  Service: Endoscopy;  Laterality: N/A;  . TUBAL LIGATION      There were no vitals filed for this visit.  Subjective Assessment - 08/05/19 1206    Subjective  No change at this time. I have had this for a long time. When I lay on my left side and back I feel the gas. I walk for 15 to 20 minutes per day.    Limitations  House hold activities;Lifting;Standing;Walking    How long can you sit comfortably?   not limited    How long can you stand comfortably?  not limited    How long can you walk comfortably?  not limited    Patient Stated Goals  Improve digestion of food and regurtation    Currently in Pain?  No/denies                       [facility] Adult PT Treatment/Exercise - 08/05/19 0001      Neuro Re-ed    Neuro Re-ed Details   educated and performed mindfulness while performing myofascial release and performed body scanning whiel giving patient a handout      Manual Therapy   Manual Therapy  Myofascial release    Myofascial Release  release of the respiratory diahragm, thoracic inlet diaphragm, and the urogenital diaphragm             PT Education - 08/05/19 1237    Education Details  instruction on mindfulness and body scanning to relax her system while she feels the regurgtation    Person(s) Educated  Patient    Methods  Explanation;Demonstration;Handout    Comprehension  Verbalized understanding;Returned demonstration       PT Short Term Goals - 07/22/19 1445      PT SHORT TERM GOAL #1   Title  ---  PT SHORT TERM GOAL #2   Title  ---    Baseline  ---        PT Long Term Goals - 07/22/19 1445      PT LONG TERM GOAL #1   Title  able to demonstrate correct body mechanics to lift items with flexing at the hips instead of the waist to reduce regurgitation    Time  4    Period  Weeks    Status  New    Target Date  08/19/19      PT LONG TERM GOAL #2   Title  able to perform diaphragmatic breathing to relax the digestive system to assist food to stay in the stomach    Baseline  ---    Time  4    Period  Weeks    Status  New    Target Date  08/19/19      PT LONG TERM GOAL #3   Title  able to demonstrate ways to eat upright and chew her food fully to improve digestion and reduce regurtitation    Baseline  ---    Time  4    Period  Weeks    Status  New    Target Date  08/19/19      PT LONG TERM GOAL #4   Title  able to go from sit to  stand without hands and no flexion of the trunk after dinner to reduce regurgitation    Time  4    Period  Weeks    Status  New    Target Date  08/19/19            Plan - 08/05/19 1209    Clinical Impression Statement  Patient is able to perform diaphragmatic breathing with greater ease. Patient has increased fascial releases in the thoracic inlet and diaphragm. Patient has learned mindfulness and body scanning to see if this will calm the system down to relax when she feels her food regurgitating. Patient will benefit from skilled therapy to improve fascia restrictions of the diaphragm, open up the rib cage, improv diaphragmatic breathing and body mechanics to reduce regurgitation.    Personal Factors and Comorbidities  Age;Profession    Comorbidities  HTN    Examination-Activity Limitations  Bend;Sit;Lift    Examination-Participation Restrictions  Cleaning;Community Activity;Laundry;Yard Work;Other    Stability/Clinical Decision Making  Stable/Uncomplicated    Rehab Potential  Excellent    PT Frequency  1x / week    PT Duration  4 weeks    PT Treatment/Interventions  ADLs/Self Care Home Management;Moist Heat;Therapeutic activities;Therapeutic exercise;Patient/family education;Neuromuscular re-education;Manual techniques    PT Next Visit Plan  review liftigng techniques, work on releasing the diaphgram, improve rib mobility, review medtation techniques    PT Home Exercise Plan  Access Code: [email or web address]    Consulted and Agree with Plan of Care  Patient       Patient will benefit from skilled therapeutic intervention in order to improve the following deficits and impairments:  Decreased range of motion, Increased fascial restricitons, Increased muscle spasms, Decreased strength, Improper body mechanics  Visit Diagnosis: 1. Cramp and spasm   2. Abnormal posture   3. 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 Regurgitation and rechewing   . Dysphagia   . GERD (gastroesophageal reflux disease) 11/03/2013  . HTN (hypertension) 11/03/2013  . Hyperlipemia 11/03/2013  .  Vomiting 11/03/2013    Earlie Counts, PT 08/05/19 12:43 PM   [facility] [address], [place], [place], [address] Phone: [phone]   Fax:  [phone]  Name: KEIRSTYN AYDT MRN: [id number] Date of Birth: 1956-12-12

## 2019-08-12 ENCOUNTER — Encounter: Payer: Self-pay | Admitting: Physical Therapy

## 2019-08-12 ENCOUNTER — Other Ambulatory Visit: Payer: Self-pay

## 2019-08-12 ENCOUNTER — Ambulatory Visit: Payer: 59 | Admitting: Physical Therapy

## 2019-08-12 DIAGNOSIS — R252 Cramp and spasm: Secondary | ICD-10-CM

## 2019-08-12 DIAGNOSIS — M6281 Muscle weakness (generalized): Secondary | ICD-10-CM | POA: Diagnosis not present

## 2019-08-12 DIAGNOSIS — R293 Abnormal posture: Secondary | ICD-10-CM

## 2019-08-12 DIAGNOSIS — R1110 Vomiting, unspecified: Secondary | ICD-10-CM

## 2019-08-12 NOTE — Therapy (Signed)
[facility] [address] [place], [place], [address] Phone: [phone]   Fax:  [phone]  Physical Therapy Treatment  Patient Details  Name: Dawn Stafford MRN: [id number] Date of Birth: [birth year] Referring Provider (PT): Dr. [place] Cellar   Encounter Date: 08/12/2019  PT End of Session - 08/12/19 1151    Visit Number  4    Date for PT Re-Evaluation  08/19/19    Authorization Type  UMR    Authorization - Visit Number  3    Authorization - Number of Visits  20    PT Start Time  7253    PT Stop Time  6644    PT Time Calculation (min)  33 min    Activity Tolerance  Patient tolerated treatment well;No increased pain    Behavior During Therapy  WFL for tasks assessed/performed       Past Medical History:  Diagnosis Date  . Acid reflux 11/07/2010  . GERD (gastroesophageal reflux disease)   . Hyperlipidemia   . Hypertension 11/07/2010    Past Surgical History:  Procedure Laterality Date  . [place] STUDY N/A 03/03/2018   Procedure: 24 HOUR PH STUDY (on PPI);  Surgeon: Mauri Pole, MD;  Location: [facility];  Service: Endoscopy;  Laterality: N/A;  . BRAVO [place] STUDY  01/10/2011   Dawn Stafford  . COLONOSCOPY     04/2013  . ESOPHAGEAL MANOMETRY N/A 03/03/2018   Procedure: ESOPHAGEAL MANOMETRY (EM);  Surgeon: Mauri Pole, MD;  Location: [facility];  Service: Endoscopy;  Laterality: N/A;  . ESOPHAGOGASTRODUODENOSCOPY  10/2010  . [place] IMPEDANCE STUDY N/A 03/03/2018   Procedure: PH IMPEDANCE STUDY (on PPI);  Surgeon: Mauri Pole, MD;  Location: [facility];  Service: Endoscopy;  Laterality: N/A;  . TUBAL LIGATION      There were no vitals filed for this visit.  Subjective Assessment - 08/12/19 1118    Subjective  I do  the techniques I have learned and they help some. I have gurgling. I feel it come out when I bend over, inhale strong cleaning smell, sit up. When I lay down it is not  as bad. I am not feeling my normal self yet. I feel a little bit better.    Limitations  House hold activities;Lifting;Standing;Walking    How long can you sit comfortably?  not limited    How long can you stand comfortably?  not limited    How long can you walk comfortably?  not limited    Patient Stated Goals  Improve digestion of food and regurtation    Currently in Pain?  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)   hours are currently 11am-7pm   Leisure  Pt has enjoyed getting out to walk about 35-45 minutes but is limited by work schedule sometimes      Cognition   Overall Cognitive Status  Within Functional Limits for tasks assessed      Posture/Postural Control   Posture/Postural Control  No significant limitations      AROM   Overall AROM Comments  lumbar ROM  decreased by 25%      Strength   Overall Strength Comments  bilateral shoulder strength is 4/5; when tighten abdominals will contract upper instead of lower, , decreased posterior rib cage movement                   [facility] Adult PT Treatment/Exercise - 08/12/19 0001      Self-Care   Self-Care  Other Self-Care Comments    Other Self-Care Comments   lay on left side to help with gas pain      Neuro Re-ed    Neuro Re-ed Details   diaphragmatic breathing with a box on her stomach for tactile cues, verbal cues, manual tactile cues, VC to breath in through her mouth      Manual Therapy   Manual Therapy  Myofascial release    Myofascial Release  release of the respiratory diahragm, thoracic inlet diaphragm, and the urogenital diaphragm             PT Education - 08/12/19 1151    Education Details  reviewed HEP    Person(s) Educated  Patient    Methods  Explanation;Demonstration    Comprehension  Verbalized understanding;Returned demonstration       PT Short Term Goals - 07/22/19 1445      PT SHORT TERM GOAL #1   Title  ---      PT SHORT TERM GOAL #2   Title  ---    Baseline  ---        PT Long Term Goals - 08/12/19 1152      PT LONG TERM GOAL #1   Title  able to demonstrate correct body mechanics to lift items with flexing at the hips instead of the waist to reduce regurgitation    Time  4    Period  Weeks    Status  Achieved      PT LONG TERM GOAL #2   Title  able to perform diaphragmatic breathing to relax the digestive system to assist food to stay in the stomach    Time  4    Period  Weeks    Status  Achieved      PT LONG TERM GOAL #3   Title  able to demonstrate ways to eat upright and  chew her food fully to improve digestion and reduce regurtitation    Time  4    Period  Weeks    Status  Achieved      PT LONG TERM GOAL #4   Title  able to go from sit to stand without hands and no flexion of the trunk after dinner to reduce regurgitation     Time  4    Period  Weeks    Status  Achieved            Plan - 08/12/19 1153    Clinical Impression Statement  Patient is independent with her HEP. Patient is doing the diaphragmatic breathing, body scanning, and correct body mechanics to perform her tasks to reduce the regurgitation. Patient reports the techniques have decreased the symptoms some but not all. Patient has improved movement of the lower rib cage. Patient understands to chew her food fully for better digestion.    Personal Factors and Comorbidities  Age;Profession    Comorbidities  HTN    Examination-Activity Limitations  Bend;Sit;Lift    Examination-Participation Restrictions  Cleaning;Community Activity;Laundry;Yard Work;Other    Stability/Clinical Decision Making  Stable/Uncomplicated    Rehab Potential  Excellent    PT Treatment/Interventions  ADLs/Self Care Home Management;Moist Heat;Therapeutic activities;Therapeutic exercise;Patient/family education;Neuromuscular re-education;Manual techniques    PT Next Visit Plan  Discharge to HEP    PT Home Exercise Plan  Access Code: [email or web address]    Consulted and Agree with Plan of Care  Patient       Patient will benefit from skilled therapeutic intervention in order to improve the following deficits and impairments:  Decreased range of motion, Increased fascial restricitons, Increased muscle spasms, Decreased strength, Improper body mechanics  Visit Diagnosis: Cramp and spasm  Abnormal posture  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 Regurgitation and rechewing   . Dysphagia   . GERD (gastroesophageal reflux disease) 11/03/2013  . HTN (hypertension) 11/03/2013  . Hyperlipemia 11/03/2013  . Vomiting 11/03/2013    Earlie Counts, PT 08/12/19 11:57 AM   [facility] [address], [place], [place], [address] Phone: [phone]   Fax:  [phone]  Name: Dawn Stafford MRN: [id number] Date of Birth: February 23, 1956  PHYSICAL THERAPY DISCHARGE SUMMARY  Visits from Start of Care: 3  Current functional level related to goals / functional outcomes: See above.    Remaining deficits: See above. Patient regurgitation has been helped a little.    Education / Equipment: HEP Plan: Patient agrees to discharge.  Patient goals were met. Patient is being discharged due to meeting the stated rehab goals.  Thank you for the referral. Earlie Counts, PT 08/12/19 11:57 AM  ?????

## 2019-08-17 ENCOUNTER — Other Ambulatory Visit (HOSPITAL_COMMUNITY): Payer: Self-pay | Admitting: Internal Medicine

## 2019-08-17 DIAGNOSIS — Z1231 Encounter for screening mammogram for malignant neoplasm of breast: Secondary | ICD-10-CM

## 2019-08-20 ENCOUNTER — Other Ambulatory Visit: Payer: Self-pay | Admitting: Gastroenterology

## 2019-08-20 MED FILL — SUCRALFATE 1 GM TABLET: 1 | 22 days supply | Qty: 90 | Fill #0

## 2019-08-20 MED FILL — SIMVASTATIN 20 MG TABLET: 20 | 90 days supply | Qty: 90 | Fill #0

## 2019-08-25 ENCOUNTER — Telehealth: Payer: Self-pay | Admitting: Gastroenterology

## 2019-08-25 NOTE — Telephone Encounter (Signed)
Pt stated that she no longer works for [employer] and requested to transfer pharmacy to [facility] in [place].  Please update this.

## 2019-08-25 NOTE — Telephone Encounter (Signed)
Pharmacy changed to [facility] in [place].

## 2019-08-26 ENCOUNTER — Ambulatory Visit (HOSPITAL_COMMUNITY): Payer: 59

## 2019-09-22 ENCOUNTER — Other Ambulatory Visit: Payer: Self-pay | Admitting: Gastroenterology

## 2019-09-22 MED ORDER — PANTOPRAZOLE SODIUM 40 MG PO TBEC: DELAYED_RELEASE_TABLET | ORAL | 1 refills | Status: DC

## 2019-09-22 MED ORDER — SUCRALFATE 1 G PO TABS: ORAL_TABLET | ORAL | 1 refills | Status: DC

## 2019-09-22 NOTE — Telephone Encounter (Signed)
Pt needs rf for protonix and sulcrafate sent to [facility] in [place].

## 2019-09-22 NOTE — Telephone Encounter (Signed)
Rx's sent to [facility]. 

## 2019-11-07 ENCOUNTER — Other Ambulatory Visit: Payer: Self-pay | Admitting: Gastroenterology

## 2019-11-19 ENCOUNTER — Ambulatory Visit (INDEPENDENT_AMBULATORY_CARE_PROVIDER_SITE_OTHER): Payer: Medicare HMO | Admitting: Otolaryngology

## 2019-11-23 ENCOUNTER — Other Ambulatory Visit: Payer: Self-pay

## 2019-11-23 ENCOUNTER — Ambulatory Visit (HOSPITAL_COMMUNITY)
Admission: RE | Admit: 2019-11-23 | Discharge: 2019-11-23 | Disposition: A | Discharge status: Home / Self Care | Payer: Medicare HMO | Source: Ambulatory Visit | Attending: Internal Medicine | Admitting: Internal Medicine

## 2019-11-23 DIAGNOSIS — Z1231 Encounter for screening mammogram for malignant neoplasm of breast: Secondary | ICD-10-CM | POA: Diagnosis not present

## 2019-11-27 ENCOUNTER — Encounter: Payer: Self-pay | Admitting: Gastroenterology

## 2019-11-27 ENCOUNTER — Ambulatory Visit (INDEPENDENT_AMBULATORY_CARE_PROVIDER_SITE_OTHER): Payer: Medicare HMO | Admitting: Gastroenterology

## 2019-11-27 VITALS — BP 128/68 | HR 68 | Temp 97.2°F | Ht 66.75 in | Wt 132.0 lb

## 2019-11-27 DIAGNOSIS — R1110 Vomiting, unspecified: Secondary | ICD-10-CM

## 2019-11-27 NOTE — Progress Notes (Signed)
HPI :  63 year old female here for a follow-up visit.  She has a longstanding history of regurgitation, GERD, chronic nausea.  She has had an extensive evaluation as outlined below.  She is seen at least 5 or 6 gastroenterologist in the past 10 years for symptoms.  Her main symptom is intermittent regurgitation.  This can occur following eating, can come back instantly or after few minutes and occasionally spits out contents.  She denies any dysphagia at baseline.  She has chronic nausea.  She is currently taking Protonix 40 mg once a day and Carafate as needed as well as Pepcid as needed.  She thinks the Protonix does help take the edge off a little bit but it certainly does not prevent any of her symptoms.  No weight loss.  Please see below for exhaustive work-up and trial of numerous regimens for her in the past, none of which she states have helped at all.  She has had hypnosis therapy in the past for possible rumination which did not help.  I referred her for diaphragmatic breathing after our last visit and she completed a course of this.  Her therapist appears to note in the chart that this helped her, however she states " it did not do anything", and her symptoms are the same.  She inquires about other options.  She denies any pain.  She is otherwise comfortable.  Symptoms stable and ongoing for more than 10 years at this point.    Prior workup: MRI brain 05/21/19 - moderate chronic small vessel ischemic changes,  Screened for adrenal insufficiency, AM cortisol 10.5.   GES - normal Ph impedance study 09/16/2015- Demeester score of 21.8, done off PPI  Esophageal manometry 09/07/2015 - normal swallows 7/10, 3/10 failed peristalsis, normal LES resting pressure with normal relaxation Smart pill study 09/08/2015 - normal gastric, small bowel, and colonic transit HIDA scan normal 2017 [place] normal 2017 Prior small bowel follow through, negative for SMA syndrome CT scan 02/2012 - air fluid level in  esophagus, ? Dysmotility?, otherwise normal  EGD Dec 2011 - normal CT neck May 2012 Colonsocopy 2013 - normal EGD 04/2013 - in ER for possible impaction?Patient had dilation EGD1/2019 -normal and no evidence of EoE.  Esophageal manometry testing3/2019 -very mild GEJ outflow obstruction. No dysmotility otherwise PH impedance testing 02/2018 -NORMAL - Demeester score of 0.4 on PPI with no correlation of her reflux events to symptoms. 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 We gave her a trial of gabapentin which did not help at all.   Overall summary of regimens try this far: PPIs have not controlled symptoms Tried baclofen 10mg  TID- did not help Tried buspar 5mg  BID- did not help Reportedly tried TCAs and reglan- did not help Reported history of SIBO- unclear response to Rifaximin Desipramine - no benefit Gabapentin - no benefit Trial of biofeed back / hypnosis for rumination - no benefit Trial of celexa - no benefit Trial of diaphragmatic breathing - no benefit  Most recent workup Ph / impedance testing / esophageal manometry 03/12/18 -Results mostly normal. Very mild GEJ outflow obstruction  pH impedance study is normal.Demeester 0.4, no correlation of symptoms to reflux episodes. EGD 01/01/2018 - normal esophagus, biopsies taken and no evidence of EoE, benign gastric polyps, normal stomach / duodenum   Past Medical History:  Diagnosis Date  . Acid reflux 11/07/2010  . GERD (gastroesophageal reflux disease)   . Hyperlipidemia   . Hypertension 11/07/2010  .  Rumination      Past Surgical History:  Procedure Laterality Date  . [place] STUDY N/A 03/03/2018   Procedure: 24 HOUR PH STUDY (on PPI);  Surgeon: Mauri Pole, MD;  Location: [facility];  Service: Endoscopy;  Laterality: N/A;  . BRAVO [place] STUDY  01/10/2011   [name]  . COLONOSCOPY     04/2013  . ESOPHAGEAL MANOMETRY N/A 03/03/2018   Procedure:  ESOPHAGEAL MANOMETRY (EM);  Surgeon: Mauri Pole, MD;  Location: [facility];  Service: Endoscopy;  Laterality: N/A;  . ESOPHAGOGASTRODUODENOSCOPY  10/2010  . [place] IMPEDANCE STUDY N/A 03/03/2018   Procedure: PH IMPEDANCE STUDY (on PPI);  Surgeon: Mauri Pole, MD;  Location: [facility];  Service: Endoscopy;  Laterality: N/A;  . TUBAL LIGATION     Family History  Problem Relation Age of Onset  . Stroke Mother   . Kidney disease Sister   . Stroke Sister   . Diabetes Sister   . Diabetes Brother   . Kidney failure Brother   . Other Maternal Grandmother        poor circulation  . Hypertension Maternal Grandmother   . Heart disease Maternal Grandmother   . Colon cancer Neg Hx   . Stomach cancer Neg Hx    Social History   Tobacco Use  . Smoking status: Never Smoker  . Smokeless tobacco: Never Used  Substance Use Topics  . Alcohol use: No    Alcohol/week: 0.0 standard drinks  . Drug use: No   Current Outpatient Medications  Medication Sig Dispense Refill  . amLODipine (NORVASC) 5 MG tablet Take 5 mg by mouth daily.    . diclofenac (VOLTAREN) 50 MG EC tablet 1 tablet as needed.    . famotidine (PEPCID) 20 MG tablet Take 20 mg by mouth as needed for heartburn or indigestion.    . Multiple Vitamin (MULTIVITAMIN) tablet 1 tablet. Patient states that she takes for 4 weeks then will stop so that her body can take a break.    . pantoprazole (PROTONIX) 40 MG tablet Take 1 tablet by mouth every morning. 90 tablet 1  . simvastatin (ZOCOR) 20 MG tablet Take 20 mg by mouth every evening.     . sucralfate (CARAFATE) 1 g tablet TAKE 1 TABLET BY MOUTH EVERY 6 HOURS AS NEEDED. SLOWLY DISSOLVE 1 TABLET IN 1 TABLESPOON OF WATER TO MAKE A SLURRY BEFORE TAKING 90 tablet 1   Current Facility-Administered Medications  Medication Dose Route Frequency Provider Last Rate Last Admin  . 0.9 %  sodium chloride infusion  500 mL Intravenous Once Denesia Donelan, Carlota Raspberry, MD       No Known  Allergies   Review of Systems: All systems reviewed and negative except where noted in HPI.    MM 3D SCREEN BREAST BILATERAL  Result Date: 11/23/2019 CLINICAL DATA:  Screening. EXAM: DIGITAL SCREENING BILATERAL MAMMOGRAM WITH TOMO AND CAD COMPARISON:  Previous exam(s). ACR Breast Density Category b: There are scattered areas of fibroglandular density. FINDINGS: There are no findings suspicious for malignancy. Images were processed with CAD. IMPRESSION: No mammographic evidence of malignancy. A result letter of this screening mammogram will be mailed directly to the patient. RECOMMENDATION: Screening mammogram in one year. (Code:SM-B-01Y) BI-RADS CATEGORY  1: Negative. Electronically Signed   By: Ammie Ferrier M.D.   On: 11/23/2019 14:50    Physical Exam: BP 128/68 (BP Location: Left Arm, Patient Position: Sitting, Cuff Size: Normal)   Pulse 68   Temp (!) 97.2  F (36.2 C)   Ht 5' 6.75" (1.695 m)   Wt 132 lb (59.9 kg)   BMI 20.83 kg/m  Constitutional: Pleasant,well-developed, female in no acute distress. HEENT: Normocephalic and atraumatic. Conjunctivae are normal. No scleral icterus. Neck supple.  Cardiovascular: Normal rate, regular rhythm.  Pulmonary/chest: Effort normal and breath sounds normal. No wheezing, rales or rhonchi. Abdominal: Soft, nondistended, nontender.There are no masses palpable. No hepatomegaly. Extremities: no edema Lymphadenopathy: No cervical adenopathy noted. Neurological: Alert and oriented to person place and time. Skin: Skin is warm and dry. No rashes noted. Psychiatric: Normal mood and affect. Behavior is normal.   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  At one point in time she did have a positive pH study off PPI, however PPIs have not controlled her symptoms and repeat pH study on PPI showed a normal DeMeester score with negative symptom index.  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  Is a very difficult situation, I have exhausted all medical therapies up to this point.  We discussed other potential options which could include TIF if there is a component of reflux to her symptoms.  I explained to her what this was, that there are risks and there would be a possible chance that this may not benefit her at all.  I told her I would discuss her case with Dr. Bryan Lemma to see if this was a worthwhile referral or not.  I did discuss her case with him after her visit and he agreed to see her and review her file and make a recommendation on whether or not this can be considered as an attempt to help some of the symptoms given they have been persistent for so long.  I otherwise offered her another referral to a tertiary care facility [facility]) which she has declined.  We will get her scheduled Dr. Bryan Lemma await his opinion understanding that this is a last ditch effort and attempt to help with these refractory symptoms that have failed all other inventions up to this point.  Harlan Cellar, MD [facility]

## 2019-11-30 ENCOUNTER — Telehealth: Payer: Self-pay

## 2019-11-30 NOTE — Telephone Encounter (Signed)
Called and spoke with patient-patient advised of need to schedule an appt with Dr. Bryan Lemma and patient is agreeable to plan of care and has been scheduled for an OV at the [facility] office on 12/21/2019 at 2:40 pm; patient requested to see Dr. Bryan Lemma at the [facility] office as she is comfortable traveling to this office; Patient advised to call back to the office at [phone] should questions/concerns arise;  Patient verbalized understanding of information/instructions;

## 2019-11-30 NOTE — Telephone Encounter (Signed)
-----   Message from Yetta Flock, MD sent at 11/27/2019  5:41 PM EST ----- Regarding: cirigliano consult Lesly Rubenstein can you please book this patient for a consult with Dr. Bryan Lemma for consideration for TIF. I have already discussed this case with him. Thanks

## 2019-12-21 ENCOUNTER — Ambulatory Visit (INDEPENDENT_AMBULATORY_CARE_PROVIDER_SITE_OTHER): Payer: Medicare HMO | Admitting: Gastroenterology

## 2019-12-21 ENCOUNTER — Encounter: Payer: Self-pay | Admitting: Gastroenterology

## 2019-12-21 VITALS — BP 120/72 | HR 80 | Temp 98.7°F | Ht 66.0 in | Wt 136.0 lb

## 2019-12-21 DIAGNOSIS — R110 Nausea: Secondary | ICD-10-CM | POA: Diagnosis not present

## 2019-12-21 DIAGNOSIS — R1110 Vomiting, unspecified: Secondary | ICD-10-CM | POA: Diagnosis not present

## 2019-12-21 MED ORDER — METOCLOPRAMIDE HCL 5 MG PO TABS: 5.0000 mg | ORAL_TABLET | Freq: Three times a day (TID) | ORAL | 0 refills | Status: AC

## 2019-12-21 NOTE — Patient Instructions (Signed)
We have sent the following medications to your pharmacy for you to pick up at your convenience:  Reglan

## 2019-12-21 NOTE — Progress Notes (Signed)
P  Chief Complaint:    Regurgitation  GI History: Dawn Stafford is a 64 year old female referred to me by Dr. Havery Moros for consultation and evaluation of possible antireflux intervention with Transoral Incisionless Fundoplication (TIF) with a goal to stop or significantly reduce acid suppression therapy.  GERD history: -Index symptoms: Regurgitation, chronic nausea.  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  She has been extensively evaluated by multiple Gastroenterologists in the past 10 years or so, to include [facility] motility center, [facility], and 5 separate GIs locally.  Has been trialed on multiple treatment regimens as detailed below, without much improvement.  Has had hypnosis therapy (no improvement), referred for Diaphragmatic Breathing exercises (PT notes improvement, but patient says it did not work).  No odynophagia or chest pain.  Her most bothersome symptom is regurgitation.  Extensive evaluation to date: MRI brain 05/21/19 - moderate chronic small vessel ischemic changes,  Screened for adrenal insufficiency, AM cortisol 10.5.  GES - normal Ph impedance study 09/16/2015- Demeester score of 21.8, done off PPI  Esophageal manometry 09/07/2015 - normal swallows 7/10, 3/10 failed peristalsis, normal LES resting pressure with normal relaxation Smartpill study 09/08/2015 - normal gastric, small bowel, and colonic transit HIDA scan normal 2017 [place] normal 2017 Prior small bowel follow through, negative for SMA syndrome CT scan 02/2012 - air fluid level in esophagus, ? Dysmotility?, otherwise normal  EGD Dec 2011 - normal CT neck May 2012 Colonsocopy 2013 - normal EGD 04/2013 - in ER for possible impaction?Patient had dilation EGD1/2019  -normal esophagus, biopsies taken and no evidence of EoE, benign gastric polyps, normal stomach / duodenum  Esophageal manometry testing3/2019 -very mild GEJ outflow obstruction.No dysmotility otherwise PH impedance testing 02/2018 -NORMAL - Demeester score of 0.4 on PPI with no correlation of her reflux events to symptoms. 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 We gave her a trial of gabapentin which did not help at all.   Overall summary of regimens try this far: PPIs have not controlled symptoms Tried baclofen 10mg  TID- did not help Tried buspar 5mg  BID- did not help Reportedly tried TCAs and reglan- did not help Reported history of SIBO- unclear response to Rifaximin Desipramine - no benefit Gabapentin - no benefit Trial of biofeedback / hypnosis for rumination - no benefit Trial of celexa - no benefit Trial of diaphragmatic breathing - no benefit   Today, she states her most bothersome continues to be effortless regurgitation, particularly within 3-5 mins of eating, independent of food types. Can regurgitate independent of eating too. No dysphagia.  Walks around with a small trash can and Kleenex, constantly spitting or reswallowing regurgitated material throughout the day.  Some improvement in reflux symptoms (mostly heartburn) with acid suppression therapy, but continues to have frequent regurgitation.  Taking Protonix 40 mg/day as prescribed (no appreciable benefit with prior BID dosing), and taking Carafate TID now. Pepcid prn, typically taken prior to foods that are more bothersome (spicy, etc).   She reports that sxs started after oral surgery (cyst removal) in 2009.    Review of systems:     No chest pain, no SOB, no fevers, no urinary sx   Past Medical History:  Diagnosis Date  . Acid reflux 11/07/2010  . GERD (gastroesophageal reflux disease)   . Hyperlipidemia   .  Hypertension 11/07/2010  . Rumination     Patient's  surgical history, family medical history, social history, medications and allergies were all reviewed in Epic    Current Outpatient Medications  Medication Sig Dispense Refill  . amLODipine (NORVASC) 5 MG tablet Take 5 mg by mouth daily.    . diclofenac (VOLTAREN) 50 MG EC tablet 1 tablet as needed.    . famotidine (PEPCID) 20 MG tablet Take 20 mg by mouth as needed for heartburn or indigestion.    . Multiple Vitamin (MULTIVITAMIN) tablet 1 tablet. Patient states that she takes for 4 weeks then will stop so that her body can take a break.    . pantoprazole (PROTONIX) 40 MG tablet Take 1 tablet by mouth every morning. 90 tablet 1  . simvastatin (ZOCOR) 20 MG tablet Take 20 mg by mouth every evening.     . sucralfate (CARAFATE) 1 g tablet TAKE 1 TABLET BY MOUTH EVERY 6 HOURS AS NEEDED. SLOWLY DISSOLVE 1 TABLET IN 1 TABLESPOON OF WATER TO MAKE A SLURRY BEFORE TAKING 90 tablet 1   No current facility-administered medications for this visit.    Physical Exam:     Temp 98.7 F (37.1 C)   Ht 5\' 6"  (1.676 m)   Wt 136 lb (61.7 kg)   BMI 21.95 kg/m   GENERAL:  Pleasant female in NAD PSYCH: : Cooperative, normal affect EENT:  conjunctiva pink, mucous membranes moist, neck supple without masses ABDOMEN:  Nondistended, soft, nontender. No obvious masses, no hepatomegaly,   SKIN:  turgor, no lesions seen Musculoskeletal:  Normal muscle tone, normal strength NEURO: Alert and oriented x 3, no focal neurologic deficits   IMPRESSION and PLAN:    1) Rumination Syndrome 2) Regurgitation 3) Nausea  Very nice 64 year old female with a longstanding history of reflux, as previously demonstrated on pH study in 2016 (DeMeester score 21.8; off PPI).  Interestingly with normal pH study in 2019 (DeMeester score 0.4, minimal reflux events, poor symptom correlation; on PPI) which would seem to correlate as well controlled reflux on PPI therapy.  However, her overriding symptom seems to be more related to  Rumination Syndrome with her effortless regurgitation, typically within minutes of eating, spitting or reswallowing regurgitated material throughout the day.  Clinically seems like her reflux symptoms are actually well controlled, but the rumination syndromes are her prime driver now.  Unclear what the inciting event is, but that is not uncommon in Rumination Syndrome patients.  Nonetheless, has not responded to appropriate trials of Diaphragmatic Breathing exercises, baclofen.  Otherwise no significant motility disorder on EM, no GOO on multiple EGDs.  Normal GES.  Normal brain imaging.  -Discussed treatment strategies for Rumination Syndrome at length today, to include retrial of Diaphragmatic Breathing Exercises (declined), retrial of baclofen (declined).  Less described literature with retrialing short course of Reglan, which she would like to proceed -Reglan 5 mg TID x6 weeks.  Discussed ADR profile of Reglan with prolonged use at length today -Do not think proceeding with TIF would be appropriate given her Rumination Syndrome, as this could potentially worsen his symptomatology (introducing new high pressure zone in GEJ when she is physiologically creating increased intragastric pressures).  If we are able to successfully treat her Rumination Syndrome, could be worth entertaining TIF in the future as a means to stop or significantly reduce need for acid suppression therapy for typical reflux symptoms -Coordinated recommendations with Dr. Havery Moros -Can follow-up with me as needed, otherwise will continue to follow with Dr.  Armbruster in 6 months or sooner as needed   I spent 30-39 minutes of time, including in depth chart review, independent review of results as outlined above, communicating results with the patient directly, face-to-face time with the patient, coordinating care, and ordering studies and medications as appropriate. Greater than 50% of the time was spent counseling and coordinating  care.             Lavena Bullion ,DO, FACG 12/21/2019, 2:40 PM

## 2020-01-08 ENCOUNTER — Telehealth: Payer: Self-pay | Admitting: Gastroenterology

## 2020-01-08 NOTE — Telephone Encounter (Signed)
Spoke to patient who states  that since starting Reglan a few weeks ago she feels the medication is not working for her. She states that she spoke to the pharmacist when she picked it up at the pharmacy and he explained to her that it will help with her nerves,ans anxiety. I Explained to her the classification and action of the drug that it is used for. She voiced understanding and has decided to continue to course for 6 weeks as prescribed. She will call the office if she has anymore questions.

## 2020-01-18 DIAGNOSIS — H5200 Hypermetropia, unspecified eye: Secondary | ICD-10-CM | POA: Diagnosis not present

## 2020-01-18 DIAGNOSIS — Z0100 Encounter for examination of eyes and vision without abnormal findings: Secondary | ICD-10-CM | POA: Diagnosis not present

## 2020-01-19 ENCOUNTER — Other Ambulatory Visit: Payer: Self-pay

## 2020-01-19 MED ORDER — SUCRALFATE 1 G PO TABS: ORAL_TABLET | ORAL | 1 refills | Status: DC

## 2020-01-19 NOTE — Progress Notes (Signed)
Rec'd fax request from [facility] in [place] for Sucralfate tablets.  Recently seen by Dr. Bryan Lemma. Refilled script

## 2020-02-02 ENCOUNTER — Encounter: Payer: Self-pay | Admitting: Gastroenterology

## 2020-02-02 ENCOUNTER — Other Ambulatory Visit: Payer: Self-pay

## 2020-02-02 ENCOUNTER — Ambulatory Visit (INDEPENDENT_AMBULATORY_CARE_PROVIDER_SITE_OTHER): Payer: Medicare HMO | Admitting: Gastroenterology

## 2020-02-02 VITALS — BP 130/70 | HR 72 | Temp 97.7°F | Ht 66.0 in | Wt 139.0 lb

## 2020-02-02 DIAGNOSIS — K219 Gastro-esophageal reflux disease without esophagitis: Secondary | ICD-10-CM | POA: Diagnosis not present

## 2020-02-02 DIAGNOSIS — R1110 Vomiting, unspecified: Secondary | ICD-10-CM

## 2020-02-02 NOTE — Patient Instructions (Addendum)
If you are age 66 or older, your body mass index should be between 23-30. Your Body mass index is 22.44 kg/m. If this is out of the aforementioned range listed, please consider follow up with your Primary Care Provider.  If you are age 48 or younger, your body mass index should be between 19-25. Your Body mass index is 22.44 kg/m. If this is out of the aformentioned range listed, please consider follow up with your Primary Care Provider.   We will refer you to [facility], Dr. Kris Mouton.  They will contact you to schedule an appointment.  If you have not heard from them within 2 weeks, please let us know and we can follow up for you.   Thank you for entrusting me with your care and for choosing [facility], Dr. Natalbany Cellar

## 2020-02-02 NOTE — Progress Notes (Signed)
HPI :  64 year old female here for follow-up visit for rumination/regurgitation, reflux.  She has seen me a few times for this issue, she has had a very extensive evaluation as outlined below. She is seen at least 5 or 6 gastroenterologist in the past 10 years for symptoms.    Her main symptom is regurgitation.  This can occur most frequently following eating, can come back instantly or after few minutes and occasionally spits out contents.  She denies any dysphagia at baseline.  She has chronic nausea intermittently.  She is currently taking Protonix 40 mg once a day and Carafate as needed as well as Pepcid as needed.  This regimen has not prevented any of the symptoms but states her reflux seems controlled on it. No weight loss.  Please see below for exhaustive work-up and trial of numerous regimens for her in the past, none of which she states have helped at all.    Recently she was given a trial of Reglan which did not help at all.  Most recently I had referred her for diaphragmatic breathing for rumination, per the therapist they had thought she had some benefit from this however the patient states it did not help at all.  She has had hypnosis therapy in the past for possible rumination which did not help.  She is otherwise comfortable.  Symptoms stable and ongoing for more than 10 years at this point.    She did previously have an abnormal pH study in September 2016 off PPI showing a DeMeester score of 21.8.  I had repeated the pH study on PPI in March 2019 which showed DeMeester score of 0.3 and good control of symptoms without any correlation to her regurgitation.  I referred her to my partner Dr. Bryan Lemma to get his thoughts about possible TIF in relation to her symptoms.  She was not thought to be a good candidate if the symptoms truly represent rumination and not reflux.  She is quite frustrated by her ongoing symptoms.  As below she has been tried on numerous PPIs, baclofen, antiemetics,  TCAs, SSRIs, buspirone, gabapentin, diaphragmatic breathing, hypnosis.  None of these regimens have provided any benefit to her at all.  Prior workup: MRI brain 05/21/19 - moderate chronic small vessel ischemic changes,  Screened for adrenal insufficiency, AM cortisol 10.5.  GES - normal Ph impedance study 09/16/2015- Demeester score of 21.8, done off PPI  Esophageal manometry 09/07/2015 - normal swallows 7/10, 3/10 failed peristalsis, normal LES resting pressure with normal relaxation Smartpill study 09/08/2015 - normal gastric, small bowel, and colonic transit HIDA scan normal 2017 [place] normal 2017 Prior small bowel follow through, negative for SMA syndrome CT scan 02/2012 - air fluid level in esophagus, ? Dysmotility?, otherwise normal  EGD Dec 2011 - normal CT neck May 2012 Colonsocopy 2013 - normal EGD 04/2013 - in ER for possible impaction?Patient had dilation EGD1/2019 -normal and no evidence of EoE.  Esophageal manometry testing3/2019 -very mild GEJ outflow obstruction.No dysmotility otherwise PH impedance testing 02/2018 -NORMAL - Demeester score of 0.4 on PPI with no correlation of her reflux events to symptoms. 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 We gave her a trial of gabapentin which did not help at all.   Overall summary of regimens try this far: PPIs have not controlled symptoms Baclofen 10mg  TID- did not help Buspar 5mg  BID- did not help TCAs / desipramine - no benefit Reglan- did not help Reported history of SIBO-  unclear response to Rifaximin Gabapentin - no benefit Trial of biofeed back / hypnosis for rumination - no benefit Trial of celexa - no benefit Trial of diaphragmatic breathing - no benefit  Most recent workup Ph / impedance testing / esophageal manometry 03/12/18 -Results mostly normal. Very mild GEJ outflow obstruction  pH impedance study is normal.Demeester 0.4, no correlation of symptoms to reflux  episodes. EGD 01/01/2018 - normal esophagus, biopsies taken and no evidence of EoE, benign gastric polyps, normal stomach / duodenum    Past Medical History:  Diagnosis Date  . Acid reflux 11/07/2010  . GERD (gastroesophageal reflux disease)   . Hyperlipidemia   . Hypertension 11/07/2010  . Rumination      Past Surgical History:  Procedure Laterality Date  . [place] STUDY N/A 03/03/2018   Procedure: 24 HOUR PH STUDY (on PPI);  Surgeon: Mauri Pole, MD;  Location: [facility];  Service: Endoscopy;  Laterality: N/A;  . BRAVO [place] STUDY  01/10/2011   [name]  . COLONOSCOPY     04/2013  . ESOPHAGEAL MANOMETRY N/A 03/03/2018   Procedure: ESOPHAGEAL MANOMETRY (EM);  Surgeon: Mauri Pole, MD;  Location: [facility];  Service: Endoscopy;  Laterality: N/A;  . ESOPHAGOGASTRODUODENOSCOPY  10/2010  . [place] IMPEDANCE STUDY N/A 03/03/2018   Procedure: PH IMPEDANCE STUDY (on PPI);  Surgeon: Mauri Pole, MD;  Location: [facility];  Service: Endoscopy;  Laterality: N/A;  . TUBAL LIGATION     Family History  Problem Relation Age of Onset  . Stroke Mother   . Kidney disease Sister   . Stroke Sister   . Diabetes Sister   . Diabetes Brother   . Kidney failure Brother   . Other Maternal Grandmother        poor circulation  . Hypertension Maternal Grandmother   . Heart disease Maternal Grandmother   . Colon cancer Neg Hx   . Stomach cancer Neg Hx   . Pancreatic cancer Neg Hx   . Throat cancer Neg Hx    Social History   Tobacco Use  . Smoking status: Never Smoker  . Smokeless tobacco: Never Used  Substance Use Topics  . Alcohol use: No    Alcohol/week: 0.0 standard drinks  . Drug use: No   Current Outpatient Medications  Medication Sig Dispense Refill  . amLODipine (NORVASC) 5 MG tablet Take 5 mg by mouth daily.    . diclofenac (VOLTAREN) 50 MG EC tablet 1 tablet as needed.    . famotidine (PEPCID) 20 MG tablet Take 20 mg by mouth as needed for  heartburn or indigestion.    . metoCLOPramide (REGLAN) 5 MG tablet Take 1 tablet (5 mg total) by mouth 3 (three) times daily. 140 tablet 0  . Multiple Vitamin (MULTIVITAMIN) tablet 1 tablet. Patient states that she takes for 4 weeks then will stop so that her body can take a break.    . pantoprazole (PROTONIX) 40 MG tablet Take 1 tablet by mouth every morning. 90 tablet 1  . simvastatin (ZOCOR) 20 MG tablet Take 20 mg by mouth every evening.     . sucralfate (CARAFATE) 1 g tablet TAKE 1 TABLET BY MOUTH EVERY 6 HOURS AS NEEDED. SLOWLY DISSOLVE 1 TABLET IN 1 TABLESPOON OF WATER TO MAKE A SLURRY BEFORE TAKING 90 tablet 1   No current facility-administered medications for this visit.   No Known Allergies   Review of Systems: All systems reviewed and negative except where noted in HPI.  Physical Exam: BP 130/70 (BP Location: Left Arm, Patient Position: Sitting, Cuff Size: Normal)   Pulse 72   Temp 97.7 F (36.5 C)   Ht 5\' 6"  (1.676 m)   Wt 139 lb (63 kg)   BMI 22.44 kg/m  Constitutional: Pleasant,well-developed, female in no acute distress. Neurological: Alert and oriented to person place and time. Psychiatric: Normal mood and affect. Behavior is normal.   ASSESSMENT AND PLAN: 65 year old female here for reassessment of the following  Rumination syndrome / Regurgitation / GERD: As above, exhaustive work-up for longstanding symptoms of more than 10 years for effortless regurgitation and suspicion of rumination.  She is been previously evaluated by multiple GI providers as above including [facility] and [facility].  While she had a prior pH study which showed evidence of pathologic reflux off PPI previously, her study was repeated on PPI and was entirely normal with poor correlation to her symptoms.  I do think that rumination is the most likely diagnosis given her current symptoms and how she reports them today however she has failed several modalities of therapy for this including  diaphragmatic breathing most recently.  She is quite frustrated about ongoing symptoms and I discussed this with her at length.  I have exhausted her medical therapies for this issue to date.  I even had her see my colleague for consideration for TIF as a nonsurgical way to treat this with a different mechanism of action but he did not think she was a good candidate.  At this point in time I recommend referral to tertiary care facility at an [facility] for this issue due to persistent symptoms.  I will refer her to Dr. Kris Mouton of [facility] to see if he has any additional thoughts on treatment for this patient. She agreed with the plan, all questions answered, she will continue the present regimen in the interim.   I spent 30 minutes of time, including in depth chart review, independent review of results as outlined above, communicating results with the patient directly, face-to-face time with the patient, coordinating care, and ordering studies and medications as appropriate, and documenting this encounter.  Coin Cellar, MD [facility]

## 2020-02-03 ENCOUNTER — Telehealth: Payer: Self-pay

## 2020-02-03 NOTE — Telephone Encounter (Signed)
Referral to Dr. Kris Mouton at [facility] faxed

## 2020-02-15 NOTE — Telephone Encounter (Signed)
Called [facility] at [phone] regarding referral to Dr. Kris Mouton. They confirmed she has an appt with Dr Mariea Clonts on 3-10.  Patient is aware of the appt.

## 2020-02-16 DIAGNOSIS — Z79899 Other long term (current) drug therapy: Secondary | ICD-10-CM | POA: Diagnosis not present

## 2020-02-16 DIAGNOSIS — K219 Gastro-esophageal reflux disease without esophagitis: Secondary | ICD-10-CM | POA: Diagnosis not present

## 2020-02-16 DIAGNOSIS — R110 Nausea: Secondary | ICD-10-CM | POA: Diagnosis not present

## 2020-02-23 DIAGNOSIS — E785 Hyperlipidemia, unspecified: Secondary | ICD-10-CM | POA: Diagnosis not present

## 2020-02-23 DIAGNOSIS — K2100 Gastro-esophageal reflux disease with esophagitis, without bleeding: Secondary | ICD-10-CM | POA: Diagnosis not present

## 2020-02-23 DIAGNOSIS — I10 Essential (primary) hypertension: Secondary | ICD-10-CM | POA: Diagnosis not present

## 2020-02-24 DIAGNOSIS — I10 Essential (primary) hypertension: Secondary | ICD-10-CM | POA: Diagnosis not present

## 2020-02-24 DIAGNOSIS — R1110 Vomiting, unspecified: Secondary | ICD-10-CM | POA: Diagnosis not present

## 2020-02-24 DIAGNOSIS — K219 Gastro-esophageal reflux disease without esophagitis: Secondary | ICD-10-CM | POA: Diagnosis not present

## 2020-03-21 ENCOUNTER — Other Ambulatory Visit: Payer: Self-pay | Admitting: Gastroenterology

## 2020-04-19 ENCOUNTER — Other Ambulatory Visit: Payer: Self-pay | Admitting: Gastroenterology

## 2020-04-20 DIAGNOSIS — R1110 Vomiting, unspecified: Secondary | ICD-10-CM | POA: Diagnosis not present

## 2020-05-02 DIAGNOSIS — Z23 Encounter for immunization: Secondary | ICD-10-CM | POA: Diagnosis not present

## 2020-05-20 ENCOUNTER — Other Ambulatory Visit: Payer: Self-pay | Admitting: Gastroenterology

## 2020-05-23 DIAGNOSIS — R112 Nausea with vomiting, unspecified: Secondary | ICD-10-CM | POA: Diagnosis not present

## 2020-05-23 DIAGNOSIS — I10 Essential (primary) hypertension: Secondary | ICD-10-CM | POA: Diagnosis not present

## 2020-06-02 DIAGNOSIS — R69 Illness, unspecified: Secondary | ICD-10-CM | POA: Diagnosis not present

## 2020-06-14 ENCOUNTER — Other Ambulatory Visit: Payer: Self-pay | Admitting: Gastroenterology

## 2020-06-27 IMAGING — DX LUMBAR SPINE - COMPLETE 4+ VIEW
5 series · 5 of 5 positions shown · non-contrast
Comparison: CT, 02/25/2012

CLINICAL DATA: Fall at work 4 days ago.  Left hip pain.

EXAM:
LUMBAR SPINE - COMPLETE 4+ VIEW

[l-spine ap]
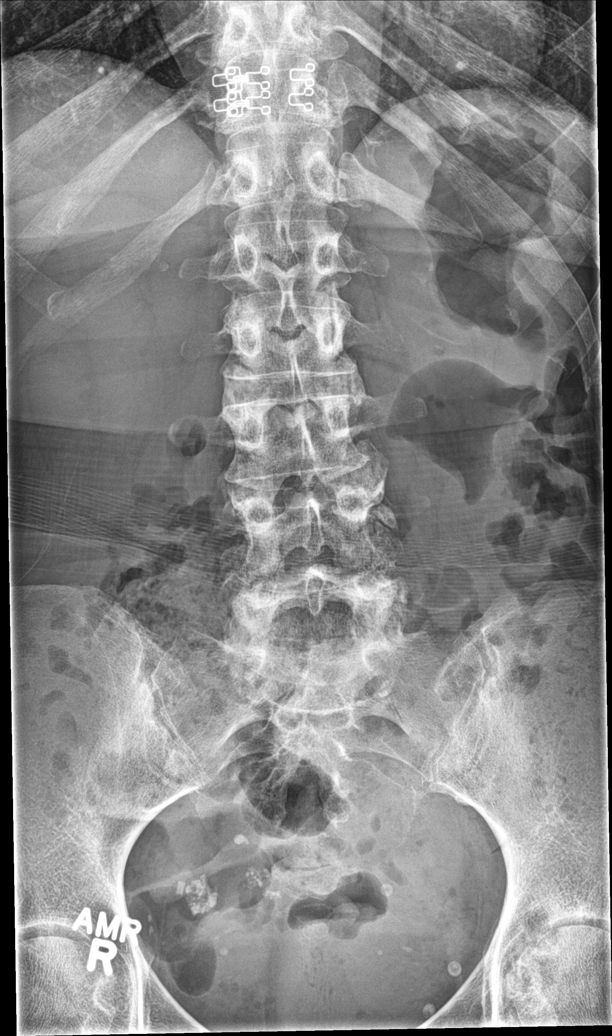

[l-spine obl (1 of 2)]
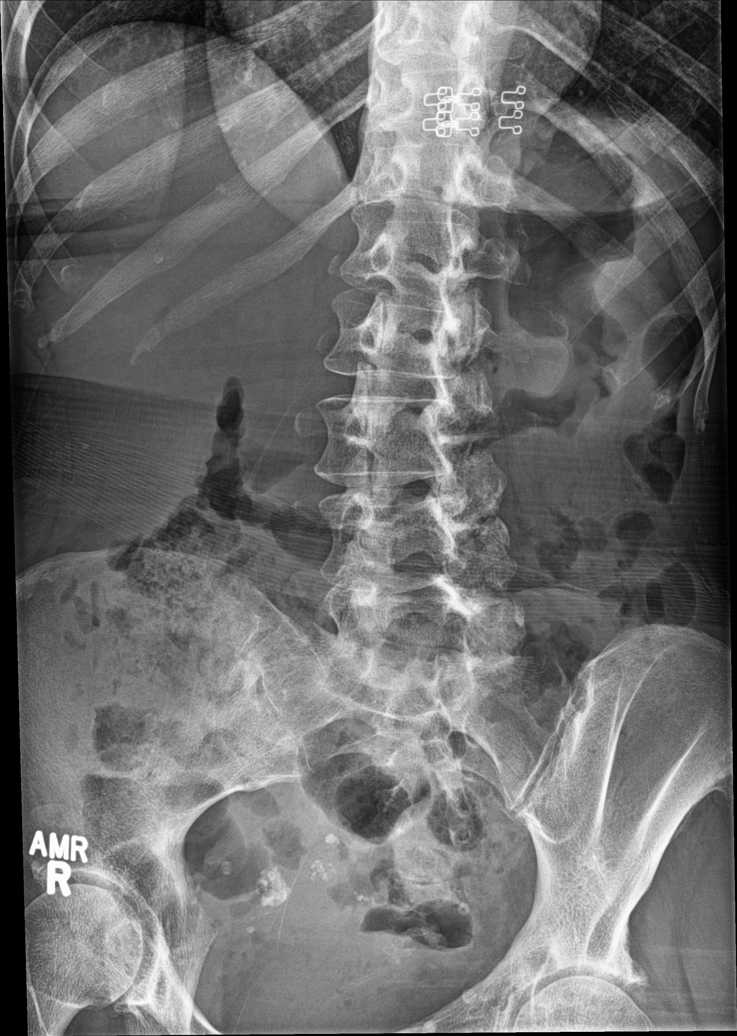

[l-spine obl (2 of 2)]
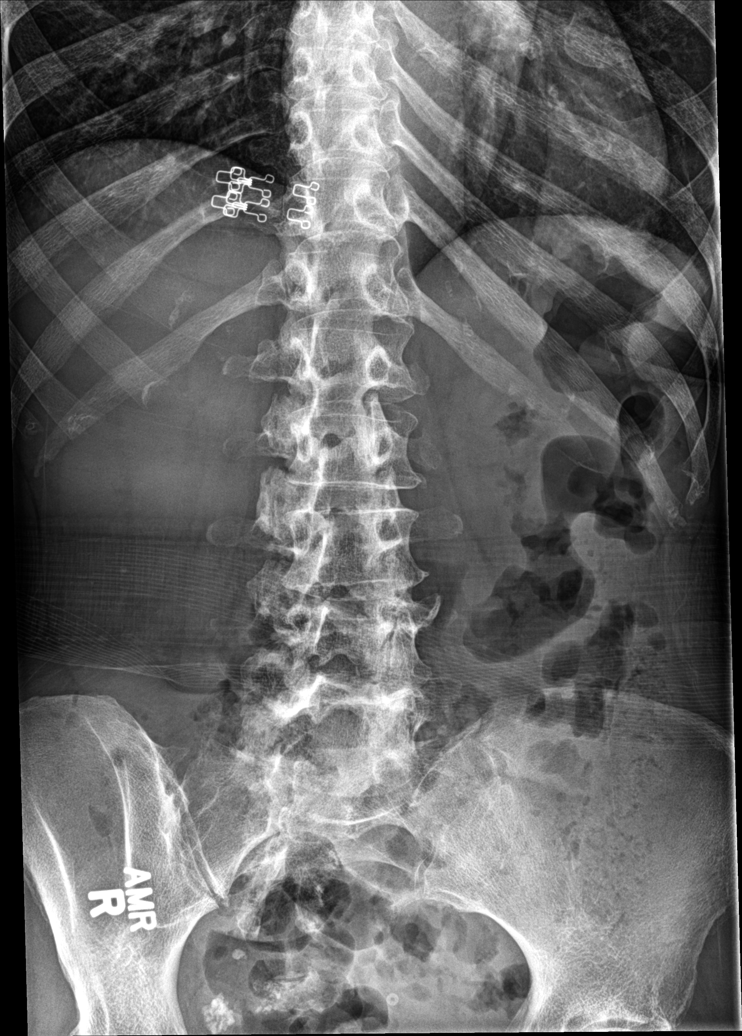

[l-spine lat]
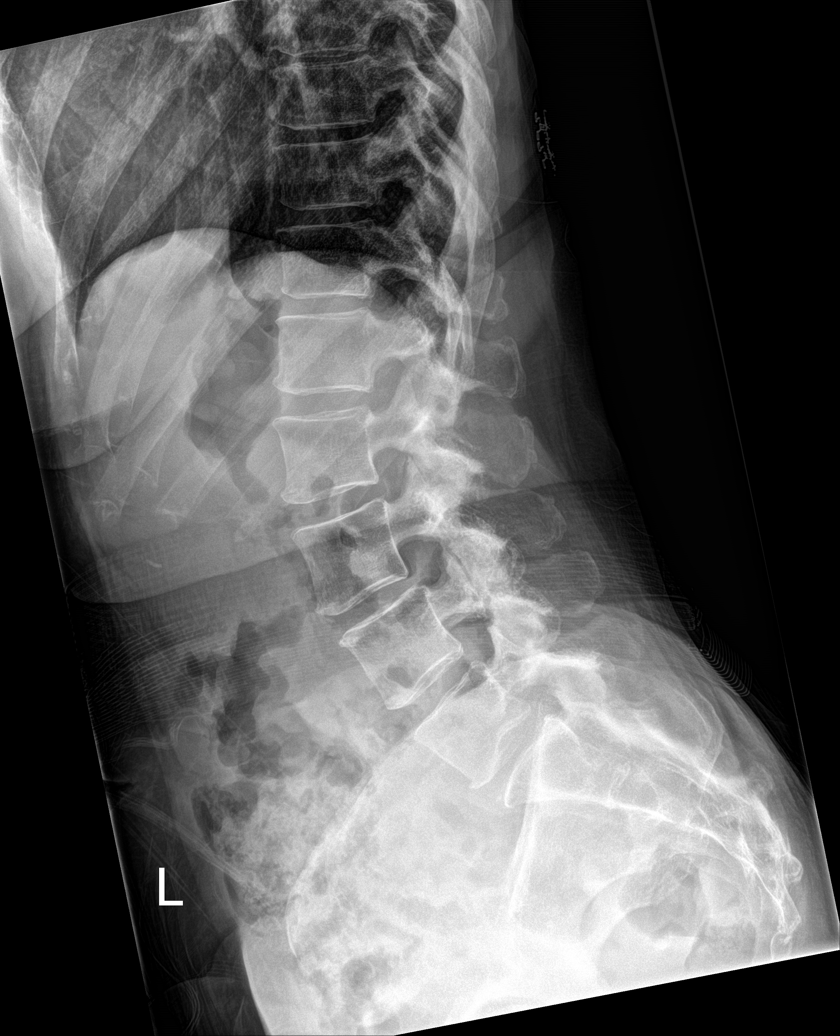

[l-spine spot]
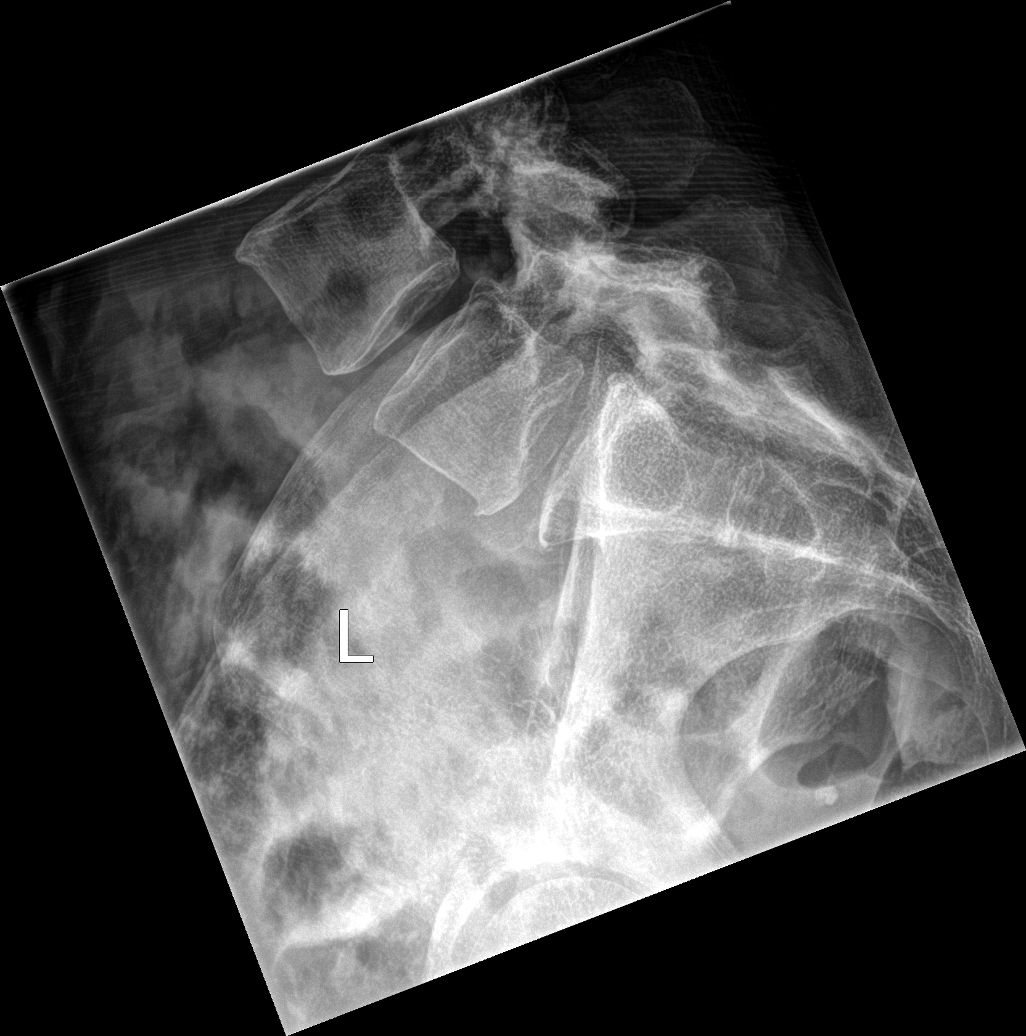

[5 of 5 positions shown; findings below may reference images not displayed]

FINDINGS: No fracture or spondylolisthesis.  No bone lesion.

Mild loss of disc height at L3-L4 and L4-L5 and L5-S1. Remaining
lumbar discs are well preserved in height.

Soft tissues are unremarkable.
IMPRESSION: 1. No fracture or acute finding.
2. Mild disc degenerative changes from L3-L4 through L5-S1.

## 2020-06-27 IMAGING — DX DG HIP (WITH OR WITHOUT PELVIS) 2-3V LEFT
3 series · 3 of 3 positions shown · non-contrast
Comparison: None.

CLINICAL DATA: Fell at work 4 days ago.  Left hip pain.

EXAM:
DG HIP (WITH OR WITHOUT PELVIS) 2-3V LEFT

[pelvis ap]
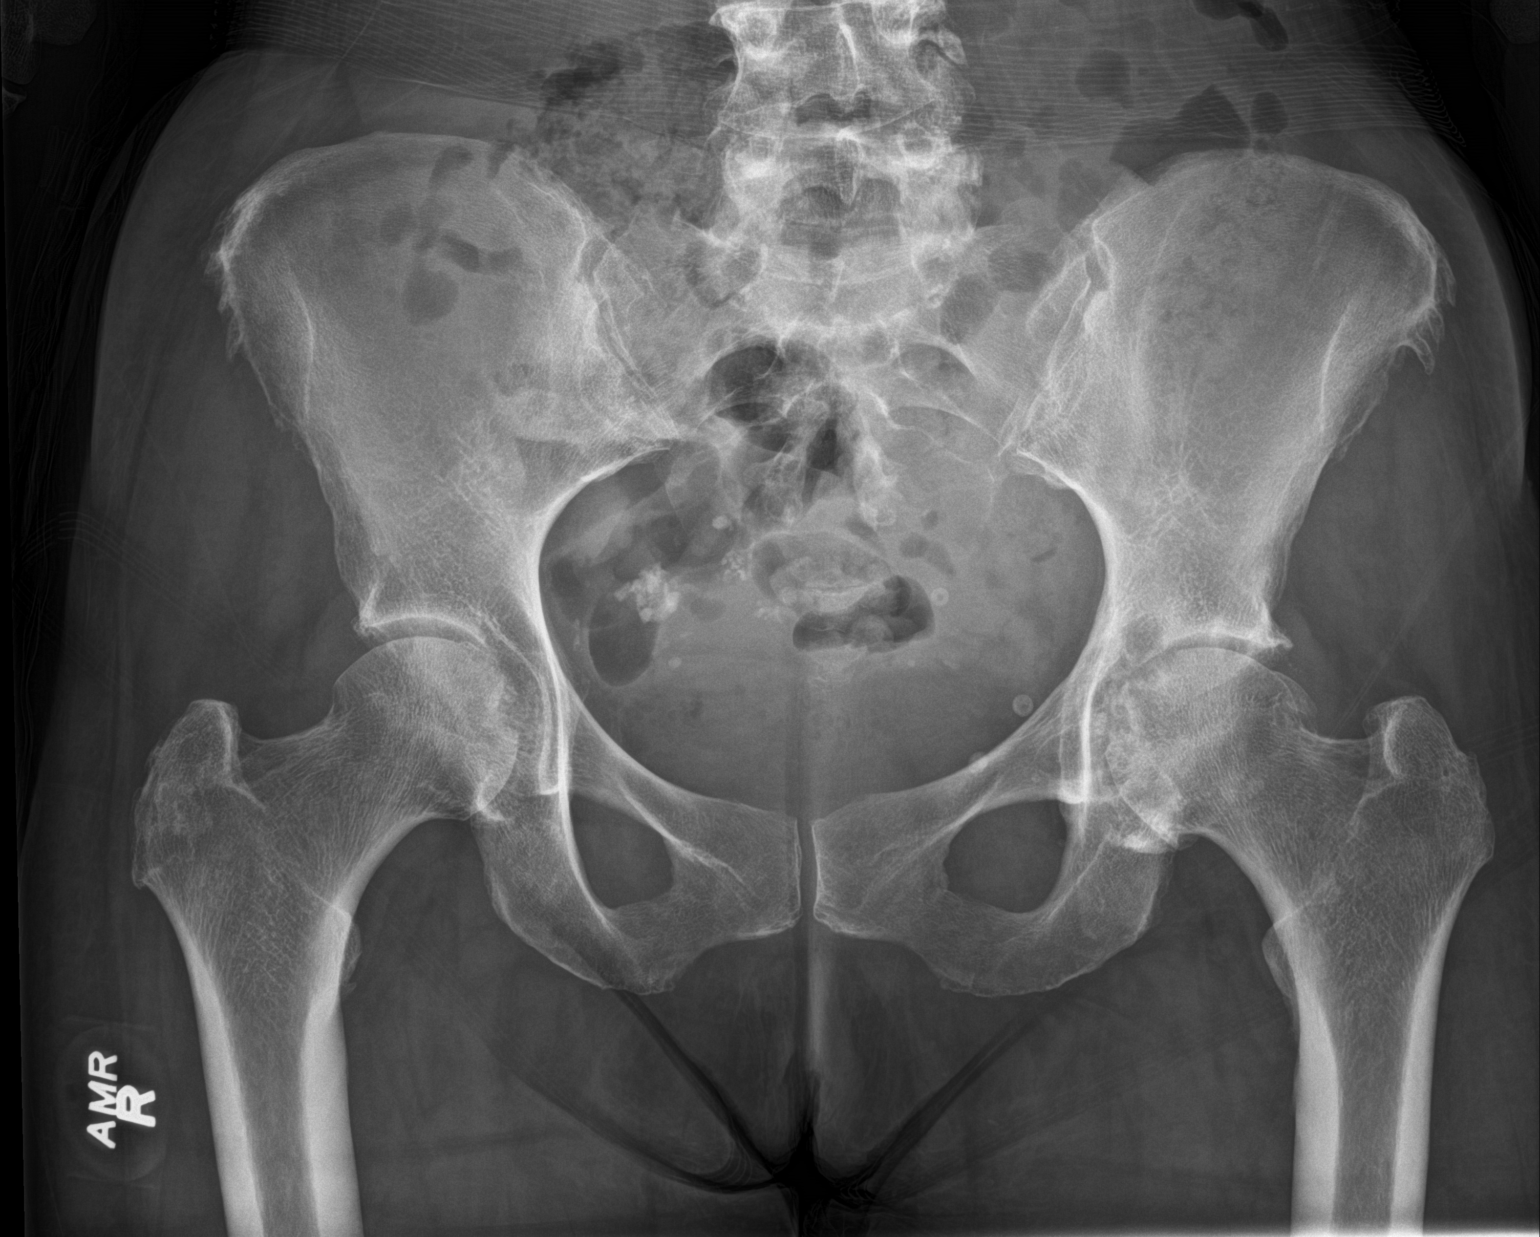

[hip ap]
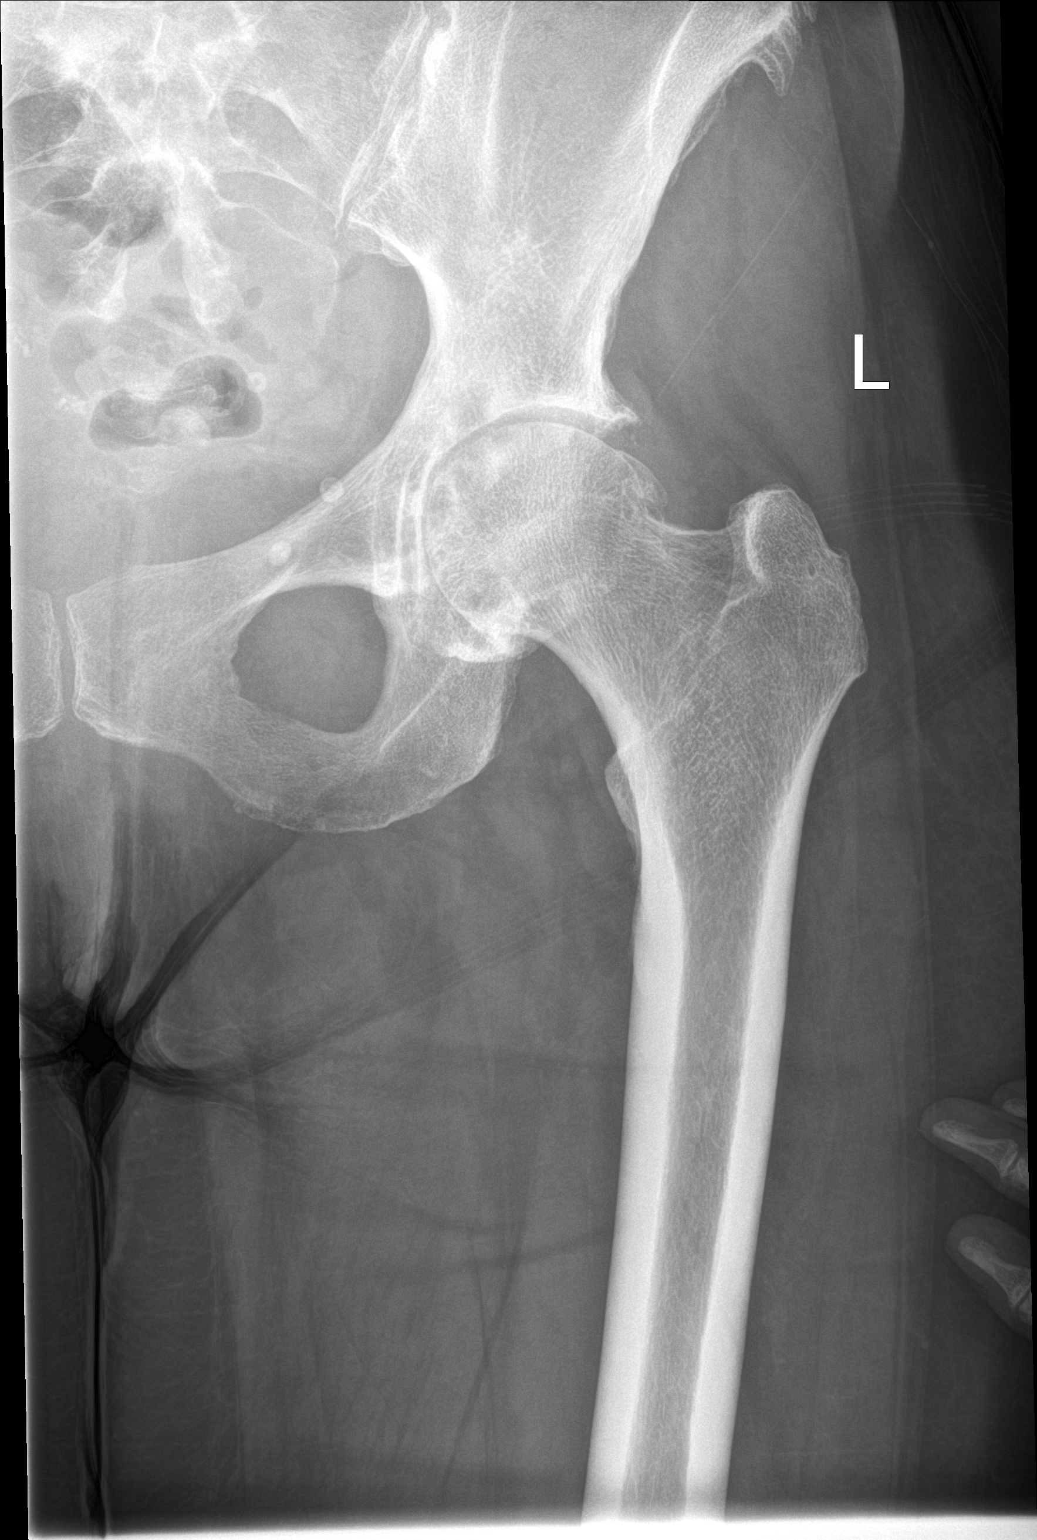

[hip lat]
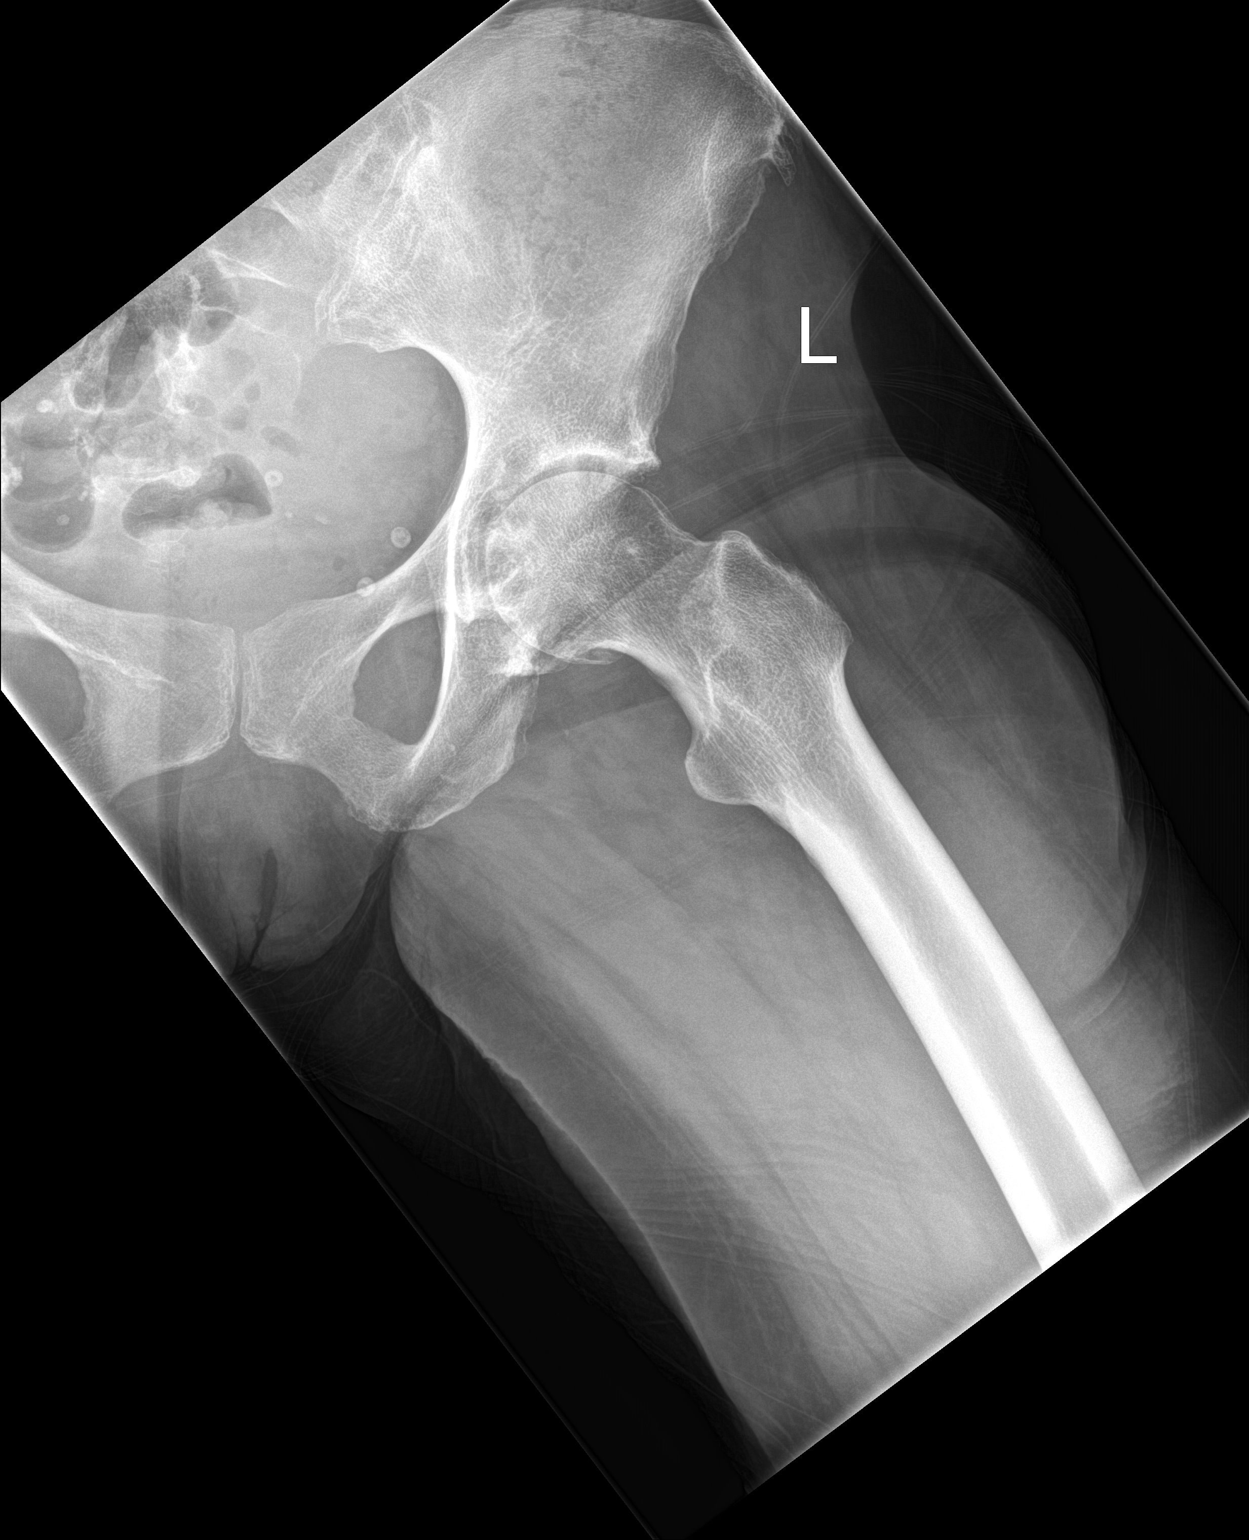

[3 of 3 positions shown; findings below may reference images not displayed]

FINDINGS: No fracture or bone lesion.

There is medial hip joint space narrowing bilaterally, greater on
the left. On the left there is associated subchondral cystic change
and sclerosis as well as osteophytes along the base of the left
femoral head.

SI joints and symphysis pubis are normally spaced and aligned.

Soft tissues are unremarkable.
IMPRESSION: 1. No fracture or acute finding.
2. Advanced arthropathic changes of the left hip.

## 2020-07-04 DIAGNOSIS — M79674 Pain in right toe(s): Secondary | ICD-10-CM | POA: Diagnosis not present

## 2020-07-04 DIAGNOSIS — M7740 Metatarsalgia, unspecified foot: Secondary | ICD-10-CM | POA: Diagnosis not present

## 2020-07-04 DIAGNOSIS — M79675 Pain in left toe(s): Secondary | ICD-10-CM | POA: Diagnosis not present

## 2020-07-04 DIAGNOSIS — L851 Acquired keratosis [keratoderma] palmaris et plantaris: Secondary | ICD-10-CM | POA: Diagnosis not present

## 2020-07-11 DIAGNOSIS — K219 Gastro-esophageal reflux disease without esophagitis: Secondary | ICD-10-CM | POA: Diagnosis not present

## 2020-07-11 DIAGNOSIS — I10 Essential (primary) hypertension: Secondary | ICD-10-CM | POA: Diagnosis not present

## 2020-07-17 ENCOUNTER — Other Ambulatory Visit: Payer: Self-pay | Admitting: Gastroenterology

## 2020-07-29 ENCOUNTER — Telehealth: Payer: Self-pay | Admitting: Adult Health

## 2020-07-29 NOTE — Telephone Encounter (Signed)
Per Dawn Stafford patient doesn't have to have paps anymore unless she wants to. Pt informed. States that she will call back if she needs to see [place].

## 2020-07-29 NOTE — Telephone Encounter (Signed)
Patient called wanting a call back because her PCP told her to see if she needs to continue to get a pap, due to her age

## 2020-08-18 ENCOUNTER — Other Ambulatory Visit: Payer: Self-pay | Admitting: Gastroenterology

## 2020-08-24 DIAGNOSIS — K222 Esophageal obstruction: Secondary | ICD-10-CM | POA: Diagnosis not present

## 2020-08-24 DIAGNOSIS — R1110 Vomiting, unspecified: Secondary | ICD-10-CM | POA: Diagnosis not present

## 2020-08-25 DIAGNOSIS — I10 Essential (primary) hypertension: Secondary | ICD-10-CM | POA: Diagnosis not present

## 2020-08-25 DIAGNOSIS — K219 Gastro-esophageal reflux disease without esophagitis: Secondary | ICD-10-CM | POA: Diagnosis not present

## 2020-09-07 DIAGNOSIS — I10 Essential (primary) hypertension: Secondary | ICD-10-CM | POA: Diagnosis not present

## 2020-09-07 DIAGNOSIS — K219 Gastro-esophageal reflux disease without esophagitis: Secondary | ICD-10-CM | POA: Diagnosis not present

## 2020-09-12 ENCOUNTER — Other Ambulatory Visit: Payer: Self-pay | Admitting: Gastroenterology

## 2020-09-18 ENCOUNTER — Other Ambulatory Visit: Payer: Self-pay | Admitting: Gastroenterology

## 2020-09-21 DIAGNOSIS — R1110 Vomiting, unspecified: Secondary | ICD-10-CM | POA: Diagnosis not present

## 2020-09-21 DIAGNOSIS — Z79899 Other long term (current) drug therapy: Secondary | ICD-10-CM | POA: Diagnosis not present

## 2020-09-21 DIAGNOSIS — K317 Polyp of stomach and duodenum: Secondary | ICD-10-CM | POA: Diagnosis not present

## 2020-09-21 DIAGNOSIS — I10 Essential (primary) hypertension: Secondary | ICD-10-CM | POA: Diagnosis not present

## 2020-09-21 DIAGNOSIS — K219 Gastro-esophageal reflux disease without esophagitis: Secondary | ICD-10-CM | POA: Diagnosis not present

## 2020-10-11 DIAGNOSIS — Z23 Encounter for immunization: Secondary | ICD-10-CM | POA: Diagnosis not present

## 2020-10-18 ENCOUNTER — Other Ambulatory Visit: Payer: Self-pay | Admitting: Gastroenterology

## 2020-10-21 ENCOUNTER — Telehealth: Payer: Self-pay | Admitting: Gastroenterology

## 2020-10-21 MED ORDER — SUCRALFATE 1 G PO TABS: ORAL_TABLET | ORAL | 0 refills | Status: DC

## 2020-10-21 NOTE — Telephone Encounter (Signed)
Dr Havery Moros- Patient last seen by you in 01/2020. You referred to tertiary care. She has been seen multiple times by Dr Mariea Clonts at [facility]. Do you want to continue refilling sucralfate or should she have Dr Mariea Clonts fill this if appropriate?

## 2020-10-21 NOTE — Telephone Encounter (Signed)
We can refill it, she can use it PRN if it helps. Thanks

## 2020-10-21 NOTE — Telephone Encounter (Signed)
rx sent

## 2020-10-26 DIAGNOSIS — I10 Essential (primary) hypertension: Secondary | ICD-10-CM | POA: Diagnosis not present

## 2020-10-26 DIAGNOSIS — K219 Gastro-esophageal reflux disease without esophagitis: Secondary | ICD-10-CM | POA: Diagnosis not present

## 2020-10-31 ENCOUNTER — Encounter: Payer: Self-pay | Admitting: Podiatry

## 2020-10-31 ENCOUNTER — Ambulatory Visit (INDEPENDENT_AMBULATORY_CARE_PROVIDER_SITE_OTHER): Payer: Medicare HMO | Admitting: Podiatry

## 2020-10-31 ENCOUNTER — Other Ambulatory Visit: Payer: Self-pay

## 2020-10-31 DIAGNOSIS — M79671 Pain in right foot: Secondary | ICD-10-CM

## 2020-10-31 DIAGNOSIS — L989 Disorder of the skin and subcutaneous tissue, unspecified: Secondary | ICD-10-CM | POA: Diagnosis not present

## 2020-10-31 NOTE — Patient Instructions (Signed)
Keep the bandage on for 24 hours. At that time, remove and clean with soap and water. If it hurts or burns before 24 hours go ahead and remove the bandage and wash with soap and water. Keep the area clean. If there is any blistering cover with antibiotic ointment and a bandage. Monitor for any redness, drainage, or other signs of infection. Call the office if any are to occur. If you have any questions, please call the office at [phone].  

## 2020-11-08 NOTE — Progress Notes (Signed)
Subjective:   Patient ID: Dawn Stafford, female   DOB: 64 y.o.   MRN: [id number]   HPI 64 year old female presents the office today for concerns of thick, painful callus, skin lesion to her right foot.  She said the area is painful with pressure.  Denies any redness or drainage or any swelling.  No recent treatment.  She has no other concerns today.  Review of Systems  All other systems reviewed and are negative.  Past Medical History:  Diagnosis Date  . Acid reflux 11/07/2010  . GERD (gastroesophageal reflux disease)   . Hyperlipidemia   . Hypertension 11/07/2010  . Rumination     Past Surgical History:  Procedure Laterality Date  . [place] STUDY N/A 03/03/2018   Procedure: 24 HOUR PH STUDY (on PPI);  Surgeon: Mauri Pole, MD;  Location: [facility];  Service: Endoscopy;  Laterality: N/A;  . BRAVO [place] STUDY  01/10/2011   [name]  . COLONOSCOPY     04/2013  . ESOPHAGEAL MANOMETRY N/A 03/03/2018   Procedure: ESOPHAGEAL MANOMETRY (EM);  Surgeon: Mauri Pole, MD;  Location: [facility];  Service: Endoscopy;  Laterality: N/A;  . ESOPHAGOGASTRODUODENOSCOPY  10/2010  . [place] IMPEDANCE STUDY N/A 03/03/2018   Procedure: PH IMPEDANCE STUDY (on PPI);  Surgeon: Mauri Pole, MD;  Location: [facility];  Service: Endoscopy;  Laterality: N/A;  . TUBAL LIGATION       Current Outpatient Medications:  .  amLODipine (NORVASC) 5 MG tablet, Take 5 mg by mouth daily., Disp: , Rfl:  .  desipramine (NORPRAMIN) 25 MG tablet, Take by mouth., Disp: , Rfl:  .  diclofenac (VOLTAREN) 50 MG EC tablet, 1 tablet as needed., Disp: , Rfl:  .  famotidine (PEPCID) 20 MG tablet, Take 20 mg by mouth as needed for heartburn or indigestion., Disp: , Rfl:  .  metoCLOPramide (REGLAN) 5 MG tablet, Take 1 tablet (5 mg total) by mouth 3 (three) times daily., Disp: 140 tablet, Rfl: 0 .  Multiple Vitamin (MULTIVITAMIN) tablet, 1 tablet. Patient states that she takes for 4 weeks then will stop  so that her body can take a break., Disp: , Rfl:  .  Multiple Vitamins-Minerals (ONE DAILY CALCIUM/IRON) TABS, 1 tablet., Disp: , Rfl:  .  pantoprazole (PROTONIX) 40 MG tablet, TAKE 1 TABLET BY MOUTH ONCE DAILY IN THE MORNING, Disp: 90 tablet, Rfl: 1 .  simvastatin (ZOCOR) 20 MG tablet, Take 20 mg by mouth every evening. , Disp: , Rfl:  .  sucralfate (CARAFATE) 1 g tablet, TAKE 1 TABLET BY MOUTH EVERY 6 HOURS AS NEEDED (SLOWLY  DISSOLVE  1  TABLET  IN 15ML  OF  WATER  TO  MAKE  A  SLURRY  BEFORE  TAKING), Disp: 90 tablet, Rfl: 0  No Known Allergies       Objective:  Physical Exam  General: AAO x3, NAD  Dermatological: Thick, annular, hyperkeratotic lesion submetatarsal 5 as well as the heel of the right foot.  There is no ongoing ulceration drainage or signs of infection no evidence of verruca.  Vascular: Dorsalis Pedis artery and Posterior Tibial artery pedal pulses are 2/4 bilateral with immedate capillary fill time.  There is no pain with calf compression, swelling, warmth, erythema.   Neruologic: Grossly intact via light touch bilateral.   Musculoskeletal: Tenderness to hyperkeratotic lesions but no other areas of discomfort identified today. Muscular strength 5/5 in all groups tested bilateral.  Gait: Unassisted, Nonantalgic.  Assessment:   Painful benign skin lesions     Plan:  -Treatment options discussed including all alternatives, risks, and complications -Etiology of symptoms were discussed -Sharply through the lesions were any complications or bleeding.  Skin was cleaned with alcohol and a pad was placed followed by salicylic acid in the bandage.  Post procedure instructions discussed.  Monitor for any signs or symptoms of infection.  Trula Slade DPM

## 2020-11-16 ENCOUNTER — Other Ambulatory Visit (HOSPITAL_COMMUNITY): Payer: Self-pay | Admitting: Internal Medicine

## 2020-11-16 DIAGNOSIS — Z1231 Encounter for screening mammogram for malignant neoplasm of breast: Secondary | ICD-10-CM

## 2020-11-18 ENCOUNTER — Other Ambulatory Visit: Payer: Self-pay | Admitting: Gastroenterology

## 2020-12-19 ENCOUNTER — Other Ambulatory Visit: Payer: Self-pay | Admitting: Gastroenterology

## 2020-12-31 ENCOUNTER — Ambulatory Visit
Admission: EM | Admit: 2020-12-31 | Discharge: 2020-12-31 | Disposition: A | Discharge status: Home / Self Care | Payer: Medicare HMO

## 2020-12-31 DIAGNOSIS — M79672 Pain in left foot: Secondary | ICD-10-CM | POA: Diagnosis not present

## 2020-12-31 NOTE — ED Provider Notes (Signed)
[place]   [id number] 12/31/20 Arrival Time: [facility]   Chief Complaint  Patient presents with  . Leg Swelling     SUBJECTIVE: History from: patient.  Dawn Stafford is a 65 y.o. female who presented to the urgent care for complaint of left foot swelling for the past few days.  Denies any precipitating event, trauma or injury.  Denies any foot pain.  Localized swelling to the left ankle area.  States she was taking diltiazem that was recently changed to hyocyamine.  Denies similar symptoms in the past.  Denies fever, chills,,, injury, foot pain.  ROS: As per HPI.  All other pertinent ROS negative.      Past Medical History:  Diagnosis Date  . Acid reflux 11/07/2010  . GERD (gastroesophageal reflux disease)   . Hyperlipidemia   . Hypertension 11/07/2010  . Rumination    Past Surgical History:  Procedure Laterality Date  . [place] STUDY N/A 03/03/2018   Procedure: 24 HOUR PH STUDY (on PPI);  Surgeon: Mauri Pole, MD;  Location: [facility];  Service: Endoscopy;  Laterality: N/A;  . BRAVO [place] STUDY  01/10/2011   [name]  . COLONOSCOPY     04/2013  . ESOPHAGEAL MANOMETRY N/A 03/03/2018   Procedure: ESOPHAGEAL MANOMETRY (EM);  Surgeon: Mauri Pole, MD;  Location: [facility];  Service: Endoscopy;  Laterality: N/A;  . ESOPHAGOGASTRODUODENOSCOPY  10/2010  . [place] IMPEDANCE STUDY N/A 03/03/2018   Procedure: PH IMPEDANCE STUDY (on PPI);  Surgeon: Mauri Pole, MD;  Location: [facility];  Service: Endoscopy;  Laterality: N/A;  . TUBAL LIGATION     No Known Allergies No current facility-administered medications on file prior to encounter.   Current Outpatient Medications on File Prior to Encounter  Medication Sig Dispense Refill  . hyoscyamine (LEVSIN) 0.125 MG/5ML ELIX Take 0.125 mg by mouth. Began 12/31/2020    . amLODipine (NORVASC) 5 MG tablet Take 5 mg by mouth daily.    Marland Kitchen desipramine (NORPRAMIN) 25 MG tablet Take by mouth.    .  diclofenac (VOLTAREN) 50 MG EC tablet 1 tablet as needed.    . famotidine (PEPCID) 20 MG tablet Take 20 mg by mouth as needed for heartburn or indigestion.    . metoCLOPramide (REGLAN) 5 MG tablet Take 1 tablet (5 mg total) by mouth 3 (three) times daily. 140 tablet 0  . Multiple Vitamin (MULTIVITAMIN) tablet 1 tablet. Patient states that she takes for 4 weeks then will stop so that her body can take a break.    . Multiple Vitamins-Minerals (ONE DAILY CALCIUM/IRON) TABS 1 tablet.    . pantoprazole (PROTONIX) 40 MG tablet TAKE 1 TABLET BY MOUTH ONCE DAILY IN THE MORNING 90 tablet 1  . simvastatin (ZOCOR) 20 MG tablet Take 20 mg by mouth every evening.     . sucralfate (CARAFATE) 1 g tablet TAKE 1 TABLET BY MOUTH EVERY 6 HOURS AS NEEDED (SLOWLY DISSOLVE IN 15 ML OF WATER TO MAKE A SLURRY BEFORE TAKING) 90 tablet 0   Social History   Socioeconomic History  . Marital status: Single    Spouse name: Not on file  . Number of children: Not on file  . Years of education: Not on file  . Highest education level: Not on file  Occupational History  . Not on file  Tobacco Use  . Smoking status: Never Smoker  . Smokeless tobacco: Never Used  Vaping Use  . Vaping Use: Never used  Substance and  Sexual Activity  . Alcohol use: No    Alcohol/week: 0.0 standard drinks  . Drug use: No  . Sexual activity: Yes    Partners: Male    Birth control/protection: Post-menopausal  Other Topics Concern  . Not on file  Social History Narrative  . Not on file   Social Determinants of Health   Financial Resource Strain: Not on file  Food Insecurity: Not on file  Transportation Needs: Not on file  Physical Activity: Not on file  Stress: Not on file  Social Connections: Not on file  Intimate Partner Violence: Not on file   Family History  Problem Relation Age of Onset  . Stroke Mother   . Kidney disease Sister   . Stroke Sister   . Diabetes Sister   . Diabetes Brother   . Kidney failure Brother   .  Other Maternal Grandmother        poor circulation  . Hypertension Maternal Grandmother   . Heart disease Maternal Grandmother   . Colon cancer Neg Hx   . Stomach cancer Neg Hx   . Pancreatic cancer Neg Hx   . Throat cancer Neg Hx     OBJECTIVE:  Vitals:   12/31/20 1548  BP: (!) 143/69  Pulse: 87  Resp: 18  Temp: 97.9 F (36.6 C)  SpO2: 99%     Physical Exam Vitals and nursing note reviewed.  Constitutional:      General: She is not in acute distress.    Appearance: Normal appearance. She is normal weight. She is not ill-appearing, toxic-appearing or diaphoretic.  HENT:     Head: Normocephalic.  Cardiovascular:     Rate and Rhythm: Normal rate and regular rhythm.     Pulses: Normal pulses.     Heart sounds: Normal heart sounds. No murmur heard. No friction rub. No gallop.      Comments: Nonpitting edema on left foot Pulmonary:     Effort: Pulmonary effort is normal. No respiratory distress.     Breath sounds: Normal breath sounds. No stridor. No wheezing, rhonchi or rales.  Chest:     Chest wall: No tenderness.  Neurological:     Mental Status: She is alert and oriented to person, place, and time.     LABS:  No results found for this or any previous visit (from the past 24 hour(s)).   ASSESSMENT & PLAN:  1. Left foot pain     No orders of the defined types were placed in this encounter.  Patient is stable at discharge.  Symptom is likely from side effect of diltiazem.  She was advised to follow PCP for further evaluation and medication management  Discharge instructions   Take medication as prescribed Follow-up with PCP Prop your legs up to help decrease swelling to gravity Return or go to ED if you develop any new or worsening of symptoms  Reviewed expectations re: course of current medical issues. Questions answered. Outlined signs and symptoms indicating need for more acute intervention. Patient verbalized understanding. After Visit Summary  given.         Emerson Monte, [place] 12/31/20 1610

## 2020-12-31 NOTE — ED Triage Notes (Signed)
Pt presents with c/o left foot swelling for past few days , denies injury

## 2020-12-31 NOTE — Discharge Instructions (Addendum)
Take medication as prescribed Follow-up with PCP Prop your legs up to help decrease swelling to gravity Return or go to ED if you develop any new or worsening of symptoms

## 2021-01-04 ENCOUNTER — Other Ambulatory Visit: Payer: Self-pay

## 2021-01-04 ENCOUNTER — Ambulatory Visit (HOSPITAL_COMMUNITY)
Admission: RE | Admit: 2021-01-04 | Discharge: 2021-01-04 | Disposition: A | Discharge status: Home / Self Care | Payer: Medicare HMO | Source: Ambulatory Visit | Attending: Internal Medicine | Admitting: Internal Medicine

## 2021-01-04 DIAGNOSIS — Z1231 Encounter for screening mammogram for malignant neoplasm of breast: Secondary | ICD-10-CM | POA: Diagnosis not present

## 2021-01-18 ENCOUNTER — Other Ambulatory Visit: Payer: Self-pay | Admitting: Gastroenterology

## 2021-01-31 ENCOUNTER — Ambulatory Visit: Payer: Medicare HMO | Admitting: Podiatry

## 2021-02-16 DIAGNOSIS — K219 Gastro-esophageal reflux disease without esophagitis: Secondary | ICD-10-CM | POA: Diagnosis not present

## 2021-02-16 DIAGNOSIS — I10 Essential (primary) hypertension: Secondary | ICD-10-CM | POA: Diagnosis not present

## 2021-02-16 DIAGNOSIS — Z79899 Other long term (current) drug therapy: Secondary | ICD-10-CM | POA: Diagnosis not present

## 2021-02-21 ENCOUNTER — Other Ambulatory Visit: Payer: Self-pay | Admitting: Gastroenterology

## 2021-02-23 DIAGNOSIS — I10 Essential (primary) hypertension: Secondary | ICD-10-CM | POA: Diagnosis not present

## 2021-02-23 DIAGNOSIS — E785 Hyperlipidemia, unspecified: Secondary | ICD-10-CM | POA: Diagnosis not present

## 2021-02-23 DIAGNOSIS — K219 Gastro-esophageal reflux disease without esophagitis: Secondary | ICD-10-CM | POA: Diagnosis not present

## 2021-02-27 DIAGNOSIS — K219 Gastro-esophageal reflux disease without esophagitis: Secondary | ICD-10-CM | POA: Diagnosis not present

## 2021-03-13 ENCOUNTER — Other Ambulatory Visit: Payer: Self-pay | Admitting: Gastroenterology

## 2021-03-13 NOTE — Telephone Encounter (Signed)
Would you still like to continue to refill PPI? Patient has not been seen in over a year and actively sees [facility]. Please advise.

## 2021-03-14 NOTE — Telephone Encounter (Signed)
I don't mind refilling this, I have sent her there for their opinion. Thanks

## 2021-03-21 ENCOUNTER — Other Ambulatory Visit: Payer: Self-pay | Admitting: Gastroenterology

## 2021-03-21 DIAGNOSIS — Z026 Encounter for examination for insurance purposes: Secondary | ICD-10-CM | POA: Diagnosis not present

## 2021-03-23 DIAGNOSIS — R2242 Localized swelling, mass and lump, left lower limb: Secondary | ICD-10-CM | POA: Diagnosis not present

## 2021-04-21 ENCOUNTER — Other Ambulatory Visit: Payer: Self-pay | Admitting: Gastroenterology

## 2021-05-09 DIAGNOSIS — H5203 Hypermetropia, bilateral: Secondary | ICD-10-CM | POA: Diagnosis not present

## 2021-05-22 ENCOUNTER — Other Ambulatory Visit: Payer: Self-pay | Admitting: Gastroenterology

## 2021-05-25 DIAGNOSIS — R531 Weakness: Secondary | ICD-10-CM | POA: Diagnosis not present

## 2021-05-25 DIAGNOSIS — R1084 Generalized abdominal pain: Secondary | ICD-10-CM | POA: Diagnosis not present

## 2021-05-29 DIAGNOSIS — L851 Acquired keratosis [keratoderma] palmaris et plantaris: Secondary | ICD-10-CM | POA: Diagnosis not present

## 2021-05-29 DIAGNOSIS — M79674 Pain in right toe(s): Secondary | ICD-10-CM | POA: Diagnosis not present

## 2021-05-29 DIAGNOSIS — M79675 Pain in left toe(s): Secondary | ICD-10-CM | POA: Diagnosis not present

## 2021-06-01 DIAGNOSIS — R1084 Generalized abdominal pain: Secondary | ICD-10-CM | POA: Diagnosis not present

## 2021-06-01 DIAGNOSIS — R531 Weakness: Secondary | ICD-10-CM | POA: Diagnosis not present

## 2021-07-01 ENCOUNTER — Other Ambulatory Visit: Payer: Self-pay | Admitting: Gastroenterology

## 2021-07-27 ENCOUNTER — Other Ambulatory Visit: Payer: Self-pay | Admitting: Gastroenterology

## 2021-08-27 ENCOUNTER — Other Ambulatory Visit: Payer: Self-pay | Admitting: Gastroenterology

## 2021-08-28 DIAGNOSIS — K219 Gastro-esophageal reflux disease without esophagitis: Secondary | ICD-10-CM | POA: Diagnosis not present

## 2021-08-28 DIAGNOSIS — Z23 Encounter for immunization: Secondary | ICD-10-CM | POA: Diagnosis not present

## 2021-08-28 DIAGNOSIS — I10 Essential (primary) hypertension: Secondary | ICD-10-CM | POA: Diagnosis not present

## 2021-08-30 ENCOUNTER — Other Ambulatory Visit: Payer: Self-pay | Admitting: Gastroenterology

## 2021-09-04 ENCOUNTER — Telehealth: Payer: Self-pay | Admitting: Gastroenterology

## 2021-09-04 MED ORDER — SUCRALFATE 1 G PO TABS: ORAL_TABLET | ORAL | 0 refills | Status: DC

## 2021-09-04 NOTE — Telephone Encounter (Signed)
Script was sent to [facility] on 9-15.  Called and cancelled Rx at [facility] and resent to [facility] per patient request.

## 2021-09-04 NOTE — Telephone Encounter (Signed)
Inbound call from patient requesting a refill for sucralfate sent to [facility] in [place].

## 2021-09-08 ENCOUNTER — Other Ambulatory Visit: Payer: Self-pay | Admitting: Gastroenterology

## 2021-09-18 DIAGNOSIS — M79674 Pain in right toe(s): Secondary | ICD-10-CM | POA: Diagnosis not present

## 2021-09-18 DIAGNOSIS — M79675 Pain in left toe(s): Secondary | ICD-10-CM | POA: Diagnosis not present

## 2021-09-18 DIAGNOSIS — L851 Acquired keratosis [keratoderma] palmaris et plantaris: Secondary | ICD-10-CM | POA: Diagnosis not present

## 2021-09-29 ENCOUNTER — Other Ambulatory Visit: Payer: Self-pay | Admitting: Gastroenterology

## 2021-10-29 ENCOUNTER — Other Ambulatory Visit: Payer: Self-pay | Admitting: Gastroenterology

## 2021-11-27 ENCOUNTER — Other Ambulatory Visit: Payer: Self-pay | Admitting: Gastroenterology

## 2021-11-27 DIAGNOSIS — M79674 Pain in right toe(s): Secondary | ICD-10-CM | POA: Diagnosis not present

## 2021-11-27 DIAGNOSIS — M79675 Pain in left toe(s): Secondary | ICD-10-CM | POA: Diagnosis not present

## 2021-11-27 DIAGNOSIS — L851 Acquired keratosis [keratoderma] palmaris et plantaris: Secondary | ICD-10-CM | POA: Diagnosis not present

## 2021-11-29 ENCOUNTER — Telehealth: Payer: Self-pay | Admitting: Gastroenterology

## 2021-11-29 NOTE — Telephone Encounter (Signed)
Called and spoke to patient.  Explained to her that since we have not seen her in almost 2 years and she actively sees [facility] that they should manage her mediations. Patient expressed understanding and will contact Dr. Cyndi Lennert office. She has an appointment with him again next month.

## 2021-11-29 NOTE — Telephone Encounter (Signed)
Patient called requesting a refill of sucralfate.  She said she is almost out.

## 2021-12-01 ENCOUNTER — Other Ambulatory Visit: Payer: Self-pay | Admitting: Gastroenterology

## 2021-12-15 DIAGNOSIS — K219 Gastro-esophageal reflux disease without esophagitis: Secondary | ICD-10-CM | POA: Diagnosis not present

## 2021-12-15 DIAGNOSIS — I10 Essential (primary) hypertension: Secondary | ICD-10-CM | POA: Diagnosis not present

## 2021-12-25 ENCOUNTER — Other Ambulatory Visit (HOSPITAL_COMMUNITY): Payer: Self-pay | Admitting: Internal Medicine

## 2021-12-25 DIAGNOSIS — Z1231 Encounter for screening mammogram for malignant neoplasm of breast: Secondary | ICD-10-CM

## 2022-01-05 ENCOUNTER — Other Ambulatory Visit: Payer: Self-pay

## 2022-01-05 ENCOUNTER — Ambulatory Visit (HOSPITAL_COMMUNITY)
Admission: RE | Admit: 2022-01-05 | Discharge: 2022-01-05 | Disposition: A | Discharge status: Home / Self Care | Payer: Medicare HMO | Source: Ambulatory Visit | Attending: Internal Medicine | Admitting: Internal Medicine

## 2022-01-05 DIAGNOSIS — Z1231 Encounter for screening mammogram for malignant neoplasm of breast: Secondary | ICD-10-CM | POA: Diagnosis not present

## 2022-01-15 DIAGNOSIS — R12 Heartburn: Secondary | ICD-10-CM | POA: Diagnosis not present

## 2022-01-29 DIAGNOSIS — M79674 Pain in right toe(s): Secondary | ICD-10-CM | POA: Diagnosis not present

## 2022-01-29 DIAGNOSIS — L851 Acquired keratosis [keratoderma] palmaris et plantaris: Secondary | ICD-10-CM | POA: Diagnosis not present

## 2022-01-29 DIAGNOSIS — M79675 Pain in left toe(s): Secondary | ICD-10-CM | POA: Diagnosis not present

## 2022-02-16 ENCOUNTER — Ambulatory Visit
Admission: EM | Admit: 2022-02-16 | Discharge: 2022-02-16 | Disposition: A | Discharge status: Home / Self Care | Payer: Medicare HMO

## 2022-02-16 ENCOUNTER — Other Ambulatory Visit: Payer: Self-pay

## 2022-02-16 DIAGNOSIS — H1132 Conjunctival hemorrhage, left eye: Secondary | ICD-10-CM | POA: Diagnosis not present

## 2022-02-16 NOTE — ED Triage Notes (Signed)
Pt reports left eye is red sine this morning when she woke up. Denies pain, discharge.  ?

## 2022-02-16 NOTE — ED Provider Notes (Signed)
?[place] ? ? ?MRN: [id number] DOB: March 23, 1956 ? ?Subjective:  ? ?Dawn Stafford is a 66 y.o. female presenting for 1 day history of redness from the left eye.  Patient states that she woke up with the symptoms.  Has been having a lot of burping and regurgitation, coughing up when she eats.  Reports that this is not uncommon for her has a longstanding history of GERD, dysphagia, regurgitation and re-chewing.  No vision change, eye trauma, loss of vision, photophobia, swelling, tenderness, drainage. ? ?No current facility-administered medications for this encounter. ? ?Current Outpatient Medications:  ?  amLODipine (NORVASC) 5 MG tablet, Take 5 mg by mouth daily., Disp: , Rfl:  ?  desipramine (NORPRAMIN) 25 MG tablet, Take by mouth., Disp: , Rfl:  ?  diclofenac (VOLTAREN) 50 MG EC tablet, 1 tablet as needed., Disp: , Rfl:  ?  famotidine (PEPCID) 20 MG tablet, Take 20 mg by mouth as needed for heartburn or indigestion., Disp: , Rfl:  ?  hyoscyamine (LEVSIN) 0.125 MG/5ML ELIX, Take 0.125 mg by mouth. Began 12/31/2020, Disp: , Rfl:  ?  metoCLOPramide (REGLAN) 5 MG tablet, Take 1 tablet (5 mg total) by mouth 3 (three) times daily., Disp: 140 tablet, Rfl: 0 ?  Multiple Vitamin (MULTIVITAMIN) tablet, 1 tablet. Patient states that she takes for 4 weeks then will stop so that her body can take a break., Disp: , Rfl:  ?  Multiple Vitamins-Minerals (ONE DAILY CALCIUM/IRON) TABS, 1 tablet., Disp: , Rfl:  ?  pantoprazole (PROTONIX) 40 MG tablet, TAKE 1 TABLET BY MOUTH ONCE DAILY IN THE MORNING, Disp: 90 tablet, Rfl: 0 ?  simvastatin (ZOCOR) 20 MG tablet, Take 20 mg by mouth every evening. , Disp: , Rfl:  ?  sucralfate (CARAFATE) 1 g tablet, TAKE 1 TABLET BY MOUTH EVERY 6 HOURS AS NEEDED --  SLOWLY  DISSOLVE  IN  15  ML  OF  WATER  TO  MAKE  A  SLURRY  BEFORE  TAKING, Disp: 90 tablet, Rfl: 0  ? ?No Known Allergies ? ?Past Medical History:  ?Diagnosis Date  ? Acid reflux 11/07/2010  ? GERD (gastroesophageal  reflux disease)   ? Hyperlipidemia   ? Hypertension 11/07/2010  ? Rumination   ?  ? ?Past Surgical History:  ?Procedure Laterality Date  ? 77 HOUR [place] STUDY N/A 03/03/2018  ? Procedure: 24 HOUR PH STUDY (on PPI);  Surgeon: Mauri Pole, MD;  Location: [facility];  Service: Endoscopy;  Laterality: N/A;  ? BRAVO [place] STUDY  01/10/2011  ? [name]  ? COLONOSCOPY    ? 04/2013  ? ESOPHAGEAL MANOMETRY N/A 03/03/2018  ? Procedure: ESOPHAGEAL MANOMETRY (EM);  Surgeon: Mauri Pole, MD;  Location: [facility];  Service: Endoscopy;  Laterality: N/A;  ? ESOPHAGOGASTRODUODENOSCOPY  10/2010  ? [place] IMPEDANCE STUDY N/A 03/03/2018  ? Procedure: PH IMPEDANCE STUDY (on PPI);  Surgeon: Mauri Pole, MD;  Location: [facility];  Service: Endoscopy;  Laterality: N/A;  ? TUBAL LIGATION    ? ? ?Family History  ?Problem Relation Age of Onset  ? Stroke Mother   ? Kidney disease Sister   ? Stroke Sister   ? Diabetes Sister   ? Diabetes Brother   ? Kidney failure Brother   ? Other Maternal Grandmother   ?     poor circulation  ? Hypertension Maternal Grandmother   ? Heart disease Maternal Grandmother   ? Colon cancer Neg Hx   ? Stomach cancer Neg  Hx   ? Pancreatic cancer Neg Hx   ? Throat cancer Neg Hx   ? ? ?Social History  ? ?Tobacco Use  ? Smoking status: Never  ? Smokeless tobacco: Never  ?Vaping Use  ? Vaping Use: Never used  ?Substance Use Topics  ? Alcohol use: No  ?  Alcohol/week: 0.0 standard drinks  ? Drug use: No  ? ? ?ROS ? ? ?Objective:  ? ?Vitals: ?There were no vitals taken for this visit. ? ?Physical Exam ?Constitutional:   ?   General: She is not in acute distress. ?   Appearance: Normal appearance. She is well-developed. She is not ill-appearing, toxic-appearing or diaphoretic.  ?HENT:  ?   Head: Normocephalic and atraumatic.  ?   Nose: Nose normal.  ?   Mouth/Throat:  ?   Mouth: Mucous membranes are moist.  ?Eyes:  ?   General: Lids are everted, no foreign bodies appreciated. No scleral icterus.    ?    Right eye: No foreign body, discharge or hordeolum.     ?   Left eye: No foreign body, discharge or hordeolum.  ?   Extraocular Movements: Extraocular movements intact.  ?   Conjunctiva/sclera:  ?   Right eye: Right conjunctiva is not injected. No chemosis, exudate or hemorrhage. ?   Left eye: Left conjunctiva is not injected. Hemorrhage (medial to mid conjunctiva) present. No chemosis or exudate. ?   Comments: No hyphema.  ?Cardiovascular:  ?   Rate and Rhythm: Normal rate.  ?Pulmonary:  ?   Effort: Pulmonary effort is normal.  ?Skin: ?   General: Skin is warm and dry.  ?Neurological:  ?   General: No focal deficit present.  ?   Mental Status: She is alert and oriented to person, place, and time.  ?Psychiatric:     ?   Mood and Affect: Mood normal.     ?   Behavior: Behavior normal.  ? ? ?Assessment and Plan :  ? ?PDMP not reviewed this encounter. ? ?1. Subconjunctival hemorrhage of left eye   ? ?Recommended conservative management for a subconjunctival hemorrhage.  No signs of an acute ophthalmologic emergency.  Counseled patient on potential for adverse effects with medications prescribed/recommended today, ER and return-to-clinic precautions discussed, patient verbalized understanding. ? ?  ?Jaynee Eagles, PA-C ?02/16/22 1042 ? ?

## 2022-02-19 DIAGNOSIS — Z79899 Other long term (current) drug therapy: Secondary | ICD-10-CM | POA: Diagnosis not present

## 2022-02-19 DIAGNOSIS — I10 Essential (primary) hypertension: Secondary | ICD-10-CM | POA: Diagnosis not present

## 2022-02-19 DIAGNOSIS — K219 Gastro-esophageal reflux disease without esophagitis: Secondary | ICD-10-CM | POA: Diagnosis not present

## 2022-02-26 ENCOUNTER — Other Ambulatory Visit (HOSPITAL_COMMUNITY): Payer: Self-pay | Admitting: Internal Medicine

## 2022-02-26 DIAGNOSIS — E785 Hyperlipidemia, unspecified: Secondary | ICD-10-CM | POA: Diagnosis not present

## 2022-02-26 DIAGNOSIS — K219 Gastro-esophageal reflux disease without esophagitis: Secondary | ICD-10-CM | POA: Diagnosis not present

## 2022-02-26 DIAGNOSIS — Z780 Asymptomatic menopausal state: Secondary | ICD-10-CM

## 2022-02-26 DIAGNOSIS — I10 Essential (primary) hypertension: Secondary | ICD-10-CM | POA: Diagnosis not present

## 2022-03-05 ENCOUNTER — Ambulatory Visit (HOSPITAL_COMMUNITY)
Admission: RE | Admit: 2022-03-05 | Discharge: 2022-03-05 | Disposition: A | Discharge status: Home / Self Care | Payer: Medicare HMO | Source: Ambulatory Visit | Attending: Internal Medicine | Admitting: Internal Medicine

## 2022-03-05 ENCOUNTER — Other Ambulatory Visit: Payer: Self-pay

## 2022-03-05 DIAGNOSIS — Z780 Asymptomatic menopausal state: Secondary | ICD-10-CM | POA: Diagnosis not present

## 2022-04-30 DIAGNOSIS — M79675 Pain in left toe(s): Secondary | ICD-10-CM | POA: Diagnosis not present

## 2022-04-30 DIAGNOSIS — L989 Disorder of the skin and subcutaneous tissue, unspecified: Secondary | ICD-10-CM | POA: Diagnosis not present

## 2022-04-30 DIAGNOSIS — L851 Acquired keratosis [keratoderma] palmaris et plantaris: Secondary | ICD-10-CM | POA: Diagnosis not present

## 2022-06-05 DIAGNOSIS — H5200 Hypermetropia, unspecified eye: Secondary | ICD-10-CM | POA: Diagnosis not present

## 2022-06-05 DIAGNOSIS — Z0100 Encounter for examination of eyes and vision without abnormal findings: Secondary | ICD-10-CM | POA: Diagnosis not present

## 2022-07-16 DIAGNOSIS — M79675 Pain in left toe(s): Secondary | ICD-10-CM | POA: Diagnosis not present

## 2022-07-16 DIAGNOSIS — M79674 Pain in right toe(s): Secondary | ICD-10-CM | POA: Diagnosis not present

## 2022-08-13 DIAGNOSIS — M79675 Pain in left toe(s): Secondary | ICD-10-CM | POA: Diagnosis not present

## 2022-08-13 DIAGNOSIS — L989 Disorder of the skin and subcutaneous tissue, unspecified: Secondary | ICD-10-CM | POA: Diagnosis not present

## 2022-08-13 DIAGNOSIS — M79674 Pain in right toe(s): Secondary | ICD-10-CM | POA: Diagnosis not present

## 2022-11-04 ENCOUNTER — Encounter (INDEPENDENT_AMBULATORY_CARE_PROVIDER_SITE_OTHER): Payer: Self-pay | Admitting: Gastroenterology

## 2022-11-23 DIAGNOSIS — K219 Gastro-esophageal reflux disease without esophagitis: Secondary | ICD-10-CM | POA: Diagnosis not present

## 2022-11-23 DIAGNOSIS — I10 Essential (primary) hypertension: Secondary | ICD-10-CM | POA: Diagnosis not present

## 2022-12-25 ENCOUNTER — Other Ambulatory Visit (HOSPITAL_COMMUNITY): Payer: Self-pay | Admitting: Internal Medicine

## 2022-12-25 DIAGNOSIS — Z1231 Encounter for screening mammogram for malignant neoplasm of breast: Secondary | ICD-10-CM

## 2023-01-07 ENCOUNTER — Ambulatory Visit (HOSPITAL_COMMUNITY)
Admission: RE | Admit: 2023-01-07 | Discharge: 2023-01-07 | Disposition: A | Discharge status: Home / Self Care | Payer: Medicare HMO | Source: Ambulatory Visit | Attending: Internal Medicine | Admitting: Internal Medicine

## 2023-01-07 DIAGNOSIS — Z1231 Encounter for screening mammogram for malignant neoplasm of breast: Secondary | ICD-10-CM | POA: Diagnosis not present

## 2023-01-07 DIAGNOSIS — L851 Acquired keratosis [keratoderma] palmaris et plantaris: Secondary | ICD-10-CM | POA: Diagnosis not present

## 2023-01-07 DIAGNOSIS — Q828 Other specified congenital malformations of skin: Secondary | ICD-10-CM | POA: Diagnosis not present

## 2023-01-07 DIAGNOSIS — M7740 Metatarsalgia, unspecified foot: Secondary | ICD-10-CM | POA: Diagnosis not present

## 2023-01-07 DIAGNOSIS — M79674 Pain in right toe(s): Secondary | ICD-10-CM | POA: Diagnosis not present

## 2023-01-07 DIAGNOSIS — M79675 Pain in left toe(s): Secondary | ICD-10-CM | POA: Diagnosis not present

## 2023-03-25 DIAGNOSIS — M79674 Pain in right toe(s): Secondary | ICD-10-CM | POA: Diagnosis not present

## 2023-03-25 DIAGNOSIS — M7740 Metatarsalgia, unspecified foot: Secondary | ICD-10-CM | POA: Diagnosis not present

## 2023-03-25 DIAGNOSIS — M79675 Pain in left toe(s): Secondary | ICD-10-CM | POA: Diagnosis not present

## 2023-03-25 DIAGNOSIS — L851 Acquired keratosis [keratoderma] palmaris et plantaris: Secondary | ICD-10-CM | POA: Diagnosis not present

## 2023-03-25 DIAGNOSIS — L989 Disorder of the skin and subcutaneous tissue, unspecified: Secondary | ICD-10-CM | POA: Diagnosis not present

## 2023-05-21 DIAGNOSIS — K219 Gastro-esophageal reflux disease without esophagitis: Secondary | ICD-10-CM | POA: Diagnosis not present

## 2023-05-21 DIAGNOSIS — I10 Essential (primary) hypertension: Secondary | ICD-10-CM | POA: Diagnosis not present

## 2023-05-21 DIAGNOSIS — E785 Hyperlipidemia, unspecified: Secondary | ICD-10-CM | POA: Diagnosis not present

## 2023-05-21 DIAGNOSIS — M1990 Unspecified osteoarthritis, unspecified site: Secondary | ICD-10-CM | POA: Diagnosis not present

## 2023-05-21 DIAGNOSIS — Z79899 Other long term (current) drug therapy: Secondary | ICD-10-CM | POA: Diagnosis not present

## 2023-05-28 DIAGNOSIS — I10 Essential (primary) hypertension: Secondary | ICD-10-CM | POA: Diagnosis not present

## 2023-05-28 DIAGNOSIS — E785 Hyperlipidemia, unspecified: Secondary | ICD-10-CM | POA: Diagnosis not present

## 2023-06-03 DIAGNOSIS — M79675 Pain in left toe(s): Secondary | ICD-10-CM | POA: Diagnosis not present

## 2023-06-03 DIAGNOSIS — M7742 Metatarsalgia, left foot: Secondary | ICD-10-CM | POA: Diagnosis not present

## 2023-06-03 DIAGNOSIS — M7741 Metatarsalgia, right foot: Secondary | ICD-10-CM | POA: Diagnosis not present

## 2023-06-03 DIAGNOSIS — M79674 Pain in right toe(s): Secondary | ICD-10-CM | POA: Diagnosis not present

## 2023-06-03 DIAGNOSIS — L851 Acquired keratosis [keratoderma] palmaris et plantaris: Secondary | ICD-10-CM | POA: Diagnosis not present

## 2023-06-10 DIAGNOSIS — H5203 Hypermetropia, bilateral: Secondary | ICD-10-CM | POA: Diagnosis not present

## 2023-07-16 ENCOUNTER — Other Ambulatory Visit (INDEPENDENT_AMBULATORY_CARE_PROVIDER_SITE_OTHER): Payer: Medicare HMO

## 2023-07-16 ENCOUNTER — Encounter: Payer: Self-pay | Admitting: Orthopaedic Surgery

## 2023-07-16 ENCOUNTER — Ambulatory Visit: Payer: Medicare HMO | Admitting: Orthopaedic Surgery

## 2023-07-16 VITALS — BP 145/80 | HR 67 | Ht 66.0 in | Wt 132.6 lb

## 2023-07-16 DIAGNOSIS — M5432 Sciatica, left side: Secondary | ICD-10-CM | POA: Diagnosis not present

## 2023-07-16 DIAGNOSIS — M545 Low back pain, unspecified: Secondary | ICD-10-CM

## 2023-07-16 MED ORDER — CYCLOBENZAPRINE HCL 10 MG PO TABS: 10.0000 mg | ORAL_TABLET | Freq: Every day | ORAL | 0 refills | Status: DC

## 2023-07-16 MED ORDER — HYDROCODONE-ACETAMINOPHEN 5-325 MG PO TABS: 1.0000 | ORAL_TABLET | ORAL | 0 refills | Status: AC | PRN

## 2023-07-16 MED ORDER — DICLOFENAC SODIUM 75 MG PO TBEC: 75.0000 mg | DELAYED_RELEASE_TABLET | Freq: Two times a day (BID) | ORAL | 2 refills | Status: DC

## 2023-07-16 NOTE — Progress Notes (Signed)
Subjective:    Patient ID: Dawn Stafford, female    DOB: 1956/05/26, 67 y.o.   MRN: [id number]  HPI She has had lower back pain with radiation down the left leg for about six weeks or so.  She has seen Dr. Ouida Sills for this and has been on diclofenac.  She said it helped some but stopped it two weeks ago.  She has pain that goes to the lateral left foot. She has no trauma, no weakness.  It bothers her after a long day.  She has no bowel or bladder problems.  Review of Systems  Constitutional:  Positive for activity change.  Musculoskeletal:  Positive for arthralgias and back pain.  All other systems reviewed and are negative. For Review of Systems, all other systems reviewed and are negative.  The following is a summary of the past history medically, past history surgically, known current medicines, social history and family history.  This information is gathered electronically by the computer from prior information and documentation.  I review this each visit and have found including this information at this point in the chart is beneficial and informative.   Past Medical History:  Diagnosis Date   Acid reflux 11/07/2010   GERD (gastroesophageal reflux disease)    Hyperlipidemia    Hypertension 11/07/2010   Rumination     Past Surgical History:  Procedure Laterality Date   24 HOUR PH STUDY N/A 03/03/2018   Procedure: 24 HOUR PH STUDY (on PPI);  Surgeon: Napoleon Form, MD;  Location: [facility];  Service: Endoscopy;  Laterality: N/A;   BRAVO PH STUDY  01/10/2011   [name]   COLONOSCOPY     04/2013   ESOPHAGEAL MANOMETRY N/A 03/03/2018   Procedure: ESOPHAGEAL MANOMETRY (EM);  Surgeon: Napoleon Form, MD;  Location: [facility];  Service: Endoscopy;  Laterality: N/A;   ESOPHAGOGASTRODUODENOSCOPY  10/2010   PH IMPEDANCE STUDY N/A 03/03/2018   Procedure: PH IMPEDANCE STUDY (on PPI);  Surgeon: Napoleon Form, MD;  Location: [facility];  Service: Endoscopy;   Laterality: N/A;   TUBAL LIGATION      Current Outpatient Medications on File Prior to Visit  Medication Sig Dispense Refill   amLODipine (NORVASC) 5 MG tablet Take 5 mg by mouth daily.     desipramine (NORPRAMIN) 25 MG tablet Take by mouth.     diclofenac (VOLTAREN) 50 MG EC tablet 1 tablet as needed.     famotidine (PEPCID) 20 MG tablet Take 20 mg by mouth as needed for heartburn or indigestion.     hyoscyamine (LEVSIN) 0.125 MG/5ML ELIX Take 0.125 mg by mouth. Began 12/31/2020     metoCLOPramide (REGLAN) 5 MG tablet Take 1 tablet (5 mg total) by mouth 3 (three) times daily. 140 tablet 0   Multiple Vitamin (MULTIVITAMIN) tablet 1 tablet. Patient states that she takes for 4 weeks then will stop so that her body can take a break.     Multiple Vitamins-Minerals (ONE DAILY CALCIUM/IRON) TABS 1 tablet.     pantoprazole (PROTONIX) 40 MG tablet TAKE 1 TABLET BY MOUTH ONCE DAILY IN THE MORNING 90 tablet 0   simvastatin (ZOCOR) 20 MG tablet Take 20 mg by mouth every evening.      sucralfate (CARAFATE) 1 g tablet TAKE 1 TABLET BY MOUTH EVERY 6 HOURS AS NEEDED --  SLOWLY  DISSOLVE  IN  15  ML  OF  WATER  TO  MAKE  A  SLURRY  BEFORE  TAKING 90  tablet 0   No current facility-administered medications on file prior to visit.    Social History   Socioeconomic History   Marital status: Single    Spouse name: Not on file   Number of children: Not on file   Years of education: Not on file   Highest education level: Not on file  Occupational History   Not on file  Tobacco Use   Smoking status: Never   Smokeless tobacco: Never  Vaping Use   Vaping status: Never Used  Substance and Sexual Activity   Alcohol use: No    Alcohol/week: 0.0 standard drinks of alcohol   Drug use: Never   Sexual activity: Yes    Partners: Male    Birth control/protection: Post-menopausal  Other Topics Concern   Not on file  Social History Narrative   Not on file   Social Determinants of Health   Financial  Resource Strain: Not on file  Food Insecurity: Not on file  Transportation Needs: Not on file  Physical Activity: Not on file  Stress: Not on file  Social Connections: Not on file  Intimate Partner Violence: Not on file    Family History  Problem Relation Age of Onset   Stroke Mother    Kidney disease Sister    Stroke Sister    Diabetes Sister    Diabetes Brother    Kidney failure Brother    Other Maternal Grandmother        poor circulation   Hypertension Maternal Grandmother    Heart disease Maternal Grandmother    Colon cancer Neg Hx    Stomach cancer Neg Hx    Pancreatic cancer Neg Hx    Throat cancer Neg Hx     BP (!) 145/80   Pulse 67   Ht 5\' 6"  (1.676 m)   Wt 132 lb 9.6 oz (60.1 kg)   BMI 21.40 kg/m   Body mass index is 21.4 kg/m.      Objective:   Physical Exam Vitals and nursing note reviewed. Exam conducted with a chaperone present.  Constitutional:      Appearance: She is well-developed.  HENT:     Head: Normocephalic and atraumatic.  Eyes:     Conjunctiva/sclera: Conjunctivae normal.     Pupils: Pupils are equal, round, and reactive to light.  Cardiovascular:     Rate and Rhythm: Normal rate and regular rhythm.  Pulmonary:     Effort: Pulmonary effort is normal.  Abdominal:     Palpations: Abdomen is soft.  Musculoskeletal:       Arms:     Cervical back: Normal range of motion and neck supple.  Skin:    General: Skin is warm and dry.  Neurological:     Mental Status: She is alert and oriented to person, place, and time.     Cranial Nerves: No cranial nerve deficit.     Motor: No abnormal muscle tone.     Coordination: Coordination normal.     Deep Tendon Reflexes: Reflexes are normal and symmetric. Reflexes normal.  Psychiatric:        Behavior: Behavior normal.        Thought Content: Thought content normal.        Judgment: Judgment normal.   X-rays were done of the lumbar spine, reported separately.        Assessment & Plan:    Encounter Diagnoses  Name Primary?   Lumbar pain Yes   Sciatica, left side  I will resume her diclofenac.  I will get MRI of the lumbar spine.  Begin Flexeril and pain medicine.  I have reviewed the [place] Controlled Substance Reporting System web site prior to prescribing narcotic medicine for this patient.  Return in one month.  Call if any problem.  Precautions discussed.  Electronically Signed Darreld Mclean, MD 7/30/20249:12 AM

## 2023-07-16 NOTE — Patient Instructions (Signed)
While we are working on your approval for MRI please go ahead and call to schedule your appointment with [facility] within at least one (1) week.   Central Scheduling [phone]  

## 2023-08-02 ENCOUNTER — Telehealth: Payer: Self-pay | Admitting: Orthopaedic Surgery

## 2023-08-02 NOTE — Telephone Encounter (Signed)
Im looking at it now

## 2023-08-02 NOTE — Telephone Encounter (Signed)
Dr. Sanjuan Dame pt - Dawn w/[facility] [phone] ext [phone] lvm stating that this patient has a MRI of the lumbar spine on Monday and it requires a prior authorization, pt has [employer].

## 2023-08-05 ENCOUNTER — Ambulatory Visit (HOSPITAL_COMMUNITY)
Admission: RE | Admit: 2023-08-05 | Discharge: 2023-08-05 | Disposition: A | Discharge status: Home / Self Care | Payer: Medicare HMO | Source: Ambulatory Visit | Attending: Orthopaedic Surgery | Admitting: Orthopaedic Surgery

## 2023-08-05 ENCOUNTER — Telehealth: Payer: Self-pay | Admitting: Radiology

## 2023-08-05 DIAGNOSIS — M4727 Other spondylosis with radiculopathy, lumbosacral region: Secondary | ICD-10-CM | POA: Diagnosis not present

## 2023-08-05 DIAGNOSIS — L989 Disorder of the skin and subcutaneous tissue, unspecified: Secondary | ICD-10-CM | POA: Diagnosis not present

## 2023-08-05 DIAGNOSIS — L851 Acquired keratosis [keratoderma] palmaris et plantaris: Secondary | ICD-10-CM | POA: Diagnosis not present

## 2023-08-05 DIAGNOSIS — M5432 Sciatica, left side: Secondary | ICD-10-CM | POA: Insufficient documentation

## 2023-08-05 DIAGNOSIS — M7742 Metatarsalgia, left foot: Secondary | ICD-10-CM | POA: Diagnosis not present

## 2023-08-05 DIAGNOSIS — M7741 Metatarsalgia, right foot: Secondary | ICD-10-CM | POA: Diagnosis not present

## 2023-08-05 DIAGNOSIS — M4726 Other spondylosis with radiculopathy, lumbar region: Secondary | ICD-10-CM | POA: Diagnosis not present

## 2023-08-05 DIAGNOSIS — M545 Low back pain, unspecified: Secondary | ICD-10-CM | POA: Insufficient documentation

## 2023-08-05 DIAGNOSIS — M5116 Intervertebral disc disorders with radiculopathy, lumbar region: Secondary | ICD-10-CM | POA: Diagnosis not present

## 2023-08-05 DIAGNOSIS — M79675 Pain in left toe(s): Secondary | ICD-10-CM | POA: Diagnosis not present

## 2023-08-05 DIAGNOSIS — M79674 Pain in right toe(s): Secondary | ICD-10-CM | POA: Diagnosis not present

## 2023-08-05 NOTE — Telephone Encounter (Signed)
Patient came into office, says since taking Diclofenac 75mg , her feet have been swelling.  She has 50mg  from PCP, she will stop the 75mg , and take the 50mg .  She has f/u appt with Dr Hilda Lias on Tuesday next week, will discuss.  FYI

## 2023-08-06 ENCOUNTER — Telehealth: Payer: Self-pay

## 2023-08-13 ENCOUNTER — Ambulatory Visit: Payer: Medicare HMO | Admitting: Orthopaedic Surgery

## 2023-08-13 MED ORDER — DICLOFENAC SODIUM 50 MG PO TBEC: 50.0000 mg | DELAYED_RELEASE_TABLET | Freq: Two times a day (BID) | ORAL | 2 refills | Status: DC

## 2023-08-14 ENCOUNTER — Ambulatory Visit: Payer: Medicare HMO | Admitting: Orthopaedic Surgery

## 2023-08-14 ENCOUNTER — Telehealth: Payer: Self-pay | Admitting: Orthopaedic Surgery

## 2023-08-14 ENCOUNTER — Encounter: Payer: Self-pay | Admitting: Orthopaedic Surgery

## 2023-08-14 VITALS — BP 152/71 | HR 72 | Ht 66.0 in | Wt 132.0 lb

## 2023-08-14 DIAGNOSIS — M545 Low back pain, unspecified: Secondary | ICD-10-CM

## 2023-08-14 NOTE — Telephone Encounter (Signed)
Patient left her appt today w/out checking out, I called lvm for her to call and scheduled her return visit, needs one for 10/09

## 2023-08-14 NOTE — Addendum Note (Signed)
Addended by: Michaele Offer on: 08/14/2023 01:47 PM   Modules accepted: Orders

## 2023-08-14 NOTE — Progress Notes (Signed)
My back is sore.  She had MRI of the lumbar spine showing: IMPRESSION: 1. 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 This is most pronounced at the L2-3 level where there are joint effusions and edema. No compressive stenosis of the canal or foramina. The facet arthritis could be associated with back pain or referred facet syndrome pain.  I have explained the findings to her.  I will have her see Dr. Alvester Morin for epidural.  I have independently reviewed the MRI.    Spine/Pelvis examination:  Inspection:  Overall, sacoiliac joint benign and hips nontender; without crepitus or defects.   Thoracic spine inspection: Alignment normal without kyphosis present   Lumbar spine inspection:  Alignment  with normal lumbar lordosis, without scoliosis apparent.   Thoracic spine palpation:  without tenderness of spinal processes   Lumbar spine palpation: without tenderness of lumbar area; without tightness of lumbar muscles    Range of Motion:   Lumbar flexion, forward flexion is normal without pain or tenderness    Lumbar extension is full without pain or tenderness   Left lateral bend is normal without pain or tenderness   Right lateral bend is normal without pain or tenderness   Straight leg raising is normal  Strength & tone: normal   Stability overall normal stability  Encounter Diagnosis  Name Primary?   Lumbar pain Yes   To see Dr. Alvester Morin for epidural.  Return in six weeks.  Call if any problem.  Precautions discussed.  Electronically Signed Darreld Mclean, MD 8/28/20248:28 AM

## 2023-08-15 NOTE — Telephone Encounter (Signed)
errir 

## 2023-08-18 ENCOUNTER — Telehealth: Payer: Self-pay | Admitting: Orthopaedic Surgery

## 2023-08-22 ENCOUNTER — Encounter: Payer: Self-pay | Admitting: Physical Medicine and Rehabilitation

## 2023-08-22 ENCOUNTER — Ambulatory Visit: Payer: Medicare HMO | Admitting: Physical Medicine and Rehabilitation

## 2023-08-22 DIAGNOSIS — M47816 Spondylosis without myelopathy or radiculopathy, lumbar region: Secondary | ICD-10-CM

## 2023-08-22 DIAGNOSIS — M545 Low back pain, unspecified: Secondary | ICD-10-CM | POA: Diagnosis not present

## 2023-08-22 DIAGNOSIS — G8929 Other chronic pain: Secondary | ICD-10-CM | POA: Diagnosis not present

## 2023-08-22 NOTE — Progress Notes (Signed)
Functional Pain Scale - descriptive words and definitions  Distracting (5)    Aware of pain/able to complete some ADL's but limited by pain/sleep is affected and active distractions are only slightly useful. Moderate range order  Average Pain 7  Lower back pain on the left side that radiates into the left leg

## 2023-08-22 NOTE — Progress Notes (Signed)
Dawn Stafford - 67 y.o. female MRN [id number]  Date of birth: [birth year]  Office Visit Note: Visit Date: 08/22/2023 PCP: Carylon Perches, MD Referred by: Carylon Perches, MD  Subjective: Chief Complaint  Patient presents with   Lower Back - Pain   HPI: Dawn Stafford is a 67 y.o. female who comes in today per the request of Dr. Darreld Mclean for evaluation of chronic, worsening and severe bilateral sided lower back pain, intermittent radiation down left lateral thigh to knee. Pain started after work related injury in 2020 where Dawn Stafford accidentally fell down stairs. Her pain worsens with laying down and prolonged sitting. Severe pain when moving from sitting to standing position. Dawn Stafford describes pain as sore and aching, currently rates as 8 out of 10. Some relief of pain with home exercise regimen, rest and use of medications. Some relief of pain with Flexeril and oral Voltaren. Recent lumbar MRI imaging exhibits dominant finding in this case is that of facet osteoarthritis from L2-L3 through L5-S1 which could be painful. No compressive stenosis of the canal or foramina. No history of lumbar surgery/injections. 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 Patient denies focal weakness, numbness and tingling. No recent trauma or falls.     Oswestry Disability Index Score 16% 0 to 10 (20%)   minimal disability: The patient can cope with most living activities. Usually no treatment is indicated apart from advice on lifting sitting and exercise.  Review of Systems  Musculoskeletal:  Positive for back pain.  Neurological:  Negative for tingling, sensory change, focal weakness and weakness.  All other systems reviewed and are negative.  Otherwise per HPI.  Assessment & Plan: Visit Diagnoses:    ICD-10-CM   1. Chronic left-sided low back pain without sciatica  M54.50 Ambulatory referral to Physical Medicine Rehab   G89.29     2. Spondylosis without myelopathy or  radiculopathy, lumbar region  M47.816 Ambulatory referral to Physical Medicine Rehab    3. Facet arthropathy, lumbar  M47.816 Ambulatory referral to Physical Medicine Rehab       Plan: Findings:  Chronic, worsening and severe bilateral lower back pain, intermittent radiation of pain down left lateral thigh to knee.  Patient continues to have severe pain despite good conservative therapy such as home exercise regimen, rest and use of medications.  Patient's clinical presentation and exam are consistent with facet mediated pain. Pain noted with lumbar extension today. There is facet arthropathy noted from L2-L3 through L5-S1, most prominent at the level of L2-L3 where there is joint effusions and edema.  I do feel her pain intermittently radiating to left thigh is more of a facet joint syndrome.  Next step is to perform diagnostic and hopefully therapeutic bilateral L2-L3 and L3-L4 facet joint injections under fluoroscopic guidance.  If good relief of pain with facet joint injections I did discuss the possibility of longer sustained pain relief with radiofrequency ablation.  I did explain injection procedure in detail today, Dawn Stafford has no questions at this time. No red flag symptoms noted upon exam today.     Meds & Orders: No orders of the defined types were placed in this encounter.   Orders Placed This Encounter  Procedures   Ambulatory referral to Physical Medicine Rehab    Follow-up: Return for Bilateral L2-L3 and L3-L4 facet joint injections.   Procedures: No procedures performed      Clinical History: CLINICAL DATA:  Lumbar radiculopathy, symptoms persist with greater than 6 weeks  of treatment. Chronic left-sided low back pain radiating down the left leg.   EXAM: MRI LUMBAR SPINE WITHOUT CONTRAST   TECHNIQUE: Multiplanar, multisequence MR imaging of the lumbar spine was performed. No intravenous contrast was administered.   COMPARISON:  Radiography 07/16/2023    FINDINGS: Segmentation:  5 lumbar type vertebral bodies.   Alignment: Mild scoliotic curvature convex to the right. 2 mm degenerative anterolisthesis L3-4, L4-5 and L5-S1.   Vertebrae: No fracture or focal bone lesion. No edematous endplate marrow changes. Some edema associated with the facet joints of the lower lumbar spine, most pronounced at the L2-3 level, which could be symptomatic.   Conus medullaris and cauda equina: Conus extends to the L1 level. Conus and cauda equina appear normal.   Paraspinal and other soft tissues: Negative   Disc levels:   T12-L1 and L1-2: Normal   L2-3: Mild bulging of the disc. Bilateral facet arthropathy with joint effusions and edema. No compressive canal or foraminal narrowing. The facet arthritis could be associated with back pain or referred facet syndrome pain.   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 Ligamentous hypertrophy. Minimal bulging of the disc. No compressive stenosis. The facet arthritis could be painful.   L4-5: Facet osteoarthritis with 2 mm of anterolisthesis. Mild bulging of the disc. No compressive stenosis. The facet arthritis could be symptomatic.   L5-S1: Facet osteoarthritis with 2 mm of anterolisthesis. Mild bulging of the disc. No compressive narrowing of the canal or foramina. The facet arthritis could be painful.   IMPRESSION: 1. 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 This is most pronounced at the L2-3 level where there are joint effusions and edema. No compressive stenosis of the canal or foramina. The facet arthritis could be associated with back pain or referred facet syndrome pain.     Electronically Signed   By: Paulina Fusi M.D.   On: 08/13/2023 16:04   Dawn Stafford reports that Dawn Stafford has never smoked. Dawn Stafford has never used smokeless tobacco. No results for input(s): "HGBA1C", "LABURIC" in the last 8760  hours.  Objective:  VS:  HT:    WT:   BMI:     BP:   HR: bpm  TEMP: ( )  RESP:  Physical Exam Vitals and nursing note reviewed.  HENT:     Head: Normocephalic and atraumatic.     Right Ear: External ear normal.     Left Ear: External ear normal.     Nose: Nose normal.     Mouth/Throat:     Mouth: Mucous membranes are moist.  Eyes:     Extraocular Movements: Extraocular movements intact.  Cardiovascular:     Rate and Rhythm: Normal rate.     Pulses: Normal pulses.  Pulmonary:     Effort: Pulmonary effort is normal.  Abdominal:     General: Abdomen is flat. There is no distension.  Musculoskeletal:        General: Tenderness present.     Cervical back: Normal range of motion.     Comments: Patient rises from seated position to standing without difficulty. Pain noted with facet loading and lumbar extension. 5/5 strength noted with bilateral hip flexion, knee flexion/extension, ankle dorsiflexion/plantarflexion and EHL. No clonus noted bilaterally. No pain upon palpation of greater trochanters. No pain with internal/external rotation of bilateral hips. Sensation intact bilaterally. Negative slump test bilaterally. Ambulates without aid, gait steady.     Skin:  General: Skin is warm and dry.     Capillary Refill: Capillary refill takes less than 2 seconds.  Neurological:     General: No focal deficit present.     Mental Status: Dawn Stafford is alert and oriented to person, place, and time.  Psychiatric:        Mood and Affect: Mood normal.        Behavior: Behavior normal.     Ortho Exam  Imaging: No results found.  Past Medical/Family/Surgical/Social History: Medications & Allergies reviewed per EMR, new medications updated. Patient Active Problem List   Diagnosis Date Noted   Regurgitation and rechewing    Dysphagia    GERD (gastroesophageal reflux disease) 11/03/2013   HTN (hypertension) 11/03/2013   Hyperlipemia 11/03/2013   Vomiting 11/03/2013   Aerophagia  05/12/2013   Intestinal bacterial overgrowth 04/08/2012   Saliva abnormal 03/11/2012   Past Medical History:  Diagnosis Date   Acid reflux 11/07/2010   GERD (gastroesophageal reflux disease)    Hyperlipidemia    Hypertension 11/07/2010   Rumination    Family History  Problem Relation Age of Onset   Stroke Mother    Kidney disease Sister    Stroke Sister    Diabetes Sister    Diabetes Brother    Kidney failure Brother    Other Maternal Grandmother        poor circulation   Hypertension Maternal Grandmother    Heart disease Maternal Grandmother    Colon cancer Neg Hx    Stomach cancer Neg Hx    Pancreatic cancer Neg Hx    Throat cancer Neg Hx    Past Surgical History:  Procedure Laterality Date   62 HOUR PH STUDY N/A 03/03/2018   Procedure: 24 HOUR PH STUDY (on PPI);  Surgeon: Napoleon Form, MD;  Location: [facility];  Service: Endoscopy;  Laterality: N/A;   BRAVO PH STUDY  01/10/2011   [name]   COLONOSCOPY     04/2013   ESOPHAGEAL MANOMETRY N/A 03/03/2018   Procedure: ESOPHAGEAL MANOMETRY (EM);  Surgeon: Napoleon Form, MD;  Location: [facility];  Service: Endoscopy;  Laterality: N/A;   ESOPHAGOGASTRODUODENOSCOPY  10/2010   PH IMPEDANCE STUDY N/A 03/03/2018   Procedure: PH IMPEDANCE STUDY (on PPI);  Surgeon: Napoleon Form, MD;  Location: [facility];  Service: Endoscopy;  Laterality: N/A;   TUBAL LIGATION     Social History   Occupational History   Not on file  Tobacco Use   Smoking status: Never   Smokeless tobacco: Never  Vaping Use   Vaping status: Never Used  Substance and Sexual Activity   Alcohol use: No    Alcohol/week: 0.0 standard drinks of alcohol   Drug use: Never   Sexual activity: Yes    Partners: Male    Birth control/protection: Post-menopausal

## 2023-08-26 ENCOUNTER — Telehealth: Payer: Self-pay | Admitting: Physical Medicine and Rehabilitation

## 2023-08-26 NOTE — Telephone Encounter (Signed)
Patient returned call needing to schedule an appointment with Dr. Alvester Morin. The number to contact patient is  [phone]

## 2023-08-26 NOTE — Telephone Encounter (Signed)
Patient was seen by [facility] on 08/22/23 and we are waiting for authorization for injection.

## 2023-09-12 ENCOUNTER — Ambulatory Visit: Payer: Medicare HMO | Admitting: Physical Medicine and Rehabilitation

## 2023-09-12 ENCOUNTER — Other Ambulatory Visit: Payer: Self-pay

## 2023-09-12 VITALS — BP 157/73 | HR 79

## 2023-09-12 DIAGNOSIS — M47816 Spondylosis without myelopathy or radiculopathy, lumbar region: Secondary | ICD-10-CM | POA: Diagnosis not present

## 2023-09-12 MED ORDER — METHYLPREDNISOLONE ACETATE 80 MG/ML IJ SUSP
80.0000 mg | Freq: Once | INTRAMUSCULAR | Status: AC
  Administered 2023-09-12: 80 mg

## 2023-09-12 NOTE — Patient Instructions (Signed)

## 2023-09-12 NOTE — Progress Notes (Signed)
Functional Pain Scale - descriptive words and definitions  Distracting (5)    Aware of pain/able to complete some ADL's but limited by pain/sleep is affected and active distractions are only slightly useful. Moderate range order  Average Pain 7   +Driver, -BT, -Dye Allergies  Lower back pain on both sides

## 2023-09-13 NOTE — Procedures (Signed)
Lumbar Facet Joint Intra-Articular Injection(s) with Fluoroscopic Guidance  Patient: Dawn Stafford      Date of Birth: March 19, 1956 MRN: [id number] PCP: Carylon Perches, MD      Visit Date: 09/12/2023   Universal Protocol:    Date/Time: 09/12/2023  Consent Given By: the patient  Position: PRONE   Additional Comments: Vital signs were monitored before and after the procedure. Patient was prepped and draped in the usual sterile fashion. The correct patient, procedure, and site was verified.   Injection Procedure Details:  Procedure Site One Meds Administered:  Meds ordered this encounter  Medications   methylPREDNISolone acetate (DEPO-MEDROL) injection 80 mg     Laterality: Bilateral  Location/Site:  L2-L3 L3-L4  Needle size: 22 guage  Needle type: Spinal  Needle Placement: Articular  Findings:  -Comments: Excellent flow of contrast producing a partial arthrogram.  Procedure Details: The fluoroscope beam is vertically oriented in AP, and the inferior recess is visualized beneath the lower pole of the inferior apophyseal process, which represents the target point for needle insertion. When direct visualization is difficult the target point is located at the medial projection of the vertebral pedicle. The region overlying each aforementioned target is locally anesthetized with a 1 to 2 ml. volume of 1% Lidocaine without Epinephrine.   The spinal needle was inserted into each of the above mentioned facet joints using biplanar fluoroscopic guidance. A 0.25 to 0.5 ml. volume of Isovue-250 was injected and a partial facet joint arthrogram was obtained. A single spot film was obtained of the resulting arthrogram.    One to 1.25 ml of the steroid/anesthetic solution was then injected into each of the facet joints noted above.   Additional Comments:  No complications occurred Dressing: 2 x 2 sterile gauze and Band-Aid    Post-procedure details: Patient was observed during the  procedure. Post-procedure instructions were reviewed.  Patient left the clinic in stable condition.

## 2023-09-13 NOTE — Progress Notes (Signed)
Dawn Stafford - 67 y.o. female MRN [id number]  Date of birth: 1956/06/14  Office Visit Note: Visit Date: 09/12/2023 PCP: Carylon Perches, MD Referred by: Carylon Perches, MD  Subjective: Chief Complaint  Patient presents with   Lower Back - Pain   HPI:  Dawn Stafford is a 67 y.o. female who comes in today at the request of Ellin Goodie, FNP for planned Bilateral  L2-3 and L3-4 Lumbar facet/medial branch block with fluoroscopic guidance.  The patient has failed conservative care including home exercise, medications, time and activity modification.  This injection will be diagnostic and hopefully therapeutic.  Please see requesting physician notes for further details and justification.  Exam has shown concordant pain with facet joint loading.   ROS Otherwise per HPI.  Assessment & Plan: Visit Diagnoses:    ICD-10-CM   1. Spondylosis without myelopathy or radiculopathy, lumbar region  M47.816 XR C-ARM NO REPORT    Facet Injection    methylPREDNISolone acetate (DEPO-MEDROL) injection 80 mg      Plan: No additional findings.   Meds & Orders:  Meds ordered this encounter  Medications   methylPREDNISolone acetate (DEPO-MEDROL) injection 80 mg    Orders Placed This Encounter  Procedures   Facet Injection   XR C-ARM NO REPORT    Follow-up: Return if symptoms worsen or fail to improve.   Procedures: No procedures performed  Lumbar Facet Joint Intra-Articular Injection(s) with Fluoroscopic Guidance  Patient: Dawn Stafford      Date of Birth: [birth year] MRN: [id number] PCP: Carylon Perches, MD      Visit Date: 09/12/2023   Universal Protocol:    Date/Time: 09/12/2023  Consent Given By: the patient  Position: PRONE   Additional Comments: Vital signs were monitored before and after the procedure. Patient was prepped and draped in the usual sterile fashion. The correct patient, procedure, and site was verified.   Injection Procedure Details:  Procedure Site One Meds  Administered:  Meds ordered this encounter  Medications   methylPREDNISolone acetate (DEPO-MEDROL) injection 80 mg     Laterality: Bilateral  Location/Site:  L2-L3 L3-L4  Needle size: 22 guage  Needle type: Spinal  Needle Placement: Articular  Findings:  -Comments: Excellent flow of contrast producing a partial arthrogram.  Procedure Details: The fluoroscope beam is vertically oriented in AP, and the inferior recess is visualized beneath the lower pole of the inferior apophyseal process, which represents the target point for needle insertion. When direct visualization is difficult the target point is located at the medial projection of the vertebral pedicle. The region overlying each aforementioned target is locally anesthetized with a 1 to 2 ml. volume of 1% Lidocaine without Epinephrine.   The spinal needle was inserted into each of the above mentioned facet joints using biplanar fluoroscopic guidance. A 0.25 to 0.5 ml. volume of Isovue-250 was injected and a partial facet joint arthrogram was obtained. A single spot film was obtained of the resulting arthrogram.    One to 1.25 ml of the steroid/anesthetic solution was then injected into each of the facet joints noted above.   Additional Comments:  No complications occurred Dressing: 2 x 2 sterile gauze and Band-Aid    Post-procedure details: Patient was observed during the procedure. Post-procedure instructions were reviewed.  Patient left the clinic in stable condition.    Clinical History: CLINICAL DATA:  Lumbar radiculopathy, symptoms persist with greater than 6 weeks of treatment. Chronic left-sided low back pain radiating down the left leg.  EXAM: MRI LUMBAR SPINE WITHOUT CONTRAST   TECHNIQUE: Multiplanar, multisequence MR imaging of the lumbar spine was performed. No intravenous contrast was administered.   COMPARISON:  Radiography 07/16/2023   FINDINGS: Segmentation:  5 lumbar type vertebral bodies.    Alignment: Mild scoliotic curvature convex to the right. 2 mm degenerative anterolisthesis L3-4, L4-5 and L5-S1.   Vertebrae: No fracture or focal bone lesion. No edematous endplate marrow changes. Some edema associated with the facet joints of the lower lumbar spine, most pronounced at the L2-3 level, which could be symptomatic.   Conus medullaris and cauda equina: Conus extends to the L1 level. Conus and cauda equina appear normal.   Paraspinal and other soft tissues: Negative   Disc levels:   T12-L1 and L1-2: Normal   L2-3: Mild bulging of the disc. Bilateral facet arthropathy with joint effusions and edema. No compressive canal or foraminal narrowing. The facet arthritis could be associated with back pain or referred facet syndrome pain.   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 Ligamentous hypertrophy. Minimal bulging of the disc. No compressive stenosis. The facet arthritis could be painful.   L4-5: Facet osteoarthritis with 2 mm of anterolisthesis. Mild bulging of the disc. No compressive stenosis. The facet arthritis could be symptomatic.   L5-S1: Facet osteoarthritis with 2 mm of anterolisthesis. Mild bulging of the disc. No compressive narrowing of the canal or foramina. The facet arthritis could be painful.   IMPRESSION: 1. 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 This is most pronounced at the L2-3 level where there are joint effusions and edema. No compressive stenosis of the canal or foramina. The facet arthritis could be associated with back pain or referred facet syndrome pain.     Electronically Signed   By: Paulina Fusi M.D.   On: 08/13/2023 16:04     Objective:  VS:  HT:    WT:   BMI:     BP:(!) 157/73  HR:79bpm  TEMP: ( )  RESP:  Physical Exam Vitals and nursing note reviewed.  Constitutional:      General: She is not in acute distress.     Appearance: Normal appearance. She is not ill-appearing.  HENT:     Head: Normocephalic and atraumatic.     Right Ear: External ear normal.     Left Ear: External ear normal.  Eyes:     Extraocular Movements: Extraocular movements intact.  Cardiovascular:     Rate and Rhythm: Normal rate.     Pulses: Normal pulses.  Pulmonary:     Effort: Pulmonary effort is normal. No respiratory distress.  Abdominal:     General: There is no distension.     Palpations: Abdomen is soft.  Musculoskeletal:        General: Tenderness present.     Cervical back: Neck supple.     Right lower leg: No edema.     Left lower leg: No edema.     Comments: Patient has good distal strength with no pain over the greater trochanters.  No clonus or focal weakness.  Skin:    Findings: No erythema, lesion or rash.  Neurological:     General: No focal deficit present.     Mental Status: She is alert and oriented to person, place, and time.     Sensory: No sensory deficit.     Motor: No weakness or abnormal muscle tone.  Coordination: Coordination normal.  Psychiatric:        Mood and Affect: Mood normal.        Behavior: Behavior normal.      Imaging: XR C-ARM NO REPORT  Result Date: 09/12/2023 Please see Notes tab for imaging impression.

## 2023-09-25 ENCOUNTER — Encounter: Payer: Self-pay | Admitting: Orthopaedic Surgery

## 2023-09-25 ENCOUNTER — Ambulatory Visit: Payer: Medicare HMO | Admitting: Orthopaedic Surgery

## 2023-09-25 VITALS — BP 152/74

## 2023-09-25 DIAGNOSIS — M47816 Spondylosis without myelopathy or radiculopathy, lumbar region: Secondary | ICD-10-CM

## 2023-09-25 MED ORDER — HYDROCODONE-ACETAMINOPHEN 5-325 MG PO TABS: ORAL_TABLET | ORAL | 0 refills | Status: DC

## 2023-09-25 NOTE — Progress Notes (Signed)
I feel better.  She has less pain in the lumbar spine.  She had the epidural and it helped.  She is to contact Dr. Alvester Morin about another one.  Lower back is not tender, ROM is full, muscle tone and strength normal, gait normal, NV intact.  Encounter Diagnosis  Name Primary?   Spondylosis without myelopathy or radiculopathy, lumbar region Yes   I will refill her pain medicine to have if she needs it.  I have reviewed the [place] Controlled Substance Reporting System web site prior to prescribing narcotic medicine for this patient.  Return in two months.  Continue her exercises.  Call if any problem.  Precautions discussed.  Electronically Signed Darreld Mclean, MD 10/9/20249:59 AM

## 2023-09-26 ENCOUNTER — Telehealth: Payer: Self-pay | Admitting: Physical Medicine and Rehabilitation

## 2023-09-26 NOTE — Telephone Encounter (Signed)
Patient called and said she to make an appointment for another injection for the lower back. CB#[phone]

## 2023-09-27 DIAGNOSIS — Z23 Encounter for immunization: Secondary | ICD-10-CM | POA: Diagnosis not present

## 2023-09-27 DIAGNOSIS — I10 Essential (primary) hypertension: Secondary | ICD-10-CM | POA: Diagnosis not present

## 2023-09-27 DIAGNOSIS — M47896 Other spondylosis, lumbar region: Secondary | ICD-10-CM | POA: Diagnosis not present

## 2023-09-30 ENCOUNTER — Telehealth: Payer: Self-pay | Admitting: Physical Medicine and Rehabilitation

## 2023-09-30 ENCOUNTER — Other Ambulatory Visit: Payer: Self-pay | Admitting: Physical Medicine and Rehabilitation

## 2023-09-30 DIAGNOSIS — M545 Low back pain, unspecified: Secondary | ICD-10-CM

## 2023-09-30 DIAGNOSIS — M47816 Spondylosis without myelopathy or radiculopathy, lumbar region: Secondary | ICD-10-CM

## 2023-09-30 NOTE — Telephone Encounter (Signed)
I called patient this morning, she reports greater than 80% relief of pain with recent bilateral L2-L3 and L3-L4 facet/medial branch blocks, we will have her come back for second set of injections. I discussed radiofrequency ablation procedure in detail today, she had several questions and was confusing this procedure with spinal cord stimulator placement. Our plan is to move forward with ablation procedure after medial branch blocks.

## 2023-10-10 ENCOUNTER — Ambulatory Visit: Payer: Medicare HMO | Admitting: Physical Medicine and Rehabilitation

## 2023-10-10 ENCOUNTER — Other Ambulatory Visit: Payer: Self-pay

## 2023-10-10 DIAGNOSIS — M47816 Spondylosis without myelopathy or radiculopathy, lumbar region: Secondary | ICD-10-CM | POA: Diagnosis not present

## 2023-10-10 NOTE — Progress Notes (Signed)
Functional Pain Scale - descriptive words and definitions  Unmanageable (7)  Pain interferes with normal ADL's/nothing seems to help/sleep is very difficult/active distractions are very difficult to concentrate on. Severe range order  Average Pain 7 159/72  +Driver, -BT, -Dye Allergies.

## 2023-10-10 NOTE — Patient Instructions (Signed)

## 2023-10-14 ENCOUNTER — Telehealth: Payer: Self-pay

## 2023-10-14 DIAGNOSIS — L851 Acquired keratosis [keratoderma] palmaris et plantaris: Secondary | ICD-10-CM | POA: Diagnosis not present

## 2023-10-14 DIAGNOSIS — M7742 Metatarsalgia, left foot: Secondary | ICD-10-CM | POA: Diagnosis not present

## 2023-10-14 DIAGNOSIS — M79675 Pain in left toe(s): Secondary | ICD-10-CM | POA: Diagnosis not present

## 2023-10-14 DIAGNOSIS — M7741 Metatarsalgia, right foot: Secondary | ICD-10-CM | POA: Diagnosis not present

## 2023-10-14 DIAGNOSIS — L600 Ingrowing nail: Secondary | ICD-10-CM | POA: Diagnosis not present

## 2023-10-14 DIAGNOSIS — M79674 Pain in right toe(s): Secondary | ICD-10-CM | POA: Diagnosis not present

## 2023-10-14 NOTE — Telephone Encounter (Signed)
Patient called wanting to let Dr. Alvester Morin know that she is feeling better.   Patient had injection done on 10/10/2023.  Please advise.  Thank you

## 2023-10-17 ENCOUNTER — Telehealth: Payer: Self-pay

## 2023-10-17 NOTE — Procedures (Signed)
Lumbar Diagnostic Facet Joint Nerve Block with Fluoroscopic Guidance   Patient: Dawn Stafford      Date of Birth: 04/24/1956 MRN: [id number] PCP: Carylon Perches, MD      Visit Date: 10/10/2023   Universal Protocol:    Date/Time: 10/31/245:24 AM  Consent Given By: the patient  Position: PRONE  Additional Comments: Vital signs were monitored before and after the procedure. Patient was prepped and draped in the usual sterile fashion. The correct patient, procedure, and site was verified.   Injection Procedure Details:   Procedure diagnoses:  1. Spondylosis without myelopathy or radiculopathy, lumbar region      Meds Administered: No orders of the defined types were placed in this encounter.    Laterality: Bilateral  Location/Site: L2-L3, L1 and L2 medial branches and L3-L4, L2 and L3 medial branches  Needle: 5.0 in., 25 ga.  Short bevel or Quincke spinal needle  Needle Placement: Oblique pedical  Findings:   -Comments: There was excellent flow of contrast along the articular pillars without intravascular flow.  Procedure Details: The fluoroscope beam is vertically oriented in AP and then obliqued 15 to 20 degrees to the ipsilateral side of the desired nerve to achieve the "Scotty dog" appearance.  The skin over the target area of the junction of the superior articulating process and the transverse process (sacral ala if blocking the L5 dorsal rami) was locally anesthetized with a 1 ml volume of 1% Lidocaine without Epinephrine.  The spinal needle was inserted and advanced in a trajectory view down to the target.   After contact with periosteum and negative aspirate for blood and CSF, correct placement without intravascular or epidural spread was confirmed by injecting 0.5 ml. of Isovue-250.  A spot radiograph was obtained of this image.    Next, a 0.5 ml. volume of the injectate described above was injected. The needle was then redirected to the other facet joint nerves  mentioned above if needed.  Prior to the procedure, the patient was given a Pain Diary which was completed for baseline measurements.  After the procedure, the patient rated their pain every 30 minutes and will continue rating at this frequency for a total of 5 hours.  The patient has been asked to complete the Diary and return to [place] by mail, fax or hand delivered as soon as possible.   Additional Comments:  No complications occurred Dressing: 2 x 2 sterile gauze and Band-Aid    Post-procedure details: Patient was observed during the procedure. Post-procedure instructions were reviewed.  Patient left the clinic in stable condition.

## 2023-10-17 NOTE — Progress Notes (Signed)
Dawn Stafford - 67 y.o. female MRN [id number]  Date of birth: 11-May-1956  Office Visit Note: Visit Date: 10/10/2023 PCP: Carylon Perches, MD Referred by: Carylon Perches, MD  Subjective: Chief Complaint  Patient presents with   Lower Back - Pain   HPI:  Dawn Stafford is a 67 y.o. female who comes in today for planned repeat Bilateral L2-3 and L3-4 Lumbar facet/medial branch block with fluoroscopic guidance.  The patient has failed conservative care including home exercise, medications, time and activity modification.  This injection will be diagnostic and hopefully therapeutic.  Please see requesting physician notes for further details and justification.  Exam shows concordant low back pain with facet joint loading and extension. Patient received more than 80% pain relief from prior injection. This would be the second block in a diagnostic double block paradigm.     Referring:Megan Mayford Knife, FNP   ROS Otherwise per HPI.  Assessment & Plan: Visit Diagnoses:    ICD-10-CM   1. Spondylosis without myelopathy or radiculopathy, lumbar region  M47.816 XR C-ARM NO REPORT    Facet Injection      Plan: No additional findings.   Meds & Orders: No orders of the defined types were placed in this encounter.   Orders Placed This Encounter  Procedures   Facet Injection   XR C-ARM NO REPORT    Follow-up: Return for Review Pain Diary.   Procedures: No procedures performed  Lumbar Diagnostic Facet Joint Nerve Block with Fluoroscopic Guidance   Patient: Dawn Stafford      Date of Birth: 01-22-1956 MRN: [id number] PCP: Carylon Perches, MD      Visit Date: 10/10/2023   Universal Protocol:    Date/Time: 10/31/245:24 AM  Consent Given By: the patient  Position: PRONE  Additional Comments: Vital signs were monitored before and after the procedure. Patient was prepped and draped in the usual sterile fashion. The correct patient, procedure, and site was verified.   Injection Procedure  Details:   Procedure diagnoses:  1. Spondylosis without myelopathy or radiculopathy, lumbar region      Meds Administered: No orders of the defined types were placed in this encounter.    Laterality: Bilateral  Location/Site: L2-L3, L1 and L2 medial branches and L3-L4, L2 and L3 medial branches  Needle: 5.0 in., 25 ga.  Short bevel or Quincke spinal needle  Needle Placement: Oblique pedical  Findings:   -Comments: There was excellent flow of contrast along the articular pillars without intravascular flow.  Procedure Details: The fluoroscope beam is vertically oriented in AP and then obliqued 15 to 20 degrees to the ipsilateral side of the desired nerve to achieve the "Scotty dog" appearance.  The skin over the target area of the junction of the superior articulating process and the transverse process (sacral ala if blocking the L5 dorsal rami) was locally anesthetized with a 1 ml volume of 1% Lidocaine without Epinephrine.  The spinal needle was inserted and advanced in a trajectory view down to the target.   After contact with periosteum and negative aspirate for blood and CSF, correct placement without intravascular or epidural spread was confirmed by injecting 0.5 ml. of Isovue-250.  A spot radiograph was obtained of this image.    Next, a 0.5 ml. volume of the injectate described above was injected. The needle was then redirected to the other facet joint nerves mentioned above if needed.  Prior to the procedure, the patient was given a Pain Diary which was completed for baseline  measurements.  After the procedure, the patient rated their pain every 30 minutes and will continue rating at this frequency for a total of 5 hours.  The patient has been asked to complete the Diary and return to [place] by mail, fax or hand delivered as soon as possible.   Additional Comments:  No complications occurred Dressing: 2 x 2 sterile gauze and Band-Aid    Post-procedure details: Patient was  observed during the procedure. Post-procedure instructions were reviewed.  Patient left the clinic in stable condition.   Clinical History: CLINICAL DATA:  Lumbar radiculopathy, symptoms persist with greater than 6 weeks of treatment. Chronic left-sided low back pain radiating down the left leg.   EXAM: MRI LUMBAR SPINE WITHOUT CONTRAST   TECHNIQUE: Multiplanar, multisequence MR imaging of the lumbar spine was performed. No intravenous contrast was administered.   COMPARISON:  Radiography 07/16/2023   FINDINGS: Segmentation:  5 lumbar type vertebral bodies.   Alignment: Mild scoliotic curvature convex to the right. 2 mm degenerative anterolisthesis L3-4, L4-5 and L5-S1.   Vertebrae: No fracture or focal bone lesion. No edematous endplate marrow changes. Some edema associated with the facet joints of the lower lumbar spine, most pronounced at the L2-3 level, which could be symptomatic.   Conus medullaris and cauda equina: Conus extends to the L1 level. Conus and cauda equina appear normal.   Paraspinal and other soft tissues: Negative   Disc levels:   T12-L1 and L1-2: Normal   L2-3: Mild bulging of the disc. Bilateral facet arthropathy with joint effusions and edema. No compressive canal or foraminal narrowing. The facet arthritis could be associated with back pain or referred facet syndrome pain.   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 Ligamentous hypertrophy. Minimal bulging of the disc. No compressive stenosis. The facet arthritis could be painful.   L4-5: Facet osteoarthritis with 2 mm of anterolisthesis. Mild bulging of the disc. No compressive stenosis. The facet arthritis could be symptomatic.   L5-S1: Facet osteoarthritis with 2 mm of anterolisthesis. Mild bulging of the disc. No compressive narrowing of the canal or foramina. The facet arthritis could be painful.   IMPRESSION: 1. The dominant finding in this case is that of facet  osteoarthritis from L2-3 through L5-S1 which could be painful. This is most pronounced at the L2-3 level where there are joint effusions and edema. No compressive stenosis of the canal or foramina. The facet arthritis could be associated with back pain or referred facet syndrome pain.     Electronically Signed   By: Paulina Fusi M.D.   On: 08/13/2023 16:04     Objective:  VS:  HT:    WT:   BMI:     BP:   HR: bpm  TEMP: ( )  RESP:  Physical Exam Vitals and nursing note reviewed.  Constitutional:      General: She is not in acute distress.    Appearance: Normal appearance. She is not ill-appearing.  HENT:     Head: Normocephalic and atraumatic.     Right Ear: External ear normal.     Left Ear: External ear normal.  Eyes:     Extraocular Movements: Extraocular movements intact.  Cardiovascular:     Rate and Rhythm: Normal rate.     Pulses: Normal pulses.  Pulmonary:     Effort: Pulmonary effort is normal. No respiratory distress.  Abdominal:     General: There is no distension.     Palpations: Abdomen is soft.  Musculoskeletal:  General: Tenderness present.     Cervical back: Neck supple.     Right lower leg: No edema.     Left lower leg: No edema.     Comments: Patient has good distal strength with no pain over the greater trochanters.  No clonus or focal weakness.  Skin:    Findings: No erythema, lesion or rash.  Neurological:     General: No focal deficit present.     Mental Status: She is alert and oriented to person, place, and time.     Sensory: No sensory deficit.     Motor: No weakness or abnormal muscle tone.     Coordination: Coordination normal.  Psychiatric:        Mood and Affect: Mood normal.        Behavior: Behavior normal.      Imaging: No results found.

## 2023-10-17 NOTE — Telephone Encounter (Signed)
Pt called and states that she is feeling" a lot better"  since her injection and if you need to speak with her further you can cb [phone]

## 2023-10-28 ENCOUNTER — Telehealth: Payer: Self-pay

## 2023-10-28 NOTE — Telephone Encounter (Signed)
Patient would like a call back concerning a procedure to be done.  CB# [phone].  Please advise.  Thank you.

## 2023-10-31 ENCOUNTER — Telehealth: Payer: Self-pay

## 2023-10-31 NOTE — Telephone Encounter (Signed)
Pt returning call to Ladona Ridgel [phone] and trying to sch an appt with FN states that she has transportation arranged for next Thursday if she can be seen then.

## 2023-11-01 ENCOUNTER — Other Ambulatory Visit: Payer: Self-pay | Admitting: Physical Medicine and Rehabilitation

## 2023-11-01 DIAGNOSIS — G8929 Other chronic pain: Secondary | ICD-10-CM

## 2023-11-01 DIAGNOSIS — M47816 Spondylosis without myelopathy or radiculopathy, lumbar region: Secondary | ICD-10-CM

## 2023-11-01 MED ORDER — DIAZEPAM 5 MG PO TABS: ORAL_TABLET | ORAL | 0 refills | Status: AC

## 2023-11-19 ENCOUNTER — Encounter: Payer: Self-pay | Admitting: Physical Medicine and Rehabilitation

## 2023-11-19 ENCOUNTER — Other Ambulatory Visit: Payer: Self-pay

## 2023-11-19 ENCOUNTER — Ambulatory Visit: Payer: Medicare HMO | Admitting: Physical Medicine and Rehabilitation

## 2023-11-19 VITALS — BP 128/77 | HR 89

## 2023-11-19 DIAGNOSIS — M47816 Spondylosis without myelopathy or radiculopathy, lumbar region: Secondary | ICD-10-CM | POA: Diagnosis not present

## 2023-11-19 MED ORDER — METHYLPREDNISOLONE ACETATE 40 MG/ML IJ SUSP
40.0000 mg | Freq: Once | INTRAMUSCULAR | Status: AC
  Administered 2023-11-19: 40 mg

## 2023-11-19 NOTE — Progress Notes (Signed)
Functional Pain Scale - descriptive words and definitions  Unmanageable (7)  Pain interferes with normal ADL's/nothing seems to help/sleep is very difficult/active distractions are very difficult to concentrate on. Severe range order  Average Pain 7  Patient states back is better than before, but still has back pain that radiates down into the left leg.  No numbness or tingling.  Takes Hydrocodone.     +Driver, -BT, -Dye Allergies.

## 2023-11-19 NOTE — Patient Instructions (Signed)

## 2023-11-25 ENCOUNTER — Ambulatory Visit: Payer: Medicare HMO | Admitting: Orthopedic Surgery

## 2023-11-26 ENCOUNTER — Ambulatory Visit: Payer: Medicare HMO | Admitting: Orthopaedic Surgery

## 2023-11-27 ENCOUNTER — Encounter: Payer: Self-pay | Admitting: Orthopaedic Surgery

## 2023-11-27 ENCOUNTER — Ambulatory Visit: Payer: Medicare HMO | Admitting: Orthopaedic Surgery

## 2023-11-27 VITALS — BP 146/77 | HR 76 | Ht 66.0 in | Wt 128.0 lb

## 2023-11-27 DIAGNOSIS — M47816 Spondylosis without myelopathy or radiculopathy, lumbar region: Secondary | ICD-10-CM

## 2023-11-27 NOTE — Progress Notes (Signed)
I am better.  She has lower back pain.  She is scheduled for an epidural tomorrow in the lower back. She has good and bad days but overall she is better.  She has no new trauma, no weakness.  NV intact. ROM of back is good.  Muscle tone and strength is normal.  Gait is normal.  Encounter Diagnosis  Name Primary?   Facet arthropathy, lumbar Yes   I will see as needed.  Call if any problem.  Precautions discussed.  Electronically Signed Darreld Mclean, MD 12/11/202410:00 AM

## 2023-11-28 ENCOUNTER — Other Ambulatory Visit: Payer: Self-pay

## 2023-11-28 ENCOUNTER — Ambulatory Visit: Payer: Medicare HMO | Admitting: Physical Medicine and Rehabilitation

## 2023-11-28 DIAGNOSIS — M5416 Radiculopathy, lumbar region: Secondary | ICD-10-CM

## 2023-11-28 MED ORDER — METHYLPREDNISOLONE ACETATE 40 MG/ML IJ SUSP
40.0000 mg | Freq: Once | INTRAMUSCULAR | Status: AC
  Administered 2023-11-28: 40 mg

## 2023-11-28 NOTE — Patient Instructions (Signed)

## 2023-11-28 NOTE — Procedures (Signed)
Lumbar Facet Joint Nerve Denervation  Patient: Dawn Stafford      Date of Birth: 01-Dec-1956 MRN: [id number] PCP: Carylon Perches, MD      Visit Date: 11/28/2023   Universal Protocol:    Date/Time: 12/12/243:55 PM  Consent Given By: the patient  Position: PRONE  Additional Comments: Vital signs were monitored before and after the procedure. Patient was prepped and draped in the usual sterile fashion. The correct patient, procedure, and site was verified.   Injection Procedure Details:   Procedure diagnoses:  1. Lumbar radiculopathy      Meds Administered:  Meds ordered this encounter  Medications   methylPREDNISolone acetate (DEPO-MEDROL) injection 40 mg     Laterality: Right  Location/Site:  L2-L3, L1 and L2 medial branches and L3-L4, L2 and L3 medial branches  Needle: 18 ga.,  10mm active tip, RF Cannula  Needle Placement: Along juncture of superior articular process and transverse pocess  Findings:  -Comments:  Procedure Details: For each desired target nerve, the corresponding transverse process (sacral ala for the L5 dorsal rami) was identified and the fluoroscope was positioned to square off the endplates of the corresponding vertebral body to achieve a true AP midline view.  The beam was then obliqued 15 to 20 degrees and caudally tilted 15 to 20 degrees to line up a trajectory along the target nerves. The skin over the target of the junction of superior articulating process and transverse process (sacral ala for the L5 dorsal rami) was infiltrated with 1ml of 1% Lidocaine without Epinephrine.  The 18 gauge 10mm active tip outer cannula was advanced in trajectory view to the target.  This procedure was repeated for each target nerve.  Then, for all levels, the outer cannula placement was fine-tuned and the position was then confirmed with bi-planar imaging.    Test stimulation was done both at sensory and motor levels to ensure there was no radicular  stimulation. The target tissues were then infiltrated with 1 ml of 1% Lidocaine without Epinephrine. Subsequently, a percutaneous neurotomy was carried out for 90 seconds at 80 degrees Celsius.  After the completion of the lesion, 1 ml of injectate was delivered. It was then repeated for each facet joint nerve mentioned above. Appropriate radiographs were obtained to verify the probe placement during the neurotomy.   Additional Comments:  No complications occurred Dressing: 2 x 2 sterile gauze and Band-Aid    Post-procedure details: Patient was observed during the procedure. Post-procedure instructions were reviewed.  Patient left the clinic in stable condition.

## 2023-11-28 NOTE — Progress Notes (Signed)
Dawn Stafford - 67 y.o. female MRN [id number]  Date of birth: 08-22-1956  Office Visit Note: Visit Date: 11/28/2023 PCP: Carylon Perches, MD Referred by: Carylon Perches, MD  Subjective: No chief complaint on file.  HPI:  Dawn Stafford is a 67 y.o. female who comes in todayfor planned radiofrequency ablation of the Right L2-3 and L3-4 Lumbar facet joints. This would be ablation of the corresponding medial branches and/or dorsal rami.  Patient has had double diagnostic blocks with more than 50% relief.  These are documented on pain diary.  They have had chronic back pain for quite some time, more than 3 months, which has been an ongoing situation with recalcitrant axial back pain.  They have no radicular pain.  Their axial pain is worse with standing and ambulating and on exam today with facet loading.  They have had physical therapy as well as home exercise program.  The imaging noted in the chart below indicated facet pathology. Accordingly they meet all the criteria and qualification for for radiofrequency ablation and we are going to complete this today hopefully for more longer term relief as part of comprehensive management program.    ROS Otherwise per HPI.  Assessment & Plan: Visit Diagnoses:    ICD-10-CM   1. Lumbar radiculopathy  M54.16 methylPREDNISolone acetate (DEPO-MEDROL) injection 40 mg    XR C-ARM NO REPORT    Radiofrequency,Lumbar      Plan: No additional findings.   Meds & Orders:  Meds ordered this encounter  Medications   methylPREDNISolone acetate (DEPO-MEDROL) injection 40 mg    Orders Placed This Encounter  Procedures   Radiofrequency,Lumbar   XR C-ARM NO REPORT    Follow-up: Return if symptoms worsen or fail to improve.   Procedures: No procedures performed  Lumbar Facet Joint Nerve Denervation  Patient: Dawn Stafford      Date of Birth: 11-Sep-1956 MRN: [id number] PCP: Carylon Perches, MD      Visit Date: 11/28/2023   Universal Protocol:    Date/Time:  12/12/243:55 PM  Consent Given By: the patient  Position: PRONE  Additional Comments: Vital signs were monitored before and after the procedure. Patient was prepped and draped in the usual sterile fashion. The correct patient, procedure, and site was verified.   Injection Procedure Details:   Procedure diagnoses:  1. Lumbar radiculopathy      Meds Administered:  Meds ordered this encounter  Medications   methylPREDNISolone acetate (DEPO-MEDROL) injection 40 mg     Laterality: Right  Location/Site:  L2-L3, L1 and L2 medial branches and L3-L4, L2 and L3 medial branches  Needle: 18 ga.,  10mm active tip, RF Cannula  Needle Placement: Along juncture of superior articular process and transverse pocess  Findings:  -Comments:  Procedure Details: For each desired target nerve, the corresponding transverse process (sacral ala for the L5 dorsal rami) was identified and the fluoroscope was positioned to square off the endplates of the corresponding vertebral body to achieve a true AP midline view.  The beam was then obliqued 15 to 20 degrees and caudally tilted 15 to 20 degrees to line up a trajectory along the target nerves. The skin over the target of the junction of superior articulating process and transverse process (sacral ala for the L5 dorsal rami) was infiltrated with 1ml of 1% Lidocaine without Epinephrine.  The 18 gauge 10mm active tip outer cannula was advanced in trajectory view to the target.  This procedure was repeated for each target nerve.  Then, for all levels, the outer cannula placement was fine-tuned and the position was then confirmed with bi-planar imaging.    Test stimulation was done both at sensory and motor levels to ensure there was no radicular stimulation. The target tissues were then infiltrated with 1 ml of 1% Lidocaine without Epinephrine. Subsequently, a percutaneous neurotomy was carried out for 90 seconds at 80 degrees Celsius.  After the  completion of the lesion, 1 ml of injectate was delivered. It was then repeated for each facet joint nerve mentioned above. Appropriate radiographs were obtained to verify the probe placement during the neurotomy.   Additional Comments:  No complications occurred Dressing: 2 x 2 sterile gauze and Band-Aid    Post-procedure details: Patient was observed during the procedure. Post-procedure instructions were reviewed.  Patient left the clinic in stable condition.      Clinical History: CLINICAL DATA:  Lumbar radiculopathy, symptoms persist with greater than 6 weeks of treatment. Chronic left-sided low back pain radiating down the left leg.   EXAM: MRI LUMBAR SPINE WITHOUT CONTRAST   TECHNIQUE: Multiplanar, multisequence MR imaging of the lumbar spine was performed. No intravenous contrast was administered.   COMPARISON:  Radiography 07/16/2023   FINDINGS: Segmentation:  5 lumbar type vertebral bodies.   Alignment: Mild scoliotic curvature convex to the right. 2 mm degenerative anterolisthesis L3-4, L4-5 and L5-S1.   Vertebrae: No fracture or focal bone lesion. No edematous endplate marrow changes. Some edema associated with the facet joints of the lower lumbar spine, most pronounced at the L2-3 level, which could be symptomatic.   Conus medullaris and cauda equina: Conus extends to the L1 level. Conus and cauda equina appear normal.   Paraspinal and other soft tissues: Negative   Disc levels:   T12-L1 and L1-2: Normal   L2-3: Mild bulging of the disc. Bilateral facet arthropathy with joint effusions and edema. No compressive canal or foraminal narrowing. The facet arthritis could be associated with back pain or referred facet syndrome pain.   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 Ligamentous hypertrophy. Minimal bulging of the disc. No compressive stenosis. The facet arthritis could be painful.   L4-5: Facet osteoarthritis with 2 mm of  anterolisthesis. Mild bulging of the disc. No compressive stenosis. The facet arthritis could be symptomatic.   L5-S1: Facet osteoarthritis with 2 mm of anterolisthesis. Mild bulging of the disc. No compressive narrowing of the canal or foramina. The facet arthritis could be painful.   IMPRESSION: 1. 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 This is most pronounced at the L2-3 level where there are joint effusions and edema. No compressive stenosis of the canal or foramina. The facet arthritis could be associated with back pain or referred facet syndrome pain.     Electronically Signed   By: Paulina Fusi M.D.   On: 08/13/2023 16:04     Objective:  VS:  HT:    WT:   BMI:     BP:   HR: bpm  TEMP: ( )  RESP:  Physical Exam Vitals and nursing note reviewed.  Constitutional:      General: She is not in acute distress.    Appearance: Normal appearance. She is not ill-appearing.  HENT:     Head: Normocephalic and atraumatic.     Right Ear: External ear normal.     Left Ear: External ear normal.  Eyes:     Extraocular Movements:  Extraocular movements intact.  Cardiovascular:     Rate and Rhythm: Normal rate.     Pulses: Normal pulses.  Pulmonary:     Effort: Pulmonary effort is normal. No respiratory distress.  Abdominal:     General: There is no distension.     Palpations: Abdomen is soft.  Musculoskeletal:        General: Tenderness present.     Cervical back: Neck supple.     Right lower leg: No edema.     Left lower leg: No edema.     Comments: Patient has good distal strength with no pain over the greater trochanters.  No clonus or focal weakness.  Skin:    Findings: No erythema, lesion or rash.  Neurological:     General: No focal deficit present.     Mental Status: She is alert and oriented to person, place, and time.     Sensory: No sensory deficit.     Motor: No weakness or abnormal  muscle tone.     Coordination: Coordination normal.  Psychiatric:        Mood and Affect: Mood normal.        Behavior: Behavior normal.      Imaging: No results found.

## 2023-11-28 NOTE — Procedures (Signed)
Lumbar Facet Joint Nerve Denervation  Patient: Dawn Stafford      Date of Birth: 07-17-1956 MRN: [id number] PCP: Carylon Perches, MD      Visit Date: 11/19/2023   Universal Protocol:    Date/Time: 12/12/243:51 PM  Consent Given By: the patient  Position: PRONE  Additional Comments: Vital signs were monitored before and after the procedure. Patient was prepped and draped in the usual sterile fashion. The correct patient, procedure, and site was verified.   Injection Procedure Details:   Procedure diagnoses:  1. Spondylosis without myelopathy or radiculopathy, lumbar region      Meds Administered:  Meds ordered this encounter  Medications   methylPREDNISolone acetate (DEPO-MEDROL) injection 40 mg     Laterality: Left  Location/Site:  L2-L3, L1 and L2 medial branches and L3-L4, L2 and L3 medial branches  Needle: 18 ga.,  10mm active tip, RF Cannula  Needle Placement: Along juncture of superior articular process and transverse pocess  Findings:  -Comments:  Procedure Details: For each desired target nerve, the corresponding transverse process (sacral ala for the L5 dorsal rami) was identified and the fluoroscope was positioned to square off the endplates of the corresponding vertebral body to achieve a true AP midline view.  The beam was then obliqued 15 to 20 degrees and caudally tilted 15 to 20 degrees to line up a trajectory along the target nerves. The skin over the target of the junction of superior articulating process and transverse process (sacral ala for the L5 dorsal rami) was infiltrated with 1ml of 1% Lidocaine without Epinephrine.  The 18 gauge 10mm active tip outer cannula was advanced in trajectory view to the target.  This procedure was repeated for each target nerve.  Then, for all levels, the outer cannula placement was fine-tuned and the position was then confirmed with bi-planar imaging.    Test stimulation was done both at sensory and motor levels to  ensure there was no radicular stimulation. The target tissues were then infiltrated with 1 ml of 1% Lidocaine without Epinephrine. Subsequently, a percutaneous neurotomy was carried out for 90 seconds at 80 degrees Celsius.  After the completion of the lesion, 1 ml of injectate was delivered. It was then repeated for each facet joint nerve mentioned above. Appropriate radiographs were obtained to verify the probe placement during the neurotomy.   Additional Comments:  No complications occurred Dressing: 2 x 2 sterile gauze and Band-Aid    Post-procedure details: Patient was observed during the procedure. Post-procedure instructions were reviewed.  Patient left the clinic in stable condition.

## 2023-11-28 NOTE — Progress Notes (Signed)
Dawn Stafford - 67 y.o. female MRN [id number]  Date of birth: June 17, 1956  Office Visit Note: Visit Date: 11/19/2023 PCP: Carylon Perches, MD Referred by: Carylon Perches, MD  Subjective: Chief Complaint  Patient presents with   Lower Back - Pain   HPI:  Dawn Stafford is a 67 y.o. female who comes in todayfor planned radiofrequency ablation of the Left L2-3 and L3-4 Lumbar facet joints. This would be ablation of the corresponding medial branches and/or dorsal rami.  Patient has had double diagnostic blocks with more than 50% relief.  These are documented on pain diary.  They have had chronic back pain for quite some time, more than 3 months, which has been an ongoing situation with recalcitrant axial back pain.  They have no radicular pain.  Their axial pain is worse with standing and ambulating and on exam today with facet loading.  They have had physical therapy as well as home exercise program.  The imaging noted in the chart below indicated facet pathology. Accordingly they meet all the criteria and qualification for for radiofrequency ablation and we are going to complete this today hopefully for more longer term relief as part of comprehensive management program.   ROS Otherwise per HPI.  Assessment & Plan: Visit Diagnoses:    ICD-10-CM   1. Spondylosis without myelopathy or radiculopathy, lumbar region  M47.816 XR C-ARM NO REPORT    Radiofrequency,Lumbar    methylPREDNISolone acetate (DEPO-MEDROL) injection 40 mg      Plan: No additional findings.   Meds & Orders:  Meds ordered this encounter  Medications   methylPREDNISolone acetate (DEPO-MEDROL) injection 40 mg    Orders Placed This Encounter  Procedures   Radiofrequency,Lumbar   XR C-ARM NO REPORT    Follow-up: Return for visit to requesting provider as needed.   Procedures: No procedures performed  Lumbar Facet Joint Nerve Denervation  Patient: Dawn Stafford      Date of Birth: 07-19-1956 MRN: [id number] PCP: Carylon Perches, MD      Visit Date: 11/19/2023   Universal Protocol:    Date/Time: 12/12/243:51 PM  Consent Given By: the patient  Position: PRONE  Additional Comments: Vital signs were monitored before and after the procedure. Patient was prepped and draped in the usual sterile fashion. The correct patient, procedure, and site was verified.   Injection Procedure Details:   Procedure diagnoses:  1. Spondylosis without myelopathy or radiculopathy, lumbar region      Meds Administered:  Meds ordered this encounter  Medications   methylPREDNISolone acetate (DEPO-MEDROL) injection 40 mg     Laterality: Left  Location/Site:  L2-L3, L1 and L2 medial branches and L3-L4, L2 and L3 medial branches  Needle: 18 ga.,  10mm active tip, RF Cannula  Needle Placement: Along juncture of superior articular process and transverse pocess  Findings:  -Comments:  Procedure Details: For each desired target nerve, the corresponding transverse process (sacral ala for the L5 dorsal rami) was identified and the fluoroscope was positioned to square off the endplates of the corresponding vertebral body to achieve a true AP midline view.  The beam was then obliqued 15 to 20 degrees and caudally tilted 15 to 20 degrees to line up a trajectory along the target nerves. The skin over the target of the junction of superior articulating process and transverse process (sacral ala for the L5 dorsal rami) was infiltrated with 1ml of 1% Lidocaine without Epinephrine.  The 18 gauge 10mm active tip outer cannula  was advanced in trajectory view to the target.  This procedure was repeated for each target nerve.  Then, for all levels, the outer cannula placement was fine-tuned and the position was then confirmed with bi-planar imaging.    Test stimulation was done both at sensory and motor levels to ensure there was no radicular stimulation. The target tissues were then infiltrated with 1 ml of 1% Lidocaine without  Epinephrine. Subsequently, a percutaneous neurotomy was carried out for 90 seconds at 80 degrees Celsius.  After the completion of the lesion, 1 ml of injectate was delivered. It was then repeated for each facet joint nerve mentioned above. Appropriate radiographs were obtained to verify the probe placement during the neurotomy.   Additional Comments:  No complications occurred Dressing: 2 x 2 sterile gauze and Band-Aid    Post-procedure details: Patient was observed during the procedure. Post-procedure instructions were reviewed.  Patient left the clinic in stable condition.      Clinical History: CLINICAL DATA:  Lumbar radiculopathy, symptoms persist with greater than 6 weeks of treatment. Chronic left-sided low back pain radiating down the left leg.   EXAM: MRI LUMBAR SPINE WITHOUT CONTRAST   TECHNIQUE: Multiplanar, multisequence MR imaging of the lumbar spine was performed. No intravenous contrast was administered.   COMPARISON:  Radiography 07/16/2023   FINDINGS: Segmentation:  5 lumbar type vertebral bodies.   Alignment: Mild scoliotic curvature convex to the right. 2 mm degenerative anterolisthesis L3-4, L4-5 and L5-S1.   Vertebrae: No fracture or focal bone lesion. No edematous endplate marrow changes. Some edema associated with the facet joints of the lower lumbar spine, most pronounced at the L2-3 level, which could be symptomatic.   Conus medullaris and cauda equina: Conus extends to the L1 level. Conus and cauda equina appear normal.   Paraspinal and other soft tissues: Negative   Disc levels:   T12-L1 and L1-2: Normal   L2-3: Mild bulging of the disc. Bilateral facet arthropathy with joint effusions and edema. No compressive canal or foraminal narrowing. The facet arthritis could be associated with back pain or referred facet syndrome pain.   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 Ligamentous hypertrophy. Minimal bulging of the  disc. No compressive stenosis. The facet arthritis could be painful.   L4-5: Facet osteoarthritis with 2 mm of anterolisthesis. Mild bulging of the disc. No compressive stenosis. The facet arthritis could be symptomatic.   L5-S1: Facet osteoarthritis with 2 mm of anterolisthesis. Mild bulging of the disc. No compressive narrowing of the canal or foramina. The facet arthritis could be painful.   IMPRESSION: 1. 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 This is most pronounced at the L2-3 level where there are joint effusions and edema. No compressive stenosis of the canal or foramina. The facet arthritis could be associated with back pain or referred facet syndrome pain.     Electronically Signed   By: Paulina Fusi M.D.   On: 08/13/2023 16:04     Objective:  VS:  HT:    WT:   BMI:     BP:128/77  HR:89bpm  TEMP: ( )  RESP:  Physical Exam Vitals and nursing note reviewed.  Constitutional:      General: She is not in acute distress.    Appearance: Normal appearance. She is not ill-appearing.  HENT:     Head: Normocephalic and atraumatic.     Right Ear: External ear normal.  Left Ear: External ear normal.  Eyes:     Extraocular Movements: Extraocular movements intact.  Cardiovascular:     Rate and Rhythm: Normal rate.     Pulses: Normal pulses.  Pulmonary:     Effort: Pulmonary effort is normal. No respiratory distress.  Abdominal:     General: There is no distension.     Palpations: Abdomen is soft.  Musculoskeletal:        General: Tenderness present.     Cervical back: Neck supple.     Right lower leg: No edema.     Left lower leg: No edema.     Comments: Patient has good distal strength with no pain over the greater trochanters.  No clonus or focal weakness.  Skin:    Findings: No erythema, lesion or rash.  Neurological:     General: No focal deficit present.     Mental Status: She  is alert and oriented to person, place, and time.     Sensory: No sensory deficit.     Motor: No weakness or abnormal muscle tone.     Coordination: Coordination normal.  Psychiatric:        Mood and Affect: Mood normal.        Behavior: Behavior normal.      Imaging: No results found.

## 2023-11-29 DIAGNOSIS — I10 Essential (primary) hypertension: Secondary | ICD-10-CM | POA: Diagnosis not present

## 2023-11-29 DIAGNOSIS — K219 Gastro-esophageal reflux disease without esophagitis: Secondary | ICD-10-CM | POA: Diagnosis not present

## 2023-12-16 ENCOUNTER — Telehealth: Payer: Self-pay | Admitting: Orthopaedic Surgery

## 2023-12-16 NOTE — Telephone Encounter (Signed)
Dr. Sanjuan Dame pt - pt lvm requesting a refill on Hydrocodone 5-325, 30 tablets, One tablet every four hours for pain to be sent to [facility]

## 2023-12-19 MED ORDER — HYDROCODONE-ACETAMINOPHEN 5-325 MG PO TABS: ORAL_TABLET | ORAL | 0 refills | Status: AC

## 2024-01-09 ENCOUNTER — Other Ambulatory Visit (HOSPITAL_COMMUNITY): Payer: Self-pay | Admitting: Internal Medicine

## 2024-01-09 DIAGNOSIS — Z1231 Encounter for screening mammogram for malignant neoplasm of breast: Secondary | ICD-10-CM

## 2024-01-13 DIAGNOSIS — L851 Acquired keratosis [keratoderma] palmaris et plantaris: Secondary | ICD-10-CM | POA: Diagnosis not present

## 2024-01-13 DIAGNOSIS — M7741 Metatarsalgia, right foot: Secondary | ICD-10-CM | POA: Diagnosis not present

## 2024-01-13 DIAGNOSIS — M79674 Pain in right toe(s): Secondary | ICD-10-CM | POA: Diagnosis not present

## 2024-01-13 DIAGNOSIS — B351 Tinea unguium: Secondary | ICD-10-CM | POA: Diagnosis not present

## 2024-01-13 DIAGNOSIS — M79675 Pain in left toe(s): Secondary | ICD-10-CM | POA: Diagnosis not present

## 2024-01-16 ENCOUNTER — Ambulatory Visit (HOSPITAL_COMMUNITY)
Admission: RE | Admit: 2024-01-16 | Discharge: 2024-01-16 | Disposition: A | Discharge status: Home / Self Care | Payer: Medicare HMO | Source: Ambulatory Visit | Attending: Internal Medicine | Admitting: Internal Medicine

## 2024-01-16 DIAGNOSIS — Z1231 Encounter for screening mammogram for malignant neoplasm of breast: Secondary | ICD-10-CM | POA: Diagnosis not present

## 2024-04-13 DIAGNOSIS — M7741 Metatarsalgia, right foot: Secondary | ICD-10-CM | POA: Diagnosis not present

## 2024-04-13 DIAGNOSIS — M79674 Pain in right toe(s): Secondary | ICD-10-CM | POA: Diagnosis not present

## 2024-04-13 DIAGNOSIS — M79675 Pain in left toe(s): Secondary | ICD-10-CM | POA: Diagnosis not present

## 2024-04-13 DIAGNOSIS — M7742 Metatarsalgia, left foot: Secondary | ICD-10-CM | POA: Diagnosis not present

## 2024-04-13 DIAGNOSIS — L851 Acquired keratosis [keratoderma] palmaris et plantaris: Secondary | ICD-10-CM | POA: Diagnosis not present

## 2024-05-19 DIAGNOSIS — K219 Gastro-esophageal reflux disease without esophagitis: Secondary | ICD-10-CM | POA: Diagnosis not present

## 2024-05-19 DIAGNOSIS — Z79899 Other long term (current) drug therapy: Secondary | ICD-10-CM | POA: Diagnosis not present

## 2024-05-19 DIAGNOSIS — I10 Essential (primary) hypertension: Secondary | ICD-10-CM | POA: Diagnosis not present

## 2024-05-26 DIAGNOSIS — K219 Gastro-esophageal reflux disease without esophagitis: Secondary | ICD-10-CM | POA: Diagnosis not present

## 2024-05-26 DIAGNOSIS — I10 Essential (primary) hypertension: Secondary | ICD-10-CM | POA: Diagnosis not present

## 2024-05-26 DIAGNOSIS — E785 Hyperlipidemia, unspecified: Secondary | ICD-10-CM | POA: Diagnosis not present

## 2024-06-11 DIAGNOSIS — H25813 Combined forms of age-related cataract, bilateral: Secondary | ICD-10-CM | POA: Diagnosis not present

## 2024-06-22 DIAGNOSIS — L851 Acquired keratosis [keratoderma] palmaris et plantaris: Secondary | ICD-10-CM | POA: Diagnosis not present

## 2024-06-22 DIAGNOSIS — M7742 Metatarsalgia, left foot: Secondary | ICD-10-CM | POA: Diagnosis not present

## 2024-06-22 DIAGNOSIS — B351 Tinea unguium: Secondary | ICD-10-CM | POA: Diagnosis not present

## 2024-06-22 DIAGNOSIS — L600 Ingrowing nail: Secondary | ICD-10-CM | POA: Diagnosis not present

## 2024-06-22 DIAGNOSIS — M79672 Pain in left foot: Secondary | ICD-10-CM | POA: Diagnosis not present

## 2024-06-22 DIAGNOSIS — M79671 Pain in right foot: Secondary | ICD-10-CM | POA: Diagnosis not present

## 2024-06-22 DIAGNOSIS — M7741 Metatarsalgia, right foot: Secondary | ICD-10-CM | POA: Diagnosis not present

## 2024-07-23 ENCOUNTER — Telehealth: Payer: Self-pay | Admitting: Orthopaedic Surgery

## 2024-08-07 ENCOUNTER — Encounter: Payer: Self-pay | Admitting: Radiology

## 2024-08-31 DIAGNOSIS — B351 Tinea unguium: Secondary | ICD-10-CM | POA: Diagnosis not present

## 2024-08-31 DIAGNOSIS — L851 Acquired keratosis [keratoderma] palmaris et plantaris: Secondary | ICD-10-CM | POA: Diagnosis not present

## 2024-08-31 DIAGNOSIS — M79675 Pain in left toe(s): Secondary | ICD-10-CM | POA: Diagnosis not present

## 2024-08-31 DIAGNOSIS — M7742 Metatarsalgia, left foot: Secondary | ICD-10-CM | POA: Diagnosis not present

## 2024-08-31 DIAGNOSIS — M7741 Metatarsalgia, right foot: Secondary | ICD-10-CM | POA: Diagnosis not present

## 2024-08-31 DIAGNOSIS — M79674 Pain in right toe(s): Secondary | ICD-10-CM | POA: Diagnosis not present

## 2024-09-01 DIAGNOSIS — K219 Gastro-esophageal reflux disease without esophagitis: Secondary | ICD-10-CM | POA: Diagnosis not present

## 2024-09-01 DIAGNOSIS — Z79899 Other long term (current) drug therapy: Secondary | ICD-10-CM | POA: Diagnosis not present

## 2024-09-01 DIAGNOSIS — I10 Essential (primary) hypertension: Secondary | ICD-10-CM | POA: Diagnosis not present

## 2024-09-08 DIAGNOSIS — E785 Hyperlipidemia, unspecified: Secondary | ICD-10-CM | POA: Diagnosis not present

## 2024-09-08 DIAGNOSIS — Z23 Encounter for immunization: Secondary | ICD-10-CM | POA: Diagnosis not present

## 2024-09-08 DIAGNOSIS — K219 Gastro-esophageal reflux disease without esophagitis: Secondary | ICD-10-CM | POA: Diagnosis not present

## 2024-09-08 DIAGNOSIS — I10 Essential (primary) hypertension: Secondary | ICD-10-CM | POA: Diagnosis not present

## 2024-09-21 LAB — COLOGUARD

## 2024-09-30 ENCOUNTER — Encounter (INDEPENDENT_AMBULATORY_CARE_PROVIDER_SITE_OTHER): Payer: Self-pay | Admitting: Gastroenterology

## 2024-10-04 LAB — COLOGUARD

## 2024-10-15 LAB — COLOGUARD

## 2024-10-19 ENCOUNTER — Encounter: Payer: Self-pay | Admitting: Radiology

## 2024-12-07 DIAGNOSIS — Z1211 Encounter for screening for malignant neoplasm of colon: Secondary | ICD-10-CM | POA: Diagnosis not present

## 2024-12-14 ENCOUNTER — Other Ambulatory Visit: Payer: Self-pay | Admitting: Orthopedic Surgery

## 2024-12-14 NOTE — Telephone Encounter (Signed)
 Refill request received via fax for  Flexeril   Formerly Dr Brenna patient

## 2024-12-15 MED ORDER — CYCLOBENZAPRINE HCL 10 MG PO TABS: 10.0000 mg | ORAL_TABLET | Freq: Every day | ORAL | 2 refills | Status: AC

## 2024-12-30 ENCOUNTER — Encounter (INDEPENDENT_AMBULATORY_CARE_PROVIDER_SITE_OTHER): Payer: Self-pay | Admitting: *Deleted
